# Patient Record
Sex: Female | Born: 1945
Health system: Southern US, Community
[De-identification: ages and names within clinical notes are randomized; demographics above are authoritative.]

## PROBLEM LIST (undated history)

## (undated) DIAGNOSIS — L570 Actinic keratosis: Secondary | ICD-10-CM

## (undated) DIAGNOSIS — R3915 Urgency of urination: Secondary | ICD-10-CM

## (undated) DIAGNOSIS — E785 Hyperlipidemia, unspecified: Secondary | ICD-10-CM

## (undated) DIAGNOSIS — C4491 Basal cell carcinoma of skin, unspecified: Secondary | ICD-10-CM

## (undated) DIAGNOSIS — S42302A Unspecified fracture of shaft of humerus, left arm, initial encounter for closed fracture: Secondary | ICD-10-CM

## (undated) DIAGNOSIS — I499 Cardiac arrhythmia, unspecified: Secondary | ICD-10-CM

## (undated) DIAGNOSIS — Z923 Personal history of irradiation: Secondary | ICD-10-CM

## (undated) DIAGNOSIS — M1990 Unspecified osteoarthritis, unspecified site: Secondary | ICD-10-CM

## (undated) DIAGNOSIS — Z87442 Personal history of urinary calculi: Secondary | ICD-10-CM

## (undated) DIAGNOSIS — C50919 Malignant neoplasm of unspecified site of unspecified female breast: Secondary | ICD-10-CM

## (undated) HISTORY — DX: Actinic keratosis: L57.0

## (undated) HISTORY — DX: Personal history of irradiation: Z92.3

## (undated) HISTORY — DX: Hyperlipidemia, unspecified: E78.5

## (undated) HISTORY — DX: Unspecified fracture of shaft of humerus, left arm, initial encounter for closed fracture: S42.302A

## (undated) HISTORY — DX: Basal cell carcinoma of skin, unspecified: C44.91

## (undated) HISTORY — DX: Urgency of urination: R39.15

## (undated) HISTORY — DX: Malignant neoplasm of unspecified site of unspecified female breast: C50.919

---

## 1967-11-01 DIAGNOSIS — S42302A Unspecified fracture of shaft of humerus, left arm, initial encounter for closed fracture: Secondary | ICD-10-CM

## 1967-11-01 HISTORY — DX: Unspecified fracture of shaft of humerus, left arm, initial encounter for closed fracture: S42.302A

## 1983-11-01 HISTORY — PX: VAGINAL HYSTERECTOMY: SUR661

## 1997-12-01 ENCOUNTER — Ambulatory Visit (HOSPITAL_COMMUNITY): Admission: RE | Admit: 1997-12-01 | Discharge: 1997-12-01 | Payer: Self-pay | Admitting: Family Medicine

## 1997-12-09 ENCOUNTER — Ambulatory Visit (HOSPITAL_COMMUNITY): Admission: RE | Admit: 1997-12-09 | Discharge: 1997-12-09 | Payer: Self-pay | Admitting: Family Medicine

## 1999-01-20 ENCOUNTER — Ambulatory Visit (HOSPITAL_COMMUNITY): Admission: RE | Admit: 1999-01-20 | Discharge: 1999-01-20 | Payer: Self-pay | Admitting: Family Medicine

## 2000-01-31 ENCOUNTER — Ambulatory Visit (HOSPITAL_COMMUNITY): Admission: RE | Admit: 2000-01-31 | Discharge: 2000-01-31 | Payer: Self-pay | Admitting: Family Medicine

## 2000-01-31 ENCOUNTER — Encounter: Payer: Self-pay | Admitting: Family Medicine

## 2001-02-08 ENCOUNTER — Encounter: Payer: Self-pay | Admitting: Family Medicine

## 2001-02-08 ENCOUNTER — Ambulatory Visit (HOSPITAL_COMMUNITY): Admission: RE | Admit: 2001-02-08 | Discharge: 2001-02-08 | Payer: Self-pay | Admitting: Family Medicine

## 2002-03-19 ENCOUNTER — Encounter: Payer: Self-pay | Admitting: Family Medicine

## 2002-03-19 ENCOUNTER — Ambulatory Visit (HOSPITAL_COMMUNITY): Admission: RE | Admit: 2002-03-19 | Discharge: 2002-03-19 | Payer: Self-pay | Admitting: Family Medicine

## 2003-03-07 ENCOUNTER — Ambulatory Visit (HOSPITAL_COMMUNITY): Admission: RE | Admit: 2003-03-07 | Discharge: 2003-03-07 | Payer: Self-pay | Admitting: Family Medicine

## 2003-03-07 ENCOUNTER — Encounter: Payer: Self-pay | Admitting: Family Medicine

## 2005-04-27 ENCOUNTER — Ambulatory Visit (HOSPITAL_COMMUNITY): Admission: RE | Admit: 2005-04-27 | Discharge: 2005-04-27 | Payer: Self-pay | Admitting: Family Medicine

## 2006-05-04 ENCOUNTER — Ambulatory Visit (HOSPITAL_COMMUNITY): Admission: RE | Admit: 2006-05-04 | Discharge: 2006-05-04 | Payer: Self-pay | Admitting: Family Medicine

## 2007-06-21 ENCOUNTER — Ambulatory Visit (HOSPITAL_COMMUNITY): Admission: RE | Admit: 2007-06-21 | Discharge: 2007-06-21 | Payer: Self-pay | Admitting: Family Medicine

## 2008-06-23 ENCOUNTER — Ambulatory Visit (HOSPITAL_COMMUNITY): Admission: RE | Admit: 2008-06-23 | Discharge: 2008-06-23 | Payer: Self-pay | Admitting: Family Medicine

## 2008-07-21 ENCOUNTER — Ambulatory Visit: Payer: Self-pay | Admitting: Unknown Physician Specialty

## 2009-05-11 DIAGNOSIS — C4491 Basal cell carcinoma of skin, unspecified: Secondary | ICD-10-CM

## 2009-05-11 HISTORY — DX: Basal cell carcinoma of skin, unspecified: C44.91

## 2009-08-04 ENCOUNTER — Ambulatory Visit (HOSPITAL_COMMUNITY): Admission: RE | Admit: 2009-08-04 | Discharge: 2009-08-04 | Payer: Self-pay | Admitting: Family Medicine

## 2010-09-09 ENCOUNTER — Ambulatory Visit (HOSPITAL_COMMUNITY): Admission: RE | Admit: 2010-09-09 | Discharge: 2010-09-09 | Payer: Self-pay | Admitting: Family Medicine

## 2011-11-01 DIAGNOSIS — Z923 Personal history of irradiation: Secondary | ICD-10-CM

## 2011-11-01 HISTORY — DX: Personal history of irradiation: Z92.3

## 2011-11-03 ENCOUNTER — Other Ambulatory Visit (HOSPITAL_COMMUNITY): Payer: Self-pay | Admitting: Family Medicine

## 2011-11-03 DIAGNOSIS — Z1231 Encounter for screening mammogram for malignant neoplasm of breast: Secondary | ICD-10-CM

## 2011-12-01 ENCOUNTER — Ambulatory Visit (HOSPITAL_COMMUNITY)
Admission: RE | Admit: 2011-12-01 | Discharge: 2011-12-01 | Disposition: A | Discharge status: Home / Self Care | Payer: BC Managed Care – PPO | Source: Ambulatory Visit | Attending: Family Medicine | Admitting: Family Medicine

## 2011-12-01 DIAGNOSIS — Z1231 Encounter for screening mammogram for malignant neoplasm of breast: Secondary | ICD-10-CM | POA: Insufficient documentation

## 2011-12-08 ENCOUNTER — Other Ambulatory Visit: Payer: Self-pay | Admitting: Family Medicine

## 2011-12-08 DIAGNOSIS — R928 Other abnormal and inconclusive findings on diagnostic imaging of breast: Secondary | ICD-10-CM

## 2011-12-15 ENCOUNTER — Other Ambulatory Visit: Payer: Self-pay | Admitting: Family Medicine

## 2011-12-15 ENCOUNTER — Ambulatory Visit
Admission: RE | Admit: 2011-12-15 | Discharge: 2011-12-15 | Disposition: A | Discharge status: Home / Self Care | Payer: BC Managed Care – PPO | Source: Ambulatory Visit | Attending: Family Medicine | Admitting: Family Medicine

## 2011-12-15 DIAGNOSIS — R928 Other abnormal and inconclusive findings on diagnostic imaging of breast: Secondary | ICD-10-CM

## 2011-12-15 HISTORY — PX: BREAST BIOPSY: SHX20

## 2011-12-16 ENCOUNTER — Other Ambulatory Visit: Payer: Self-pay | Admitting: Family Medicine

## 2011-12-16 DIAGNOSIS — C50912 Malignant neoplasm of unspecified site of left female breast: Secondary | ICD-10-CM

## 2011-12-21 ENCOUNTER — Ambulatory Visit
Admission: RE | Admit: 2011-12-21 | Discharge: 2011-12-21 | Disposition: A | Discharge status: Home / Self Care | Payer: BC Managed Care – PPO | Source: Ambulatory Visit | Attending: Family Medicine | Admitting: Family Medicine

## 2011-12-21 DIAGNOSIS — C50912 Malignant neoplasm of unspecified site of left female breast: Secondary | ICD-10-CM

## 2011-12-21 MED ORDER — GADOBENATE DIMEGLUMINE 529 MG/ML IV SOLN
16.0000 mL | Freq: Once | INTRAVENOUS | Status: AC | PRN
  Administered 2011-12-21: 16 mL via INTRAVENOUS

## 2011-12-26 ENCOUNTER — Ambulatory Visit (INDEPENDENT_AMBULATORY_CARE_PROVIDER_SITE_OTHER): Payer: BC Managed Care – PPO | Admitting: General Surgery

## 2011-12-26 ENCOUNTER — Encounter (INDEPENDENT_AMBULATORY_CARE_PROVIDER_SITE_OTHER): Payer: Self-pay | Admitting: General Surgery

## 2011-12-26 VITALS — BP 114/80 | HR 68 | Temp 97.4°F | Resp 16 | Ht 65.0 in | Wt 175.6 lb

## 2011-12-26 DIAGNOSIS — C50419 Malignant neoplasm of upper-outer quadrant of unspecified female breast: Secondary | ICD-10-CM

## 2011-12-26 NOTE — Progress Notes (Signed)
Patient ID: Krystal Payne, female   DOB: 11/20/1945, 66 y.o.   MRN: [id number]  Chief Complaint  Patient presents with  . Breast Cancer    eval L breast    HPI Krystal Payne is a 66 y.o. female.  Referred by Dr. Rubner HPI This is a healthy 66-year-old female who presents after undergoing her screening mammogram with a left breast mass that was found. This was 1 cm in size on followup diagnostic view. Ultrasound showed her to have a 1.1 x 0.8 x 0.7 cm mass. She underwent ultrasound-guided biopsy with clip placement. This ends up being a grade 1-2 invasive ductal carcinoma. This is HER-2/neu negative, ER positive at 100%, PR positive at 100%, and proliferation markers 6%. She has also undergone an MRI which shows a solitary 11 mm left breast posterior third invasive cancer. There is no MR evidence of malignancy in the right breast or any abnormal lymph nodes. There is a 9 mm left hepatic lobe lesion likely a benign hepatic cyst. She comes in today without any complaints referable to her breast to discuss different options for treatment. Past Medical History  Diagnosis Date  . Hyperlipidemia     Past Surgical History  Procedure Date  . Vaginal hysterectomy 1980    Family History  Problem Relation Age of Onset  . Heart disease Mother   . Heart disease Sister   . Cancer Brother     colon  . Heart disease Brother   . Cancer Maternal Aunt     breast    Social History History  Substance Use Topics  . Smoking status: Former Smoker    Quit date: 10/31/1968  . Smokeless tobacco: Not on file  . Alcohol Use: Yes    No Known Allergies  Current Outpatient Prescriptions  Medication Sig Dispense Refill  . oxybutynin (DITROPAN) 5 MG tablet daily.      . simvastatin (ZOCOR) 40 MG tablet 20 mg daily.        Review of Systems Review of Systems  Constitutional: Negative for fever, chills and unexpected weight change.  HENT: Negative for hearing loss, congestion, sore throat,  trouble swallowing and voice change.   Eyes: Negative for visual disturbance.  Respiratory: Negative for cough and wheezing.   Cardiovascular: Negative for chest pain, palpitations and leg swelling.  Gastrointestinal: Negative for nausea, vomiting, abdominal pain, diarrhea, constipation, blood in stool, abdominal distention and anal bleeding.  Genitourinary: Negative for hematuria, vaginal bleeding and difficulty urinating.  Musculoskeletal: Positive for arthralgias.  Skin: Negative for rash and wound.  Neurological: Negative for seizures, syncope and headaches.  Hematological: Negative for adenopathy. Does not bruise/bleed easily.  Psychiatric/Behavioral: Negative for confusion.    Blood pressure 114/80, pulse 68, temperature 97.4 F (36.3 C), temperature source Temporal, resp. rate 16, height 5' 5" (1.651 m), weight 175 lb 9.6 oz (79.652 kg).  Physical Exam Physical Exam  Vitals reviewed. Constitutional: She appears well-developed and well-nourished.  Neck: Neck supple.  Cardiovascular: Normal rate, regular rhythm and normal heart sounds.   Pulmonary/Chest: Effort normal and breath sounds normal. She has no wheezes. She has no rales. Right breast exhibits no inverted nipple, no mass, no nipple discharge, no skin change and no tenderness. Left breast exhibits no inverted nipple, no mass, no nipple discharge, no skin change and no tenderness. Breasts are symmetrical.    Abdominal: Soft. There is no hepatomegaly.  Lymphadenopathy:    She has no cervical adenopathy.    Data Reviewed BILATERAL BREAST   MRI WITH AND WITHOUT CONTRAST  Technique: Multiplanar, multisequence MR images of both breasts  were obtained prior to and following the intravenous administration  of 16ml of Multihance. Three dimensional images were evaluated at  the independent DynaCad workstation.  Comparison: Bilateral mammogram 12/01/2011, left diagnostic  mammogram, left breast ultrasound and, and post clip  mammogram  12/15/2011.  Findings: There is a minimal background enhancement pattern.  In the posterior third of the outer left breast, at the level of  the nipple, is a solitary 11 x 9 x 9 mm oval mass with indistinct  margins and washout kinetics. There is some surrounding biopsy  change. Biopsy clip artifact is seen centrally within the mass.  No additional areas of suspicious enhancement are identified in the  left breast.  No mass or suspicious enhancement is identified in the right breast  to suggest malignancy.  Negative for axillary or internal mammary chain lymphadenopathy.  In the left lobe of the liver is a circumscribed oval 9 mm T2  bright lesion that does not enhance. No prior abdominal imaging is  available for comparison.  IMPRESSION:  1. Solitary 11 mm biopsy-proven invasive mammary carcinoma in the  3 o'clock position of the left breast, posterior third.  2. No MRI evidence of malignancy in the right breast.  3. 9 mm T2 bright non-enhancing circumscribed left hepatic lobe  lesion. This is most likely a benign hepatic cyst. No prior  abdominal imaging available for comparison.   Assessment    Clinical stage I left breast cancer    Plan    We planned for left breast wire guided lumpectomy, left ax snbx, possible mammosite.  I think even though posterior third she would be reasonable candidate for mammosite and we discussed possibility of technically or due to pathology not being able to complete, long term seroma.  Will have her see xrt before scheduling.      We discussed the staging and pathophysiology of breast cancer. We discussed all of the different options for treatment for breast cancer including surgery, chemotherapy, radiation therapy, Herceptin, and antiestrogen therapy.   We discussed a sentinel lymph node biopsy as she does not appear to having lymph node involvement right now. We discussed the performance of that with injection of radioactive tracer  and blue dye. We discussed that she would have an incision underneath her axillary hairline. We discussed that there is a bout a 10-20% chance of having a positive node with a sentinel lymph node biopsy and we will await the permanent pathology to make any other first further decisions in terms of her treatment. One of these options might be to return to the operating room to perform an axillary lymph node dissection. We discussed about a 1-2% risk lifetime of chronic shoulder pain as well as lymphedema associated with a sentinel lymph node biopsy.  We discussed the options for treatment of the breast cancer which included lumpectomy versus a mastectomy. We discussed the performance of the lumpectomy with a wire placement. We discussed a 10-20% chance of a positive margin requiring reexcision in the operating room. We also discussed that she may need radiation therapy or antiestrogen therapy or both if she undergoes lumpectomy. We discussed the mastectomy and the postoperative care for that as well. We discussed that there is no difference in her survival whether she undergoes lumpectomy with radiation therapy or antiestrogen therapy versus a mastectomy. There is a slight difference in the local recurrence rate being 3-5% with lumpectomy and   about 1% with a mastectomy. We discussed the risks of operation including bleeding, infection, possible reoperation. She understands her further therapy will be based on what her stages at the time of her operation.      Burris Matherne 12/26/2011, 1:23 PM    

## 2011-12-27 ENCOUNTER — Other Ambulatory Visit (INDEPENDENT_AMBULATORY_CARE_PROVIDER_SITE_OTHER): Payer: Self-pay | Admitting: General Surgery

## 2011-12-28 ENCOUNTER — Telehealth (INDEPENDENT_AMBULATORY_CARE_PROVIDER_SITE_OTHER): Payer: Self-pay

## 2011-12-28 NOTE — Telephone Encounter (Signed)
Called [facility] to see about the pt being seen with Dr Dayton Scrape since Dr Michell Heinrich is out of the office and Dr Rennie Plowman staff is working on this right now. The pt might be seen tomorrow and they will call the pt ASAP as soon as they get a time.

## 2011-12-29 ENCOUNTER — Other Ambulatory Visit (INDEPENDENT_AMBULATORY_CARE_PROVIDER_SITE_OTHER): Payer: Self-pay | Admitting: General Surgery

## 2011-12-29 ENCOUNTER — Encounter (HOSPITAL_COMMUNITY): Payer: Self-pay | Admitting: Pharmacy Technician

## 2011-12-29 ENCOUNTER — Ambulatory Visit
Admission: RE | Admit: 2011-12-29 | Discharge: 2011-12-29 | Disposition: A | Discharge status: Home / Self Care | Payer: BC Managed Care – PPO | Source: Ambulatory Visit | Attending: Radiation Oncology | Admitting: Radiation Oncology

## 2011-12-29 ENCOUNTER — Encounter: Payer: Self-pay | Admitting: Radiation Oncology

## 2011-12-29 VITALS — BP 119/74 | HR 78 | Temp 97.6°F | Resp 18 | Ht 66.0 in | Wt 177.6 lb

## 2011-12-29 DIAGNOSIS — L988 Other specified disorders of the skin and subcutaneous tissue: Secondary | ICD-10-CM | POA: Insufficient documentation

## 2011-12-29 DIAGNOSIS — E785 Hyperlipidemia, unspecified: Secondary | ICD-10-CM | POA: Insufficient documentation

## 2011-12-29 DIAGNOSIS — C50419 Malignant neoplasm of upper-outer quadrant of unspecified female breast: Secondary | ICD-10-CM

## 2011-12-29 DIAGNOSIS — Z90710 Acquired absence of both cervix and uterus: Secondary | ICD-10-CM | POA: Insufficient documentation

## 2011-12-29 DIAGNOSIS — C50919 Malignant neoplasm of unspecified site of unspecified female breast: Secondary | ICD-10-CM | POA: Insufficient documentation

## 2011-12-29 DIAGNOSIS — Z79899 Other long term (current) drug therapy: Secondary | ICD-10-CM | POA: Insufficient documentation

## 2011-12-29 DIAGNOSIS — Z87891 Personal history of nicotine dependence: Secondary | ICD-10-CM | POA: Insufficient documentation

## 2011-12-29 DIAGNOSIS — Z170 Estrogen receptor positive status [ER+]: Secondary | ICD-10-CM | POA: Insufficient documentation

## 2011-12-29 DIAGNOSIS — Z803 Family history of malignant neoplasm of breast: Secondary | ICD-10-CM | POA: Insufficient documentation

## 2011-12-29 DIAGNOSIS — Z510 Encounter for antineoplastic radiation therapy: Secondary | ICD-10-CM | POA: Insufficient documentation

## 2011-12-29 NOTE — Progress Notes (Signed)
[facility] Radiation Oncology NEW PATIENT EVALUATION  Name: Krystal Payne MRN: [id number]  Date: 12/29/2011  DOB: [birth year]  Status: outpatient   CC: Dorothey Baseman, MD, MD  Emelia Loron, MD    REFERRING PHYSICIAN: Emelia Loron, MD   DIAGNOSIS: Clinical stage I (T1b, N0, M0) invasive ductal carcinoma of the left breast   HISTORY OF PRESENT ILLNESS:  Krystal Payne is a 66 y.o. female who is seen today through the courtesy of Dr. Emelia Loron for consideration of partial breast radiation/MammoSite HDR following conservative surgery for her T1 N0 invasive ductal carcinoma of the left breast. At the time of a screening mammogram at the [facility] hospital on 12/01/2011 Susana have a possible mass within the left breast. Additional views and ultrasound at the breast Center on 12/15/2011 showed a 1.1 x 0.8 x 0.7 cm mass at 3:00, 7 cm from the nipple. An ultrasound guided biopsy on 12/15/2011 was diagnostic for invasive ductal carcinoma which was strongly ER/PR positive at 100% with a low proliferation marker of 6%. HER-2/neu was not amplified. Best MR on 12/21/2011 showed a 1.1 x 0.9 x 0.9 cm mass within the posterior third of the abdomen left breast. Biopsy change and clip artifact were seen. There were no other suspicious lesions. She is interested in MammoSite HDR brachytherapy.   PREVIOUS RADIATION THERAPY: No   PAST MEDICAL HISTORY:  has a past medical history of Hyperlipidemia and Breast cancer.     PAST SURGICAL HISTORY:  Past Surgical History  Procedure Date  . Vaginal hysterectomy 1980  . Breast biopsy     left     FAMILY HISTORY: family history includes Cancer in her brother and maternal aunt and Heart disease in her brother, mother, and sister. Her mother died from sepsis following a CVA at age 51. Her father died from complications of Alzheimer's disease at 43. A maternal aunt was diagnosed with breast cancer at age 13, and is currently  alive and well at age 43.   SOCIAL HISTORY:  reports that she quit smoking about 43 years ago. Her smoking use included Cigarettes. She has a .5 pack-year smoking history. She does not have any smokeless tobacco history on file. She reports that she drinks alcohol. She reports that she does not use illicit drugs. She worked as a IT consultant. Married, 2 children.   ALLERGIES: Review of patient's allergies indicates no known allergies.   MEDICATIONS:  Current Outpatient Prescriptions  Medication Sig Dispense Refill  . oxybutynin (DITROPAN) 5 MG tablet daily.      . simvastatin (ZOCOR) 40 MG tablet 20 mg daily.          REVIEW OF SYSTEMS:  Pertinent items are noted in HPI.    PHYSICAL EXAM:  height is 5\' 6"  (1.676 m) and weight is 177 lb 9.6 oz (80.559 kg). Her oral temperature is 97.6 F (36.4 C). Her blood pressure is 119/74 and her pulse is 78. Her respiration is 18 and oxygen saturation is 100%.   Head and neck examination: Grossly unremarkable. Nodes: Without palpable cervical, supraclavicular, or axillary lymphadenopathy. Chest: Lungs clear. Back: Without spinal or CVA tenderness. Heart regular in rhythm. Breasts: There is a punctate needle biopsy wound at approximately 3:00 along the left breast. There is ill-defined was in her mass also at 3:00 more medial and deep within the left breast. Right breast is without masses or lesions. Abdomen without hepatomegaly. Extremities without edema.   LABORATORY DATA:  No results found for this basename:  WBC, HGB, HCT, MCV, PLT   No results found for this basename: NA, K, CL, CO2   No results found for this basename: ALT, AST, GGT, ALKPHOS, BILITOT      IMPRESSION: Clinical stage I (T1b N0 M0) invasive ductal carcinoma of the left breast. I explained to the patient that her local treatment options include mastectomy versus partial mastectomy followed by radiation therapy. Radiation therapy options include conventional whole breast radiation  or partial breast radiation. Partial breast radiation options include MammoSite HDR brachytherapy. On review of her MRI scan, she appears to be an excellent candidate for MammoSite brachytherapy based on location of her primary tumor. We discussed MammoSite brachytherapy in great detail and  the potential acute and late toxicities.Consent was signed today. I spoke with Dr. Dwain Sarna will get her scheduled for surgery.  PLAN: As discussed above. She is also given literature and a DVD for review.   I spent 60 minutes minutes face to face with the patient and more than 50% of that time was spent in counseling and/or coordination of care.

## 2011-12-29 NOTE — Progress Notes (Signed)
66 year old female.   Annual screening mammogram revealed abnormality in left breast. Ultrasound showed 1.1x0.8x0.7 cm mass. Underwent ultrasound biopsy and clip placement on 12/15/2011. Pathology revealed grade 1-2 invasive ductal carcinoma ER, PR positive and HER-2 negative. MRI done 12/21/2011 shows a solitary 11 mm left breast posterior third invasive cancer. No evidence of malignancy in right breast. 9 mm left hepatic lesion likely benign. Left breast wire lumpectomy with SLN evaluation and possible mammosite planned.   NKDA No indication of a pacemaker No hx of XRT

## 2011-12-29 NOTE — Progress Notes (Signed)
Please see the Nurse Progress Note in the MD Initial Consult Encounter for this patient. 

## 2011-12-29 NOTE — Progress Notes (Signed)
Complete PATIENT MEASURE OF DISTRESS worksheet with a score of 0 submitted to social work. Also, complete NUTRITION RISK SCREEN worksheet without concerns submitted to Barbara Neff, RD 

## 2011-12-29 NOTE — Progress Notes (Signed)
Chart note:  Dr. Dwain Sarna has the patient scheduled for surgery and placement of a CED on Monday, March 4. I'm getting her scheduled for her CED check on Tuesday, March 5. Assuming that she remains a candidate for MammoSite HDR brachytherapy, Dr. Dwain Sarna can change the CED for the MammoSite catheter on Wednesday, March 6. We'll then have her visit [place] for formal simulation/treatment planning on Thursday, March 7 and begin her treatment either Friday, March 8 or Monday, March 11 with completion of therapy on Friday, March 15.

## 2011-12-29 NOTE — Progress Notes (Signed)
Patient presents to the clinic today for a consultation with Dr. Dayton Scrape reference XRT for breast ca. Patient is alert and oriented to person, place, and time. No distress noted. Steady gait noted. Pleasant affect noted. Patient denies pain at this time. Patient has no complaints. Patient states,"I don't even feel sick." Patient denies nausea, vomiting, diarrhea, headache, or dizziness. Reported all findings to Dr. Dayton Scrape.

## 2011-12-29 NOTE — Progress Notes (Signed)
Encounter addended by: Ardell Isaacs, RN on: 12/29/2011 12:35 PM<BR>     Documentation filed: Charges VN, Inpatient Patient Education

## 2011-12-30 ENCOUNTER — Encounter (HOSPITAL_COMMUNITY): Payer: Self-pay | Admitting: *Deleted

## 2012-01-01 MED ORDER — CEFAZOLIN SODIUM 1-5 GM-% IV SOLN: 1.0000 g | INTRAVENOUS | Status: DC

## 2012-01-02 ENCOUNTER — Ambulatory Visit
Admission: RE | Admit: 2012-01-02 | Discharge: 2012-01-02 | Disposition: A | Discharge status: Home / Self Care | Payer: BC Managed Care – PPO | Source: Ambulatory Visit | Attending: General Surgery | Admitting: General Surgery

## 2012-01-02 ENCOUNTER — Other Ambulatory Visit: Payer: Self-pay

## 2012-01-02 ENCOUNTER — Ambulatory Visit (HOSPITAL_COMMUNITY): Payer: BC Managed Care – PPO | Admitting: Anesthesiology

## 2012-01-02 ENCOUNTER — Ambulatory Visit (HOSPITAL_COMMUNITY)
Admission: RE | Admit: 2012-01-02 | Discharge: 2012-01-02 | Disposition: A | Discharge status: Home / Self Care | Payer: BC Managed Care – PPO | Source: Ambulatory Visit | Attending: General Surgery | Admitting: General Surgery

## 2012-01-02 ENCOUNTER — Encounter (HOSPITAL_COMMUNITY)
Admission: RE | Disposition: A | Discharge status: Home / Self Care | Payer: Self-pay | Source: Ambulatory Visit | Attending: General Surgery

## 2012-01-02 ENCOUNTER — Encounter (HOSPITAL_COMMUNITY): Payer: Self-pay | Admitting: *Deleted

## 2012-01-02 ENCOUNTER — Encounter (HOSPITAL_COMMUNITY): Payer: Self-pay | Admitting: Anesthesiology

## 2012-01-02 ENCOUNTER — Other Ambulatory Visit (INDEPENDENT_AMBULATORY_CARE_PROVIDER_SITE_OTHER): Payer: Self-pay | Admitting: General Surgery

## 2012-01-02 ENCOUNTER — Ambulatory Visit (HOSPITAL_COMMUNITY): Payer: BC Managed Care – PPO

## 2012-01-02 DIAGNOSIS — C50419 Malignant neoplasm of upper-outer quadrant of unspecified female breast: Secondary | ICD-10-CM

## 2012-01-02 DIAGNOSIS — Z170 Estrogen receptor positive status [ER+]: Secondary | ICD-10-CM | POA: Insufficient documentation

## 2012-01-02 DIAGNOSIS — C50919 Malignant neoplasm of unspecified site of unspecified female breast: Secondary | ICD-10-CM

## 2012-01-02 DIAGNOSIS — C50912 Malignant neoplasm of unspecified site of left female breast: Secondary | ICD-10-CM

## 2012-01-02 DIAGNOSIS — E785 Hyperlipidemia, unspecified: Secondary | ICD-10-CM | POA: Insufficient documentation

## 2012-01-02 HISTORY — PX: BREAST LUMPECTOMY: SHX2

## 2012-01-02 LAB — CBC
MCH: 31 pg (ref 26.0–34.0)
MCHC: 33.9 g/dL (ref 30.0–36.0)
Platelets: 171 10*3/uL (ref 150–400)
RDW: 12.6 % (ref 11.5–15.5)

## 2012-01-02 LAB — COMPREHENSIVE METABOLIC PANEL
ALT: 18 U/L (ref 0–35)
AST: 17 U/L (ref 0–37)
Calcium: 9.6 mg/dL (ref 8.4–10.5)
Sodium: 142 mEq/L (ref 135–145)
Total Protein: 7.1 g/dL (ref 6.0–8.3)

## 2012-01-02 LAB — SURGICAL PCR SCREEN: Staphylococcus aureus: NEGATIVE

## 2012-01-02 SURGERY — BREAST LUMPECTOMY WITH NEEDLE LOCALIZATION AND AXILLARY SENTINEL LYMPH NODE BX
Anesthesia: General | Site: Breast | Laterality: Left | Wound class: Clean

## 2012-01-02 MED ORDER — ACETAMINOPHEN 650 MG RE SUPP: 650.0000 mg | RECTAL | Status: DC | PRN

## 2012-01-02 MED ORDER — MORPHINE SULFATE 2 MG/ML IJ SOLN: 2.0000 mg | INTRAMUSCULAR | Status: DC | PRN

## 2012-01-02 MED ORDER — TECHNETIUM TC 99M SULFUR COLLOID FILTERED
1.0000 | Freq: Once | INTRAVENOUS | Status: AC | PRN
  Administered 2012-01-02: 1 via INTRADERMAL

## 2012-01-02 MED ORDER — IOHEXOL 300 MG/ML  SOLN
INTRAMUSCULAR | Status: DC | PRN
  Administered 2012-01-02: 10 mL via INTRAVENOUS

## 2012-01-02 MED ORDER — FENTANYL CITRATE 0.05 MG/ML IJ SOLN
INTRAMUSCULAR | Status: AC
  Filled 2012-01-02: qty 2

## 2012-01-02 MED ORDER — ONDANSETRON HCL 4 MG/2ML IJ SOLN: 4.0000 mg | Freq: Four times a day (QID) | INTRAMUSCULAR | Status: DC | PRN

## 2012-01-02 MED ORDER — SODIUM CHLORIDE 0.9 % IJ SOLN
INTRAMUSCULAR | Status: DC | PRN
  Administered 2012-01-02: 11:00:00

## 2012-01-02 MED ORDER — GLYCOPYRROLATE 0.2 MG/ML IJ SOLN
INTRAMUSCULAR | Status: DC | PRN
  Administered 2012-01-02: .6 mg via INTRAVENOUS

## 2012-01-02 MED ORDER — FENTANYL CITRATE 0.05 MG/ML IJ SOLN
INTRAMUSCULAR | Status: DC | PRN
  Administered 2012-01-02 (×2): 50 ug via INTRAVENOUS
  Administered 2012-01-02: 100 ug via INTRAVENOUS
  Administered 2012-01-02 (×2): 50 ug via INTRAVENOUS
  Administered 2012-01-02: 25 ug via INTRAVENOUS

## 2012-01-02 MED ORDER — OXYCODONE-ACETAMINOPHEN 5-325 MG PO TABS: 1.0000 | ORAL_TABLET | Freq: Four times a day (QID) | ORAL | Status: DC | PRN

## 2012-01-02 MED ORDER — 0.9 % SODIUM CHLORIDE (POUR BTL) OPTIME
TOPICAL | Status: DC | PRN
  Administered 2012-01-02: 1000 mL

## 2012-01-02 MED ORDER — MIDAZOLAM HCL 5 MG/5ML IJ SOLN
INTRAMUSCULAR | Status: DC | PRN
  Administered 2012-01-02: 2 mg via INTRAVENOUS

## 2012-01-02 MED ORDER — ROCURONIUM BROMIDE 100 MG/10ML IV SOLN
INTRAVENOUS | Status: DC | PRN
  Administered 2012-01-02: 40 mg via INTRAVENOUS

## 2012-01-02 MED ORDER — SODIUM CHLORIDE 0.9 % IJ SOLN: 3.0000 mL | Freq: Two times a day (BID) | INTRAMUSCULAR | Status: DC

## 2012-01-02 MED ORDER — ONDANSETRON HCL 4 MG/2ML IJ SOLN
INTRAMUSCULAR | Status: DC | PRN
  Administered 2012-01-02: 4 mg via INTRAVENOUS

## 2012-01-02 MED ORDER — ACETAMINOPHEN 325 MG PO TABS: 650.0000 mg | ORAL_TABLET | ORAL | Status: DC | PRN

## 2012-01-02 MED ORDER — CEFAZOLIN SODIUM-DEXTROSE 2-3 GM-% IV SOLR
2.0000 g | INTRAVENOUS | Status: AC
  Administered 2012-01-02: 2 g via INTRAVENOUS

## 2012-01-02 MED ORDER — MIDAZOLAM HCL 2 MG/2ML IJ SOLN
INTRAMUSCULAR | Status: AC
Start: 2012-01-02 — End: 2012-01-02
  Filled 2012-01-02: qty 2

## 2012-01-02 MED ORDER — HYDROMORPHONE HCL PF 1 MG/ML IJ SOLN
0.2500 mg | INTRAMUSCULAR | Status: DC | PRN
Start: 2012-01-02 — End: 2012-01-02
  Administered 2012-01-02: 0.5 mg via INTRAVENOUS

## 2012-01-02 MED ORDER — ONDANSETRON HCL 4 MG/2ML IJ SOLN: 4.0000 mg | Freq: Once | INTRAMUSCULAR | Status: DC | PRN

## 2012-01-02 MED ORDER — CEFAZOLIN SODIUM-DEXTROSE 2-3 GM-% IV SOLR
INTRAVENOUS | Status: AC
  Filled 2012-01-02: qty 50

## 2012-01-02 MED ORDER — SODIUM CHLORIDE 0.9 % IJ SOLN: 3.0000 mL | INTRAMUSCULAR | Status: DC | PRN

## 2012-01-02 MED ORDER — MUPIROCIN 2 % EX OINT
TOPICAL_OINTMENT | Freq: Two times a day (BID) | CUTANEOUS | Status: DC
  Administered 2012-01-02: 1 via NASAL
  Filled 2012-01-02: qty 22

## 2012-01-02 MED ORDER — MUPIROCIN 2 % EX OINT
TOPICAL_OINTMENT | CUTANEOUS | Status: AC
  Administered 2012-01-02: 1 via NASAL
  Filled 2012-01-02: qty 22

## 2012-01-02 MED ORDER — NEOSTIGMINE METHYLSULFATE 1 MG/ML IJ SOLN
INTRAMUSCULAR | Status: DC | PRN
  Administered 2012-01-02: 4 mg via INTRAVENOUS

## 2012-01-02 MED ORDER — OXYCODONE HCL 5 MG PO TABS: 5.0000 mg | ORAL_TABLET | ORAL | Status: DC | PRN

## 2012-01-02 MED ORDER — SODIUM CHLORIDE 0.9 % IV SOLN: 250.0000 mL | INTRAVENOUS | Status: DC | PRN

## 2012-01-02 MED ORDER — LACTATED RINGERS IV SOLN
INTRAVENOUS | Status: DC
  Administered 2012-01-02: 10:00:00 via INTRAVENOUS

## 2012-01-02 MED ORDER — LACTATED RINGERS IV SOLN
INTRAVENOUS | Status: DC | PRN
  Administered 2012-01-02: 10:00:00 via INTRAVENOUS

## 2012-01-02 MED ORDER — PROPOFOL 10 MG/ML IV EMUL
INTRAVENOUS | Status: DC | PRN
  Administered 2012-01-02: 200 mg via INTRAVENOUS
  Administered 2012-01-02: 50 mg via INTRAVENOUS

## 2012-01-02 MED ORDER — SODIUM CHLORIDE 0.9 % IV SOLN: INTRAVENOUS | Status: DC

## 2012-01-02 SURGICAL SUPPLY — 55 items
APPLIER CLIP 9.375 MED OPEN (MISCELLANEOUS) ×2
BENZOIN TINCTURE PRP APPL 2/3 (GAUZE/BANDAGES/DRESSINGS) ×2 IMPLANT
BINDER BREAST LRG (GAUZE/BANDAGES/DRESSINGS) IMPLANT
BINDER BREAST XLRG (GAUZE/BANDAGES/DRESSINGS) ×2 IMPLANT
BLADE SURG 10 STRL SS (BLADE) ×2 IMPLANT
BLADE SURG 15 STRL LF DISP TIS (BLADE) ×1 IMPLANT
BLADE SURG 15 STRL SS (BLADE) ×1
CANISTER SUCTION 2500CC (MISCELLANEOUS) ×2 IMPLANT
CHLORAPREP W/TINT 26ML (MISCELLANEOUS) ×2 IMPLANT
CLIP APPLIE 9.375 MED OPEN (MISCELLANEOUS) ×1 IMPLANT
CLOTH BEACON ORANGE TIMEOUT ST (SAFETY) ×2 IMPLANT
CONT SPEC STER OR (MISCELLANEOUS) ×2 IMPLANT
COVER PROBE W GEL 5X96 (DRAPES) ×2 IMPLANT
COVER SURGICAL LIGHT HANDLE (MISCELLANEOUS) ×2 IMPLANT
DECANTER SPIKE VIAL GLASS SM (MISCELLANEOUS) IMPLANT
DERMABOND ADVANCED (GAUZE/BANDAGES/DRESSINGS)
DERMABOND ADVANCED .7 DNX12 (GAUZE/BANDAGES/DRESSINGS) IMPLANT
DEVICE CAVITY EVALUATION 9031 (MISCELLANEOUS) ×2 IMPLANT
DEVICE DUBIN SPECIMEN MAMMOGRA (MISCELLANEOUS) ×2 IMPLANT
DRAPE CHEST BREAST 15X10 FENES (DRAPES) ×2 IMPLANT
DRSG TEGADERM 4X4.75 (GAUZE/BANDAGES/DRESSINGS) ×4 IMPLANT
ELECT CAUTERY BLADE 6.4 (BLADE) ×2 IMPLANT
ELECT REM PT RETURN 9FT ADLT (ELECTROSURGICAL) ×2
ELECTRODE REM PT RTRN 9FT ADLT (ELECTROSURGICAL) ×1 IMPLANT
GLOVE BIO SURGEON STRL SZ7 (GLOVE) ×6 IMPLANT
GLOVE BIOGEL PI IND STRL 7.0 (GLOVE) ×1 IMPLANT
GLOVE BIOGEL PI IND STRL 7.5 (GLOVE) ×1 IMPLANT
GLOVE BIOGEL PI INDICATOR 7.0 (GLOVE) ×1
GLOVE BIOGEL PI INDICATOR 7.5 (GLOVE) ×1
GOWN STRL NON-REIN LRG LVL3 (GOWN DISPOSABLE) ×6 IMPLANT
KIT BASIN OR (CUSTOM PROCEDURE TRAY) ×2 IMPLANT
KIT MARKER MARGIN INK (KITS) ×2 IMPLANT
KIT ROOM TURNOVER OR (KITS) ×2 IMPLANT
NEEDLE 18GX1X1/2 (RX/OR ONLY) (NEEDLE) ×2 IMPLANT
NEEDLE HYPO 25GX1X1/2 BEV (NEEDLE) ×4 IMPLANT
NS IRRIG 1000ML POUR BTL (IV SOLUTION) ×2 IMPLANT
PACK SURGICAL SETUP 50X90 (CUSTOM PROCEDURE TRAY) ×2 IMPLANT
PAD ARMBOARD 7.5X6 YLW CONV (MISCELLANEOUS) ×2 IMPLANT
PENCIL BUTTON HOLSTER BLD 10FT (ELECTRODE) ×2 IMPLANT
SPONGE GAUZE 4X4 12PLY (GAUZE/BANDAGES/DRESSINGS) ×2 IMPLANT
SPONGE LAP 18X18 X RAY DECT (DISPOSABLE) ×2 IMPLANT
STAPLER VISISTAT 35W (STAPLE) ×2 IMPLANT
STOCKINETTE IMPERVIOUS 9X36 MD (GAUZE/BANDAGES/DRESSINGS) IMPLANT
STRIP CLOSURE SKIN 1/2X4 (GAUZE/BANDAGES/DRESSINGS) ×2 IMPLANT
SUT MNCRL AB 4-0 PS2 18 (SUTURE) ×4 IMPLANT
SUT SILK 2 0 SH (SUTURE) IMPLANT
SUT VIC AB 2-0 SH 27 (SUTURE) ×4
SUT VIC AB 2-0 SH 27XBRD (SUTURE) ×4 IMPLANT
SUT VIC AB 3-0 SH 27 (SUTURE) ×2
SUT VIC AB 3-0 SH 27XBRD (SUTURE) ×2 IMPLANT
SYR CONTROL 10ML LL (SYRINGE) ×4 IMPLANT
TOWEL OR 17X24 6PK STRL BLUE (TOWEL DISPOSABLE) ×2 IMPLANT
TOWEL OR 17X26 10 PK STRL BLUE (TOWEL DISPOSABLE) ×2 IMPLANT
TUBE CONNECTING 12X1/4 (SUCTIONS) ×2 IMPLANT
YANKAUER SUCT BULB TIP NO VENT (SUCTIONS) ×2 IMPLANT

## 2012-01-02 NOTE — Transfer of Care (Signed)
Immediate Anesthesia Transfer of Care Note  Patient: Krystal Payne  Procedure(s) Performed: Procedure(s) (LRB): BREAST LUMPECTOMY WITH NEEDLE LOCALIZATION AND AXILLARY SENTINEL LYMPH NODE BX (Left)  Patient Location: PACU  Anesthesia Type: General  Level of Consciousness: awake, alert  and oriented  Airway & Oxygen Therapy: Patient Spontanous Breathing and Patient connected to nasal cannula oxygen  Post-op Assessment: Report given to PACU RN, Post -op Vital signs reviewed and stable and Patient moving all extremities  Post vital signs: Reviewed and stable  Complications: No apparent anesthesia complications

## 2012-01-02 NOTE — Anesthesia Preprocedure Evaluation (Signed)
Anesthesia Evaluation  Patient identified by MRN, date of birth, ID band Patient awake    Reviewed: Allergy & Precautions, H&P , NPO status , Patient's Chart, lab work & pertinent test results  Airway Mallampati: I TM Distance: >3 FB Neck ROM: Full    Dental  (+) Teeth Intact   Pulmonary  breath sounds clear to auscultation        Cardiovascular negative cardio ROS  Rhythm:Regular Rate:Normal     Neuro/Psych    GI/Hepatic negative GI ROS,   Endo/Other    Renal/GU      Musculoskeletal   Abdominal   Peds  Hematology   Anesthesia Other Findings   Reproductive/Obstetrics                           Anesthesia Physical Anesthesia Plan  ASA: III  Anesthesia Plan: General   Post-op Pain Management:    Induction: Intravenous  Airway Management Planned: LMA  Additional Equipment:   Intra-op Plan:   Post-operative Plan:   Informed Consent: I have reviewed the patients History and Physical, chart, labs and discussed the procedure including the risks, benefits and alternatives for the proposed anesthesia with the patient or authorized representative who has indicated his/her understanding and acceptance.   Dental advisory given  Plan Discussed with:   Anesthesia Plan Comments: (Plan GA with LMA  Wynetta Emery, MD)        Anesthesia Quick Evaluation

## 2012-01-02 NOTE — H&P (View-Only) (Signed)
Patient ID: Krystal Payne, female   DOB: 1946-07-20, 66 y.o.   MRN: [id number]  Chief Complaint  Patient presents with  . Breast Cancer    eval L breast    HPI Krystal Payne is a 66 y.o. female.  Referred by Dr. Roswell Nickel HPI This is a healthy 66 year old female who presents after undergoing her screening mammogram with a left breast mass that was found. This was 1 cm in size on followup diagnostic view. Ultrasound showed her to have a 1.1 x 0.8 x 0.7 cm mass. She underwent ultrasound-guided biopsy with clip placement. This ends up being a grade 1-2 invasive ductal carcinoma. This is HER-2/neu negative, ER positive at 100%, PR positive at 100%, and proliferation markers 6%. She has also undergone an MRI which shows a solitary 11 mm left breast posterior third invasive cancer. There is no MR evidence of malignancy in the right breast or any abnormal lymph nodes. There is a 9 mm left hepatic lobe lesion likely a benign hepatic cyst. She comes in today without any complaints referable to her breast to discuss different options for treatment. Past Medical History  Diagnosis Date  . Hyperlipidemia     Past Surgical History  Procedure Date  . Vaginal hysterectomy 1980    Family History  Problem Relation Age of Onset  . Heart disease Mother   . Heart disease Sister   . Cancer Brother     colon  . Heart disease Brother   . Cancer Maternal Aunt     breast    Social History History  Substance Use Topics  . Smoking status: Former Smoker    Quit date: 10/31/1968  . Smokeless tobacco: Not on file  . Alcohol Use: Yes    No Known Allergies  Current Outpatient Prescriptions  Medication Sig Dispense Refill  . oxybutynin (DITROPAN) 5 MG tablet daily.      . simvastatin (ZOCOR) 40 MG tablet 20 mg daily.        Review of Systems Review of Systems  Constitutional: Negative for fever, chills and unexpected weight change.  HENT: Negative for hearing loss, congestion, sore throat,  trouble swallowing and voice change.   Eyes: Negative for visual disturbance.  Respiratory: Negative for cough and wheezing.   Cardiovascular: Negative for chest pain, palpitations and leg swelling.  Gastrointestinal: Negative for nausea, vomiting, abdominal pain, diarrhea, constipation, blood in stool, abdominal distention and anal bleeding.  Genitourinary: Negative for hematuria, vaginal bleeding and difficulty urinating.  Musculoskeletal: Positive for arthralgias.  Skin: Negative for rash and wound.  Neurological: Negative for seizures, syncope and headaches.  Hematological: Negative for adenopathy. Does not bruise/bleed easily.  Psychiatric/Behavioral: Negative for confusion.    Blood pressure 114/80, pulse 68, temperature 97.4 F (36.3 C), temperature source Temporal, resp. rate 16, height 5\' 5"  (1.651 m), weight 175 lb 9.6 oz (79.652 kg).  Physical Exam Physical Exam  Vitals reviewed. Constitutional: She appears well-developed and well-nourished.  Neck: Neck supple.  Cardiovascular: Normal rate, regular rhythm and normal heart sounds.   Pulmonary/Chest: Effort normal and breath sounds normal. She has no wheezes. She has no rales. Right breast exhibits no inverted nipple, no mass, no nipple discharge, no skin change and no tenderness. Left breast exhibits no inverted nipple, no mass, no nipple discharge, no skin change and no tenderness. Breasts are symmetrical.    Abdominal: Soft. There is no hepatomegaly.  Lymphadenopathy:    She has no cervical adenopathy.    Data Reviewed BILATERAL BREAST  MRI WITH AND WITHOUT CONTRAST  Technique: Multiplanar, multisequence MR images of both breasts  were obtained prior to and following the intravenous administration  of 16ml of Multihance. Three dimensional images were evaluated at  the independent DynaCad workstation.  Comparison: Bilateral mammogram 12/01/2011, left diagnostic  mammogram, left breast ultrasound and, and post clip  mammogram  12/15/2011.  Findings: There is a minimal background enhancement pattern.  In the posterior third of the outer left breast, at the level of  the nipple, is a solitary 11 x 9 x 9 mm oval mass with indistinct  margins and washout kinetics. There is some surrounding biopsy  change. Biopsy clip artifact is seen centrally within the mass.  No additional areas of suspicious enhancement are identified in the  left breast.  No mass or suspicious enhancement is identified in the right breast  to suggest malignancy.  Negative for axillary or internal mammary chain lymphadenopathy.  In the left lobe of the liver is a circumscribed oval 9 mm T2  bright lesion that does not enhance. No prior abdominal imaging is  available for comparison.  IMPRESSION:  1. Solitary 11 mm biopsy-proven invasive mammary carcinoma in the  3 o'clock position of the left breast, posterior third.  2. No MRI evidence of malignancy in the right breast.  3. 9 mm T2 bright non-enhancing circumscribed left hepatic lobe  lesion. This is most likely a benign hepatic cyst. No prior  abdominal imaging available for comparison.   Assessment    Clinical stage I left breast cancer    Plan    We planned for left breast wire guided lumpectomy, left ax snbx, possible mammosite.  I think even though posterior third she would be reasonable candidate for mammosite and we discussed possibility of technically or due to pathology not being able to complete, long term seroma.  Will have her see xrt before scheduling.      We discussed the staging and pathophysiology of breast cancer. We discussed all of the different options for treatment for breast cancer including surgery, chemotherapy, radiation therapy, Herceptin, and antiestrogen therapy.   We discussed a sentinel lymph node biopsy as she does not appear to having lymph node involvement right now. We discussed the performance of that with injection of radioactive tracer  and blue dye. We discussed that she would have an incision underneath her axillary hairline. We discussed that there is a bout a 10-20% chance of having a positive node with a sentinel lymph node biopsy and we will await the permanent pathology to make any other first further decisions in terms of her treatment. One of these options might be to return to the operating room to perform an axillary lymph node dissection. We discussed about a 1-2% risk lifetime of chronic shoulder pain as well as lymphedema associated with a sentinel lymph node biopsy.  We discussed the options for treatment of the breast cancer which included lumpectomy versus a mastectomy. We discussed the performance of the lumpectomy with a wire placement. We discussed a 10-20% chance of a positive margin requiring reexcision in the operating room. We also discussed that she may need radiation therapy or antiestrogen therapy or both if she undergoes lumpectomy. We discussed the mastectomy and the postoperative care for that as well. We discussed that there is no difference in her survival whether she undergoes lumpectomy with radiation therapy or antiestrogen therapy versus a mastectomy. There is a slight difference in the local recurrence rate being 3-5% with lumpectomy and  about 1% with a mastectomy. We discussed the risks of operation including bleeding, infection, possible reoperation. She understands her further therapy will be based on what her stages at the time of her operation.      Isahia Hollerbach 12/26/2011, 1:23 PM

## 2012-01-02 NOTE — Interval H&P Note (Signed)
History and Physical Interval Note:  01/02/2012 9:54 AM  Krystal Payne  has presented today for surgery, with the diagnosis of left breast cancer  The various methods of treatment have been discussed with the patient and family. After consideration of risks, benefits and other options for treatment, the patient has consented to  Procedure(s) (LRB): Left breast wire guided lumpectomy, left axillary sentinel node biopsy, CED placement as a surgical intervention .  The patients' history has been reviewed, patient examined, no change in status, stable for surgery.  I have reviewed the patients' chart and labs.  Questions were answered to the patient's satisfaction.     Rodgers Likes

## 2012-01-02 NOTE — Anesthesia Procedure Notes (Signed)
Procedure Name: Intubation Performed by: Armandina Gemma MARIE Pre-anesthesia Checklist: Patient identified, Emergency Drugs available, Suction available, Patient being monitored and Timeout performed Patient Re-evaluated:Patient Re-evaluated prior to inductionOxygen Delivery Method: Circle system utilized Preoxygenation: Pre-oxygenation with 100% oxygen Intubation Type: Combination inhalational/ intravenous induction Ventilation: Mask ventilation without difficulty Laryngoscope Size: Miller and 2 Grade View: Grade I Tube type: Oral Tube size: 7.0 mm Number of attempts: 1 Airway Equipment and Method: Stylet and Bite block Placement Confirmation: ETT inserted through vocal cords under direct vision,  positive ETCO2,  CO2 detector and breath sounds checked- equal and bilateral Secured at: 22 cm Tube secured with: Tape Dental Injury: Teeth and Oropharynx as per pre-operative assessment

## 2012-01-02 NOTE — Discharge Instructions (Signed)
[facility] Office Phone Number [phone]  BREAST BIOPSY/ PARTIAL MASTECTOMY: POST OP INSTRUCTIONS  Always review your discharge instruction sheet given to you by the facility where your surgery was performed.  IF YOU HAVE DISABILITY OR FAMILY LEAVE FORMS, YOU MUST BRING THEM TO THE OFFICE FOR PROCESSING.  DO NOT GIVE THEM TO YOUR DOCTOR.  1. A prescription for pain medication may be given to you upon discharge.  Take your pain medication as prescribed, if needed.  If narcotic pain medicine is not needed, then you may take acetaminophen (Tylenol), naprosyn (Alleve) or ibuprofen (Advil) as needed. 2. Take your usually prescribed medications unless otherwise directed 3. If you need a refill on your pain medication, please contact your pharmacy.  They will contact our office to request authorization.  Prescriptions will not be filled after 5pm or on week-ends. 4. You should eat very light the first 24 hours after surgery, such as soup, crackers, pudding, etc.  Resume your normal diet the day after surgery. 5. Most patients will experience some swelling and bruising in the breast.  Ice packs  will help.  Wear the breast binder provided for 72 hours day and night.  After that wear a sports bra during the day until you return to the office. Swelling and bruising can take several days to resolve.  6. Leave the clear dressings on the incisions in place.  I will remove these when we change your catheter.  The dressing on the catheter should be changed once daily or as needed if soiled.  Apply triple antibiotic ointment to the site when changing. 7. It is common to experience some constipation if taking pain medication after surgery.  Increasing fluid intake and taking a stool softener will usually help or prevent this problem from occurring.  A mild laxative (Milk of Magnesia or Miralax) should be taken according to package directions if there are no bowel movements after 48  hours. 8. ACTIVITIES:  You may resume regular daily activities (gradually increasing) beginning the next day.  Wearing a good support bra or sports bra minimizes pain and swelling.  You may have sexual intercourse when it is comfortable. a. You may drive when you no longer are taking prescription pain medication, you can comfortably wear a seatbelt, and you can safely maneuver your car and apply brakes. b. RETURN TO WORK:  ______________________________________________________________________________________ 9. We will call with pathology results and setup appointment for change.  Call me if you have any questions about catheter. 10. OTHER INSTRUCTIONS: _______________________________________________________________________________________________ _____________________________________________________________________________________________________________________________________ _____________________________________________________________________________________________________________________________________ _____________________________________________________________________________________________________________________________________  WHEN TO CALL DR Leimomi Zervas: 1. Fever over 101.0 2. Nausea and/or vomiting. 3. Extreme swelling or bruising. 4. Continued bleeding from incision. 5. Increased pain, redness, or drainage from the incision.  The clinic staff is available to answer your questions during regular business hours.  Please don't hesitate to call and ask to speak to one of the nurses for clinical concerns.  If you have a medical emergency, go to the nearest emergency room or call 911.  A surgeon from [facility] is always on call at the hospital.  For further questions, please visit [email or web address]

## 2012-01-02 NOTE — Op Note (Addendum)
Preoperative diagnosis: Clinical stage I left breast cancer Postoperative diagnosis: Same as above Procedure: #1 left breast wire-guided lumpectomy #2 left axillary sentinel lymph node biopsy #3 cavity and evaluation device for future MammoSite use placement #4 injection blue dye for sentinel node identification  Surgeon: Dr. Harden Mo Specimens: #1 left breast tissue marked with paint kit #2 left axillary sentinel node x2 with counts of 40 and 20 Complications: None Drains: None estimated blood loss: Minimal Sponge and needle count correct at end of operation Disposition to recovery room in stable condition  Indications: This is a 66 year old female who presents after undergoing a routine screening mammogram with the finding of a left breast mass. This was confirmed on ultrasound and MRI has been about a 1 cm mass. There were no other abnormalities that were noted. She had discussed all of her options for treatment of this breast cancer. She was also seen by radiation oncology preoperatively. We decided to proceed with a wire-guided lumpectomy, axillary sentinel lymph node biopsy, and hopefully MammoSite device placement and usage. We discussed the risks and benefits of all of these procedures.  Procedure: After informed consent was obtained the patient Firstar wire placed at the breast center. I had these mammograms available for my review in the operating room. She then had technetium administered in the standard periareolar fashion. She was administered 2 g of intravenous cefazolin. Sequential compression devices were placed on her legs prior to induction with anesthesia. She was then placed under general anesthesia with an LMA without complication. Surgical timeout was then performed.  I then cleansed the skin around her nipple areolar complex. I injected a cc of a saline methylene blue dye mixture in all 4 portal positions around the areola. This was massaged for 2 minutes. She was  then prepped and draped in the standard sterile surgical fashion.  I first approached the lesion the breast. I ultrasounded this to identify the lesion and the wire first. I made an elliptical incision and excised the portion of the skin along with this. I then used cautery to remove the wire and the surrounding tissue. This was then marked with the paint kit. Faxitron mammogram in the radiologist both confirmed removal of the lesion, clipped, and wire. It looked like my margins were okay both clinically and on the mammogram. I then packed this with a sponge. I then went to her axilla. There was not a lot of radioactivity present in her axilla. I made an incision below her axillary hairline and carried it through the axillary fascia. I identified a blue lymphatic going into an area that had some radioactivity associated with it. I excised this as well as an additional area with some radioactivity. The numbers were small but these clearly with the only areas containing radioactivity zeros no background radioactivity upon completion. I then observed hemostasis. I closed the axillary fashion with a 2-0 Vicryl. I then closed with 3-0 Vicryl and 4 Monocryl.  I then returned to the breast where I obtained hemostasis. I placed 2 clips deep and one clip in each cornual position around the cavity. I then closed down the cavity a little bit with 2-0 Vicryl's. I then made a nick in the skin near the inframammary crease. I inserted a tonsil and then placed the trocar through this to make a tract. I then placed the cavity evaluation device in the cavity. It looked to be in good position with just over 30 cc present. I then desufflated this. I then closed  the incision in numerous layers of 2-0 Vicryl, 3-0 Vicryl, 4 Monocryl. I then placed 35 cc of the saline and contrast mixture. This appeared to fill the cavity well. On ultrasound from the skin there was about a 1.3 cm offset. This appears to be in good position will be  checked tomorrow with a CT scan to ensure that my deep margin is also okay. Sterile dressings and Steri-Strips were then placed. She had a binder placed. She tolerated this well as extubated and transferred to recovery in stable condition.

## 2012-01-03 ENCOUNTER — Ambulatory Visit
Admission: RE | Admit: 2012-01-03 | Discharge: 2012-01-03 | Disposition: A | Discharge status: Home / Self Care | Payer: BC Managed Care – PPO | Source: Ambulatory Visit | Attending: Radiation Oncology | Admitting: Radiation Oncology

## 2012-01-03 ENCOUNTER — Telehealth (INDEPENDENT_AMBULATORY_CARE_PROVIDER_SITE_OTHER): Payer: Self-pay

## 2012-01-03 DIAGNOSIS — C50419 Malignant neoplasm of upper-outer quadrant of unspecified female breast: Secondary | ICD-10-CM

## 2012-01-03 NOTE — Progress Notes (Signed)
MammoSite treatment planning note:  The patient was taken to the CT simulator and placed supine. Her left breast was then scanned. The CED was well visualized, and surrounded by 3 adjacent clips. The CED was measured to be approximately 4 mm to the chest wall. She was then placed prone, hoping to pull the tumor bed/CED away from the chest wall. There was really no improvement in the CED to chest wall distance. We simulated a treatment with inverse planning with a multi-limit MammoSite catheter/balloon. With differential loading of the channels we were not able to bring the chest wall dose below 130% of the prescribed dose with our target goal being below 125% of the prescribed dose when prescribing the dose to 1.0 cm away from the MammoSite balloon surface away from the chest wall. Therefore, she is not felt to be a candidate for MammoSite HDR brachytherapy. I  will schedule the patient for a followup visit in one week and discuss external beam radiation therapy with her. We should have her pathology report back by no later than tomorrow. I shared my thoughts with Dr. Dwain Sarna.

## 2012-01-03 NOTE — Telephone Encounter (Signed)
error 

## 2012-01-03 NOTE — Anesthesia Postprocedure Evaluation (Signed)
  Anesthesia Post-op Note  Patient: Krystal Payne  Procedure(s) Performed: Procedure(s) (LRB): BREAST LUMPECTOMY WITH NEEDLE LOCALIZATION AND AXILLARY SENTINEL LYMPH NODE BX (Left)  Patient Location: PACU  Anesthesia Type: General  Level of Consciousness: awake, alert  and oriented  Airway and Oxygen Therapy: Patient Spontanous Breathing  Post-op Pain: mild  Post-op Assessment: Post-op Vital signs reviewed and Patient's Cardiovascular Status Stable  Post-op Vital Signs: stable  Complications: No apparent anesthesia complications

## 2012-01-03 NOTE — Anesthesia Postprocedure Evaluation (Signed)
  Anesthesia Post-op Note  Patient: Krystal Payne  Procedure(s) Performed: Procedure(s) (LRB): BREAST LUMPECTOMY WITH NEEDLE LOCALIZATION AND AXILLARY SENTINEL LYMPH NODE BX (Left)  Patient Location: PACU  Anesthesia Type: General  Level of Consciousness: awake, alert  and oriented  Airway and Oxygen Therapy: Patient Spontanous Breathing and Patient connected to nasal cannula oxygen  Post-op Pain: Mild  Post-op Assessment: Post-op Vital signs reviewed and Patient's Cardiovascular Status Stable  Post-op Vital Signs: stable  Complications: No apparent anesthesia complications

## 2012-01-04 NOTE — Progress Notes (Signed)
Encounter addended by: Ardell Isaacs, RN on: 01/04/2012  3:55 PM<BR>     Documentation filed: Notes Section

## 2012-01-04 NOTE — Progress Notes (Signed)
01/03/2012 1551 Received patient in the clinic today following simulation for mammosite dressing change. Patient is alert and oriented to person, place, and time. No distress noted. Steady gait noted. Pleasant affect noted. Patient denies pain at this time. Removed old left breast mammosite dressing. Cleanse area around catheter with normal saline and applied neosporin. Applied drain dressing, 4x4 gauze, reinforced with ABD pad, and secured with paper tape. Assisted patient from gown into street clothes. Provided patient with this writers business card and encouraged to call with needs. Provided patient with supplies to reinforce mammosite dressing if need be.

## 2012-01-05 ENCOUNTER — Ambulatory Visit (INDEPENDENT_AMBULATORY_CARE_PROVIDER_SITE_OTHER): Payer: BC Managed Care – PPO | Admitting: General Surgery

## 2012-01-05 ENCOUNTER — Ambulatory Visit: Payer: BC Managed Care – PPO

## 2012-01-05 ENCOUNTER — Encounter (INDEPENDENT_AMBULATORY_CARE_PROVIDER_SITE_OTHER): Payer: Self-pay | Admitting: General Surgery

## 2012-01-05 ENCOUNTER — Ambulatory Visit: Payer: BC Managed Care – PPO | Admitting: Radiation Oncology

## 2012-01-05 VITALS — BP 118/82 | HR 68 | Temp 98.2°F | Resp 16 | Ht 66.0 in | Wt 174.2 lb

## 2012-01-05 DIAGNOSIS — Z09 Encounter for follow-up examination after completed treatment for conditions other than malignant neoplasm: Secondary | ICD-10-CM

## 2012-01-05 NOTE — Progress Notes (Signed)
Subjective:     Patient ID: Krystal Payne, female   DOB: [birth year], 66 y.o.   MRN: [id number]  HPI 73 yof who underwent left breast lumpectomy with ax snbx earlier this week.  She is doing well postoperatively.  I placed a ced for possible mammosite therapy but the posterior margin is not enough so she comes back in today to discuss pathology as well as have ced removed.  She reports no complaints.  Her pathology is a stage I left breast cancer with negative nodes.  Review of Systems     Objective:   Physical Exam    some bruising around her incisions, ced in place, no infection Assessment:     Stage I left breast cancer    Plan:     I removed ced today and we discussed the hole should seal in next several days. I gave her sheet of exercises to begin with also. She is due to see Dr. Dayton Scrape next week to begin radiation therapy I will see her back one month after xrt done Refer to med onc also.

## 2012-01-05 NOTE — Progress Notes (Signed)
Addended byEmelia Loron on: 01/05/2012 06:30 PM   Modules accepted: Orders

## 2012-01-09 ENCOUNTER — Encounter: Payer: Self-pay | Admitting: Radiation Oncology

## 2012-01-09 ENCOUNTER — Telehealth (INDEPENDENT_AMBULATORY_CARE_PROVIDER_SITE_OTHER): Payer: Self-pay

## 2012-01-09 DIAGNOSIS — C50919 Malignant neoplasm of unspecified site of unspecified female breast: Secondary | ICD-10-CM | POA: Insufficient documentation

## 2012-01-09 NOTE — Progress Notes (Signed)
Unfortunately, patient was felt not to be a candidate for MammoSite HDR brachytherapy. CED removed 01/05/2012 by Dr. Dwain Sarna. Patient returns 01/10/2012 for a one week follow up with Dr. Dayton Scrape to discuss external beam radiation therapy. Invasive ductal carcinoma, 1.1 cm in dimension with clear margins seen on 01/02/2012 pathology.

## 2012-01-10 ENCOUNTER — Encounter: Payer: Self-pay | Admitting: *Deleted

## 2012-01-10 ENCOUNTER — Ambulatory Visit
Admission: RE | Admit: 2012-01-10 | Discharge: 2012-01-10 | Disposition: A | Discharge status: Home / Self Care | Payer: BC Managed Care – PPO | Source: Ambulatory Visit | Attending: Radiation Oncology | Admitting: Radiation Oncology

## 2012-01-10 ENCOUNTER — Encounter: Payer: Self-pay | Admitting: Radiation Oncology

## 2012-01-10 VITALS — BP 104/70 | HR 74 | Temp 97.5°F | Resp 20 | Wt 176.7 lb

## 2012-01-10 DIAGNOSIS — C50919 Malignant neoplasm of unspecified site of unspecified female breast: Secondary | ICD-10-CM

## 2012-01-10 DIAGNOSIS — C50419 Malignant neoplasm of upper-outer quadrant of unspecified female breast: Secondary | ICD-10-CM

## 2012-01-10 NOTE — Progress Notes (Signed)
Please see the Nurse Progress Note in the MD Initial Consult Encounter for this patient. 

## 2012-01-10 NOTE — Progress Notes (Signed)
Pt ambulatory  Steady gait, alert and oriented x 3, left breast incision healing, steri strips intact, a couple areas where skin had blistered, using neosporin prn, soreness still, no c/o pain  8:42 AM

## 2012-01-10 NOTE — Telephone Encounter (Signed)
error 

## 2012-01-10 NOTE — Progress Notes (Signed)
A followup note:  Diagnosis: Pathologic stage I (T1, N0, M0) invasive ductal carcinoma of the left breast  History: Ms. Broadfoot returns today for review and scheduling of her external beam radiation therapy in the management of her pathologic stage I (T1, N0, M0) invasive ductal carcinoma of the left breast. If she is not a candidate for MammoSite brachytherapy because of the proximity of the chest wall to her CED. Her CED was removed last Thursday. She is doing well is not having any drainage from her wounds. On review of her pathology she was found to have a 1.1 cm invasive ductal carcinoma with negative margins. The closest margin was 0.2 cm, posteriorly. A single lymph node was free of metastatic disease. Her tumor remains strongly ER/PR positive at 100% each with a low proliferation index of 6%. HER-2/neu was negative. Dr. Dwain Sarna is scheduling her for medical oncology consultation.  Physical examination: On examination the left breast there are Steri-Strips along her left partial mastectomy wound at 3:00. There is no drainage. Her drain site and sent lymph node biopsy wounds are healing well. Extremities without lymphedema.  Impression: Pathologic stage I (T1, N0, M0) invasive ductal carcinoma of the left breast. We discussed whole breast irradiation great detail. She may be a candidate for hypo-fractionated radiation therapy provided that her treatment "tangent separation" is not too large. She will also be considered for deep respirations/breath-hold tangential fields to avoid cardiac irradiation if necessary. We discussed the potential acute and late toxicities of whole breast radiation and she wishes to proceed as outlined. I will get her scheduled for simulation/treatment planning early next week and begin her radiation therapy the following week. Consent was signed today. I will check with our medical oncology colleagues just to make sure that they do not want to run in Oncotype DX of her tumor  was would probably be quite low considering her stage, hormone receptor status and proliferation index.  30 minutes was spent face-to-face with the patient, primarily counseling the patient.

## 2012-01-11 ENCOUNTER — Telehealth: Payer: Self-pay | Admitting: *Deleted

## 2012-01-11 NOTE — Telephone Encounter (Signed)
Confirmed 01/12/12 appt w/ pt.  Unable to mail before appt letter to pt - gave verbal.  Took paperwork to med rec for chart.

## 2012-01-11 NOTE — Progress Notes (Signed)
Encounter addended by: Agnes Lawrence, RN on: 01/11/2012  4:51 PM<BR>     Documentation filed: Charges VN, Inpatient Patient Education

## 2012-01-12 ENCOUNTER — Other Ambulatory Visit: Payer: Self-pay | Admitting: *Deleted

## 2012-01-12 ENCOUNTER — Ambulatory Visit (HOSPITAL_BASED_OUTPATIENT_CLINIC_OR_DEPARTMENT_OTHER): Payer: BC Managed Care – PPO | Admitting: Oncology

## 2012-01-12 ENCOUNTER — Other Ambulatory Visit (HOSPITAL_BASED_OUTPATIENT_CLINIC_OR_DEPARTMENT_OTHER): Payer: BC Managed Care – PPO | Admitting: Lab

## 2012-01-12 ENCOUNTER — Ambulatory Visit: Payer: BC Managed Care – PPO

## 2012-01-12 VITALS — BP 124/80 | HR 66 | Temp 98.6°F | Ht 65.5 in | Wt 175.4 lb

## 2012-01-12 DIAGNOSIS — C50419 Malignant neoplasm of upper-outer quadrant of unspecified female breast: Secondary | ICD-10-CM

## 2012-01-12 LAB — CBC WITH DIFFERENTIAL/PLATELET
EOS%: 5.7 % (ref 0.0–7.0)
Eosinophils Absolute: 0.4 10*3/uL (ref 0.0–0.5)
LYMPH%: 29.3 % (ref 14.0–49.7)
MCH: 31.1 pg (ref 25.1–34.0)
MCHC: 33.6 g/dL (ref 31.5–36.0)
MCV: 92.7 fL (ref 79.5–101.0)
MONO%: 6.1 % (ref 0.0–14.0)
Platelets: 283 10*3/uL (ref 145–400)
RBC: 4.32 10*6/uL (ref 3.70–5.45)
RDW: 12.6 % (ref 11.2–14.5)

## 2012-01-12 LAB — COMPREHENSIVE METABOLIC PANEL
AST: 13 U/L (ref 0–37)
Albumin: 3.8 g/dL (ref 3.5–5.2)
Alkaline Phosphatase: 74 U/L (ref 39–117)
Glucose, Bld: 87 mg/dL (ref 70–99)
Potassium: 4 mEq/L (ref 3.5–5.3)
Sodium: 144 mEq/L (ref 135–145)
Total Bilirubin: 0.3 mg/dL (ref 0.3–1.2)
Total Protein: 7.2 g/dL (ref 6.0–8.3)

## 2012-01-12 NOTE — Telephone Encounter (Signed)
gave patient appointment for two month follow up printed out calendar and gave to the patient

## 2012-01-12 NOTE — Progress Notes (Signed)
Referral MD Dr. Jamie Kato   Reason for Referral: Breast cancer    Chief Complaint  Patient presents with     : Delightful 66 year old woman in excellent health who presents with an abnormal mammogram. Screening mammogram 12/01/2011 showed a possible mass in the left breast. Diagnostic mammogram and ultrasound performed on 12/15/2011 showed a subtle the palpable mass at 3:00 position. On ultrasound this measured 1.1 x 0.8 x 0.7 cm. A biopsy was performed on 12/15/2011 which showed invasive mammary carcinoma grade 1/2 ER +100% PR +100%, proliferative index 6% HER-2 was amplified with a ratio of 1.36 bilateral MRI exam on 12/21/2011 showed a solitary 11 x 9 x 9 mm mass is doing third of her left breast. No other abnormalities were seen in a suspicious lesion in the liver was likely a felt to be a cyst. The patient underwent a lumpectomy and sentinel lymph node evaluation on 01/02/2012. Final pathology showed a 1.1 cm grade 1/2 cancer with 1 and negative sentinel lymph node and clear surgical margins. She does have an unremarkable postoperative course. A. intraoperatively in the MammoSite catheter was placed subsequently this was removed and was realized that the distance to the skin was too close. She has been referred for external beam irradiation and it seems as for a medical oncology consultation.   HPI:  Past Medical History  Diagnosis Date  . Hyperlipidemia   . Breast cancer     left breast  :  Past Surgical History  Procedure Date  . Breast biopsy     left  . Breast lumpectomy     left lumpectomy, snbx  . Vaginal hysterectomy 1980  :  Current outpatient prescriptions:oxybutynin (DITROPAN) 5 MG tablet, Take 5 mg by mouth daily. , Disp: , Rfl: ;  simvastatin (ZOCOR) 40 MG tablet, Take 20 mg by mouth daily. , Disp: , Rfl: :    :  No Known Allergies:  Family History  Problem Relation Age of Onset  . Heart disease Mother   . Heart disease Sister   . Cancer Brother    colon//under treatment  . Heart disease Brother   . Cancer Maternal Aunt     breast/dx 16 years ago/currently 43  :  History   Social History  . Marital Status: Married x 17 y ; 2 children; 2 grandchildren    Spouse Name: N/A    Number of Children: 2  . Years of Education: N/A     . 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 She is one of 12 children in her family one sibling has died of myocardial infarction. She has another brother was recently diagnosed with stage IIIc colon cancer. She is a strong family history of colonic polyps and her family including herself.    Social History Main Topics  . Smoking status: Former Smoker -- 0.2 packs/day for 2 years    Types: Cigarettes    Quit date: 10/31/1968  . Smokeless tobacco: Not on file  . Alcohol Use: Yes     occasional beer or wine  . Drug Use: No  . Sexually Active:    Other Topics Concern  . Not on file   Social History Narrative  . No narrative on file  :  @Reproductive  History@  G2 P2, menarche 18 menopause at time of hysterectomy 1985 ovaries were left in  A comprehensive review of systems was negative.  Exam:  Pleasant alert woman in no acute distress  General appearance: alert, cooperative and appears stated age Eyes: conjunctivae/corneas clear. PERRL, EOM's intact. Fundi benign. Throat: lips, mucosa, and tongue normal; teeth and gums normal Resp: clear to auscultation bilaterally and normal percussion bilaterally Breasts: normal appearance, no masses or tenderness Cardio: regular rate and rhythm, S1, S2 normal, no murmur, click, rub or gallop and normal apical impulse GI: normal findings: bowel sounds normal Extremities: extremities normal, atraumatic, no cyanosis or edema Pulses: 2+ and symmetric Lymph nodes: Cervical, supraclavicular, and axillary nodes normal. Neurologic: Grossly normal   Basename 01/12/12 1607  WBC 6.3  HGB 13.5  HCT 40.0  PLT 283     Basename 01/12/12 1607  NA 144  K 4.0  CL 109  CO2 29  GLUCOSE 87  BUN 22  CREATININE 0.82  CALCIUM 9.7    Blood smear review: n/a  Pathology:as above  Chest 2 View  01/02/2012  *RADIOLOGY REPORT*  Clinical Data: Preoperative respiratory exam for lumpectomy  CHEST - 2 VIEW  Comparison: None.  Findings: Heart size is normal.  Mediastinal shadows are normal. Localization wire evident in the left breast.  The lungs are clear. No effusions.  No significant bony finding.  IMPRESSION: No active cardiopulmonary disease.  Localization wire left breast.  Original Report Authenticated By: Thomasenia Sales, M.D.   US Breast Left  12/15/2011  *RADIOLOGY REPORT*  Clinical Data:  Screening callback for questioned left breast mass  DIGITAL DIAGNOSTIC LEFT MAMMOGRAM WITH CAD AND LEFT BREAST ULTRASOUND:  Comparison:  Prior exams  Findings:  Additional views confirm the presence of a circumscribed irregular mass measuring 1 cm in the left breast three o'clock location. Mammographic images were processed with CAD.  On physical exam, I palpate a subtle firm mass in the left breast three o'clock location 7 cm from the nipple.  Ultrasound is performed, showing an irregular hypoechoic shadowing mass in the left breast three o'clock location 7 cm from the nipple measuring 1.1 x 0.8 x 0.7 cm.  This corresponds to the mammographic finding.  IMPRESSION: Suspicious left breast mass 3 o'clock location.  Ultrasound-guided core biopsy will be performed and dictated separately today. Findings and recommendations discussed with the patient and provided in written form at the time of the exam.  I also called these findings to the referring physician's office at 9:45 a.m. on 12/15/2011.  BI-RADS CATEGORY 4:  Suspicious abnormality - biopsy should be considered.  Recommendation:  Left ultrasound guided core biopsy  Original Report Authenticated By: Harrel Lemon, M.D.   Mr Breast Bilateral W Wo Contrast  12/22/2011   *RADIOLOGY REPORT*  Clinical Data: Recently diagnosed invasive mammary carcinoma following ultrasound-guided biopsy of a 1.1 cm mass 3 o'clock position 7 cm from the nipple.  BUN and creatinine were obtained on site at [facility] at [address] Results:  BUN 16 mg/dL,  Creatinine 0.9 mg/dL.  BILATERAL BREAST MRI WITH AND WITHOUT CONTRAST  Technique: Multiplanar, multisequence MR images of both breasts were obtained prior to and following the intravenous administration of 16ml of Multihance.  Three dimensional images were evaluated at the independent DynaCad workstation.  Comparison:  Bilateral mammogram 12/01/2011, left diagnostic mammogram, left breast ultrasound and, and post clip mammogram 12/15/2011.  Findings: There is a minimal background enhancement pattern.  In the posterior third of the outer left breast, at the level of the nipple, is a solitary 11 x 9 x 9 mm oval mass with indistinct margins and washout kinetics.  There is some surrounding biopsy change.  Biopsy clip artifact is seen centrally within the mass.  No additional areas of suspicious enhancement are identified in the left breast.  No mass or suspicious enhancement is identified in the right breast to suggest malignancy.  Negative for axillary or internal mammary chain lymphadenopathy.  In the left lobe of the liver is a circumscribed oval 9 mm T2 bright lesion that does not enhance.  No prior abdominal imaging is available for comparison.  IMPRESSION:  1.  Solitary 11 mm biopsy-proven invasive mammary carcinoma in the 3 o'clock position of the left breast, posterior third. 2.  No MRI evidence of malignancy in the right breast. 3.  9 mm T2 bright non-enhancing circumscribed left hepatic lobe lesion.   This is most likely a benign hepatic cyst.  No prior abdominal imaging available for comparison.  THREE-DIMENSIONAL MR IMAGE RENDERING ON INDEPENDENT WORKSTATION:  Three-dimensional MR images were rendered by post-processing of the  original MR data on an independent workstation.  The three- dimensional MR images were interpreted, and findings were reported in the accompanying complete MRI report for this study.  BI-RADS CATEGORY 6:  Known biopsy-proven malignancy - appropriate action should be taken.  Original Report Authenticated By: Britta Mccreedy, M.D.   Nm Sentinel Node Inj-no Rpt (breast)  01/02/2012  CLINICAL DATA: left axillary sentinel node for breast cancer   Sulfur colloid was injected intradermally by the nuclear medicine  technologist for breast cancer sentinel node localization.     [place] Core Biopsy  12/16/2011  **ADDENDUM** CREATED: 12/16/2011 12:51:08  Pathologic results indicate invasive mammary carcinoma, likely grade 1 -2, likely of ductal origin.  The patient has appointments for MRI (2/20) and surgical consultation with Dr. Dwain Sarna (2/25). I gave the patient these results and appointment time by telephone today at 1300, and she indicated that she was not having any complication or discomfort related to the biopsy.  Addended by:  Esperanza Heir, M.D. on 12/16/2011 12:51:08.  **END ADDENDUM** SIGNED BY: Esperanza Heir, M.D.    12/15/2011  *RADIOLOGY REPORT*  Clinical Data:  Suspicious left breast mass 3 o'clock location  ULTRASOUND GUIDED VACUUM ASSISTED CORE BIOPSY OF THE LEFT BREAST  Comparison: Prior exams  I met with the patient and we discussed the procedure of ultrasound- guided biopsy, including benefits and alternatives.  We discussed the high likelihood of a successful procedure. We discussed the risks of the procedure, including infection, bleeding, tissue injury, clip migration, and inadequate sampling.  Informed, written consent was given.  Using sterile technique, 2% lidocaine, ultrasound guidance, and a 12 gauge vacuum assisted needle, biopsy was performed of left breast mass 3 o'clock location using a lateral to medial approach. At the conclusion of the procedure, a tissue marker clip was deployed into the  biopsy cavity.  Follow-up 2-view mammogram was performed and dictated separately.  IMPRESSION: Ultrasound-guided biopsy of left breast mass 3 o'clock location, with clip placement.  Pathology is pending.  No apparent complications.  Original Report Authenticated By: [email or web address]   [place] Wire Localization Left  01/02/2012  *RADIOLOGY REPORT*  Clinical Data:  Recent diagnosis of breast cancer at to 3 o'clock in the left breast.  LEFT BREAST NEEDLE LOCALIZATION USING ULTRASOUND GUIDANCE AND SPECIMEN RADIOGRAPH  Patient presents for needle localization prior to surgical excision. The patient and I discussed the procedure of needle localization including benefits and alternatives. We discussed the high likelihood of a successful procedure. We discussed the risks of the procedure, including infection, bleeding, tissue injury and further surgery. Informed  written consent was given.  Using ultrasound guidance, sterile technique, 2% lidocaine and a 7 cm InRad Ultrawire, the mass at 3 o'clock in the left breast was localized using a lateromedial approach. Films were labeled and sent with the patient to surgery.  She tolerated the procedure well.  Specimen radiograph is performed at [facility] operating room and confirms the clip, mass and wire to be present in the tissue sample.  The specimen is marked for pathology.  IMPRESSION: Needle localization left breast.  No apparent complications.  Original Report Authenticated By: Daryl Eastern, M.D.   Mm Breast Surgical Specimen  01/02/2012  *RADIOLOGY REPORT*  Clinical Data:  Recent diagnosis of breast cancer at to 3 o'clock in the left breast.  LEFT BREAST NEEDLE LOCALIZATION USING ULTRASOUND GUIDANCE AND SPECIMEN RADIOGRAPH  Patient presents for needle localization prior to surgical excision. The patient and I discussed the procedure of needle localization including benefits and alternatives. We discussed the high likelihood of a successful procedure. We discussed the  risks of the procedure, including infection, bleeding, tissue injury and further surgery. Informed written consent was given.  Using ultrasound guidance, sterile technique, 2% lidocaine and a 7 cm InRad Ultrawire, the mass at 3 o'clock in the left breast was localized using a lateromedial approach. Films were labeled and sent with the patient to surgery.  She tolerated the procedure well.  Specimen radiograph is performed at [facility] operating room and confirms the clip, mass and wire to be present in the tissue sample.  The specimen is marked for pathology.  IMPRESSION: Needle localization left breast.  No apparent complications.  Original Report Authenticated By: Daryl Eastern, M.D.   Mm Digital Diag Ltd L  12/15/2011  *RADIOLOGY REPORT*  Clinical Data:  Screening callback for questioned left breast mass  DIGITAL DIAGNOSTIC LEFT MAMMOGRAM WITH CAD AND LEFT BREAST ULTRASOUND:  Comparison:  Prior exams  Findings:  Additional views confirm the presence of a circumscribed irregular mass measuring 1 cm in the left breast three o'clock location. Mammographic images were processed with CAD.  On physical exam, I palpate a subtle firm mass in the left breast three o'clock location 7 cm from the nipple.  Ultrasound is performed, showing an irregular hypoechoic shadowing mass in the left breast three o'clock location 7 cm from the nipple measuring 1.1 x 0.8 x 0.7 cm.  This corresponds to the mammographic finding.  IMPRESSION: Suspicious left breast mass 3 o'clock location.  Ultrasound-guided core biopsy will be performed and dictated separately today. Findings and recommendations discussed with the patient and provided in written form at the time of the exam.  I also called these findings to the referring physician's office at 9:45 a.m. on 12/15/2011.  BI-RADS CATEGORY 4:  Suspicious abnormality - biopsy should be considered.  Recommendation:  Left ultrasound guided core biopsy  Original Report  Authenticated By: Harrel Lemon, M.D.   Mm Digital Diagnostic Unilat L  12/15/2011  *RADIOLOGY REPORT*  Clinical Data:  Clip placement after left ultrasound-guided breast biopsy  DIGITAL DIAGNOSTIC LEFT MAMMOGRAM  Comparison:  Prior exams  Findings:  Films are performed following ultrasound guided biopsy of left breast mass 3 o'clock location.  The wing shaped clip is appropriately located at the mammographically evident left breast mass.  IMPRESSION: Appropriate wing shaped clip location left breast three o'clock location at the biopsy.  Original Report Authenticated By: Harrel Lemon, M.D.    Assessment and Plan : Pleasant 66 year old woman with T1  C. N0 ER/PR positive breast cancer. We had a 30 minute visit discussion today regarding the role of adjuvant chemotherapy in her setting. She is not particularly excited about receiving chemotherapy. I have given her my own assessment of the absolute benefit of chemotherapy given her type of cancer. I also described Oncotype testing. She is disinclined to pursue this and is satisfied with her  receiving radiation followed by adjuvant hormonal therapy. I feel that   this would give her ultimate protection against recurrence of breast cancer. I will see her again in followup after she currently has completed radiation. We will try to get a more recent bone density test.   Pierce Crane M.D. [facility]   60 minutes was spent with the patient half the time and patient-related counseling

## 2012-01-17 ENCOUNTER — Ambulatory Visit
Admission: RE | Admit: 2012-01-17 | Discharge: 2012-01-17 | Disposition: A | Discharge status: Home / Self Care | Payer: BC Managed Care – PPO | Source: Ambulatory Visit | Attending: Radiation Oncology | Admitting: Radiation Oncology

## 2012-01-17 DIAGNOSIS — C50919 Malignant neoplasm of unspecified site of unspecified female breast: Secondary | ICD-10-CM

## 2012-01-17 NOTE — Progress Notes (Signed)
Met with patient to discuss RO billing.  Patient had no concerns today. 

## 2012-01-17 NOTE — Progress Notes (Signed)
Simulation/treatment planning note:  The patient underwent simulation/treatment planning today in preparation for external beam treatment to her left breast. A custom neck mold was constructed on a breast board for immobilization. Her left partial mastectomy wound was marked with a radiopaque wire. Her field borders were also marked. She was then scanned. Proposed tangential fields would encompass her cardiac silhouette, therefore she was scanned with deep inspiration/breath-hold technique. There was movement of the cardiac silhouette with deep inspiration. 2 separate multileaf, this were designed to conform the field. I prescribing 4680 cGy in 26 sessions utilizing 6 MV photons. This was followed by a three-field boost for a further 1400 cGy in 7 sessions. Isodose plans and dosimetry are requested.

## 2012-01-23 ENCOUNTER — Encounter: Payer: Self-pay | Admitting: *Deleted

## 2012-01-23 NOTE — Progress Notes (Signed)
Mailed after appt letter to pt. 

## 2012-01-24 ENCOUNTER — Encounter: Payer: Self-pay | Admitting: Radiation Oncology

## 2012-01-24 ENCOUNTER — Ambulatory Visit
Admission: RE | Admit: 2012-01-24 | Discharge: 2012-01-24 | Disposition: A | Discharge status: Home / Self Care | Payer: BC Managed Care – PPO | Source: Ambulatory Visit | Attending: Radiation Oncology | Admitting: Radiation Oncology

## 2012-01-24 NOTE — Progress Notes (Signed)
Simulation verification note:  The patient underwent similar to verification for treatment to her left breast. Her isocenter is in good position and the multileaf collimators contoured the target volume appropriately.

## 2012-01-25 ENCOUNTER — Ambulatory Visit
Admission: RE | Admit: 2012-01-25 | Discharge: 2012-01-25 | Disposition: A | Discharge status: Home / Self Care | Payer: BC Managed Care – PPO | Source: Ambulatory Visit | Attending: Radiation Oncology | Admitting: Radiation Oncology

## 2012-01-25 DIAGNOSIS — C50419 Malignant neoplasm of upper-outer quadrant of unspecified female breast: Secondary | ICD-10-CM

## 2012-01-25 MED ORDER — RADIAPLEXRX EX GEL
Freq: Once | CUTANEOUS | Status: AC
  Administered 2012-01-25: 12:00:00 via TOPICAL

## 2012-01-25 MED ORDER — ALRA NON-METALLIC DEODORANT (RAD-ONC)
1.0000 "application " | Freq: Once | TOPICAL | Status: AC
  Administered 2012-01-25: 1 via TOPICAL

## 2012-01-25 NOTE — Progress Notes (Signed)
Post sim teaching with patient, gave book radiation therapy and you, flyer on skin products  And use of products,  Gave patient alra deodorant, radiaplex gel, pt to see MD weekly/prn, can call for any concerns, pt gave verbal understanding,

## 2012-01-26 ENCOUNTER — Ambulatory Visit
Admission: RE | Admit: 2012-01-26 | Discharge: 2012-01-26 | Disposition: A | Discharge status: Home / Self Care | Payer: BC Managed Care – PPO | Source: Ambulatory Visit | Attending: Radiation Oncology | Admitting: Radiation Oncology

## 2012-01-27 ENCOUNTER — Ambulatory Visit
Admission: RE | Admit: 2012-01-27 | Discharge: 2012-01-27 | Disposition: A | Discharge status: Home / Self Care | Payer: BC Managed Care – PPO | Source: Ambulatory Visit | Attending: Radiation Oncology | Admitting: Radiation Oncology

## 2012-01-30 ENCOUNTER — Ambulatory Visit
Admission: RE | Admit: 2012-01-30 | Discharge: 2012-01-30 | Disposition: A | Discharge status: Home / Self Care | Payer: BC Managed Care – PPO | Source: Ambulatory Visit | Attending: Radiation Oncology | Admitting: Radiation Oncology

## 2012-01-30 ENCOUNTER — Encounter: Payer: Self-pay | Admitting: Radiation Oncology

## 2012-01-30 DIAGNOSIS — C50919 Malignant neoplasm of unspecified site of unspecified female breast: Secondary | ICD-10-CM

## 2012-01-30 DIAGNOSIS — C50419 Malignant neoplasm of upper-outer quadrant of unspecified female breast: Secondary | ICD-10-CM

## 2012-01-30 NOTE — Progress Notes (Signed)
   Weekly Management Note, left breast cancer Current Dose: 720  cGy  Projected Dose:  6280 cGy   Narrative:  The patient presents for routine under treatment assessment.  CBCT/MVCT images/Port film x-rays were reviewed.  The chart was checked. She is doing well. No skin irritation thus far. She has a little bit of post surgical erythema over her left breast it is mild.  Physical Findings: Weight:  . She is in no acute distress sitting in a chair. Her left breast is slightly erythematous.  Impression:  The patient is tolerating radiotherapy.  Plan:  Continue radiotherapy as planned.

## 2012-01-30 NOTE — Progress Notes (Signed)
Routine weekly md visit for left breast cancer. Completed  4 of 35 radiation treatments to left breast. Mild soreness of breast only. Skin pink otherwise no changes.

## 2012-01-31 ENCOUNTER — Ambulatory Visit
Admission: RE | Admit: 2012-01-31 | Discharge: 2012-01-31 | Disposition: A | Discharge status: Home / Self Care | Payer: BC Managed Care – PPO | Source: Ambulatory Visit | Attending: Radiation Oncology | Admitting: Radiation Oncology

## 2012-02-01 ENCOUNTER — Ambulatory Visit
Admission: RE | Admit: 2012-02-01 | Discharge: 2012-02-01 | Disposition: A | Discharge status: Home / Self Care | Payer: BC Managed Care – PPO | Source: Ambulatory Visit | Attending: Radiation Oncology | Admitting: Radiation Oncology

## 2012-02-02 ENCOUNTER — Ambulatory Visit
Admission: RE | Admit: 2012-02-02 | Discharge: 2012-02-02 | Disposition: A | Discharge status: Home / Self Care | Payer: BC Managed Care – PPO | Source: Ambulatory Visit | Attending: Radiation Oncology | Admitting: Radiation Oncology

## 2012-02-03 ENCOUNTER — Ambulatory Visit
Admission: RE | Admit: 2012-02-03 | Discharge: 2012-02-03 | Disposition: A | Discharge status: Home / Self Care | Payer: BC Managed Care – PPO | Source: Ambulatory Visit | Attending: Radiation Oncology | Admitting: Radiation Oncology

## 2012-02-06 ENCOUNTER — Ambulatory Visit
Admission: RE | Admit: 2012-02-06 | Discharge: 2012-02-06 | Disposition: A | Discharge status: Home / Self Care | Payer: BC Managed Care – PPO | Source: Ambulatory Visit | Attending: Radiation Oncology | Admitting: Radiation Oncology

## 2012-02-06 VITALS — Wt 179.3 lb

## 2012-02-06 DIAGNOSIS — C50919 Malignant neoplasm of unspecified site of unspecified female breast: Secondary | ICD-10-CM

## 2012-02-06 NOTE — Progress Notes (Signed)
Weekly Management Note:  Site:L Breast Current Dose:  1620  cGy Projected Dose: 6280  cGy  Narrative: The patient is seen today for routine under treatment assessment. CBCT/MVCT images/port films were reviewed. The chart was reviewed.   No new complaints today. She still has generalized soreness along her central breast. She is using Radioplex gel.  Physical Examination: There were no vitals filed for this visit..  Weight: 179 lb 4.8 oz (81.33 kg). No significant skin changes.  Impression: Tolerating radiation therapy well.  Plan: Continue radiation therapy as planned.

## 2012-02-06 NOTE — Progress Notes (Signed)
Pt states her left breast "is still very sensitive, pink, but was this way prior to beginning treatment". She reported this to Dr Dayton Scrape last week.

## 2012-02-07 ENCOUNTER — Ambulatory Visit
Admission: RE | Admit: 2012-02-07 | Discharge: 2012-02-07 | Disposition: A | Discharge status: Home / Self Care | Payer: BC Managed Care – PPO | Source: Ambulatory Visit | Attending: Radiation Oncology | Admitting: Radiation Oncology

## 2012-02-08 ENCOUNTER — Ambulatory Visit
Admission: RE | Admit: 2012-02-08 | Discharge: 2012-02-08 | Disposition: A | Discharge status: Home / Self Care | Payer: BC Managed Care – PPO | Source: Ambulatory Visit | Attending: Radiation Oncology | Admitting: Radiation Oncology

## 2012-02-09 ENCOUNTER — Ambulatory Visit
Admission: RE | Admit: 2012-02-09 | Discharge: 2012-02-09 | Disposition: A | Discharge status: Home / Self Care | Payer: BC Managed Care – PPO | Source: Ambulatory Visit | Attending: Radiation Oncology | Admitting: Radiation Oncology

## 2012-02-10 ENCOUNTER — Ambulatory Visit
Admission: RE | Admit: 2012-02-10 | Discharge: 2012-02-10 | Disposition: A | Discharge status: Home / Self Care | Payer: BC Managed Care – PPO | Source: Ambulatory Visit | Attending: Radiation Oncology | Admitting: Radiation Oncology

## 2012-02-13 ENCOUNTER — Ambulatory Visit
Admission: RE | Admit: 2012-02-13 | Discharge: 2012-02-13 | Disposition: A | Discharge status: Home / Self Care | Payer: BC Managed Care – PPO | Source: Ambulatory Visit | Attending: Radiation Oncology | Admitting: Radiation Oncology

## 2012-02-14 ENCOUNTER — Ambulatory Visit
Admission: RE | Admit: 2012-02-14 | Discharge: 2012-02-14 | Disposition: A | Discharge status: Home / Self Care | Payer: BC Managed Care – PPO | Source: Ambulatory Visit | Attending: Radiation Oncology | Admitting: Radiation Oncology

## 2012-02-14 DIAGNOSIS — C50919 Malignant neoplasm of unspecified site of unspecified female breast: Secondary | ICD-10-CM

## 2012-02-14 MED ORDER — RADIAPLEXRX EX GEL
Freq: Once | CUTANEOUS | Status: AC
  Administered 2012-02-14: 14:00:00 via TOPICAL

## 2012-02-14 NOTE — Progress Notes (Signed)
Encounter addended by: Amanda Pea, RN on: 02/14/2012  1:53 PM<BR>     Documentation filed: Inpatient MAR, Orders

## 2012-02-14 NOTE — Progress Notes (Signed)
Weekly Management Note:  Site:L Breast Current Dose:  2700  cGy Projected Dose: 6280  cGy  Narrative: The patient is seen today for routine under treatment assessment. CBCT/MVCT images/port films were reviewed. The chart was reviewed.   She continues with her Radioplex gel. She is without complaints today.  Physical Examination: There were no vitals filed for this visit..  Weight:  . There is mild to moderate erythema the skin particularly the inframammary region. No areas of desquamation.  Impression: Tolerating radiation therapy well.  Plan: Continue radiation therapy as planned.

## 2012-02-14 NOTE — Progress Notes (Signed)
HERE TODAY FOR PUT .  ONLY HAS SOME SORENESS AT TREAMENT AREA.  SKIN LOOKS GOOD.  USES RADIAPLEX

## 2012-02-15 ENCOUNTER — Ambulatory Visit
Admission: RE | Admit: 2012-02-15 | Discharge: 2012-02-15 | Disposition: A | Discharge status: Home / Self Care | Payer: BC Managed Care – PPO | Source: Ambulatory Visit | Attending: Radiation Oncology | Admitting: Radiation Oncology

## 2012-02-16 ENCOUNTER — Ambulatory Visit
Admission: RE | Admit: 2012-02-16 | Discharge: 2012-02-16 | Disposition: A | Discharge status: Home / Self Care | Payer: BC Managed Care – PPO | Source: Ambulatory Visit | Attending: Radiation Oncology | Admitting: Radiation Oncology

## 2012-02-17 ENCOUNTER — Ambulatory Visit
Admission: RE | Admit: 2012-02-17 | Discharge: 2012-02-17 | Disposition: A | Discharge status: Home / Self Care | Payer: BC Managed Care – PPO | Source: Ambulatory Visit | Attending: Radiation Oncology | Admitting: Radiation Oncology

## 2012-02-20 ENCOUNTER — Ambulatory Visit
Admission: RE | Admit: 2012-02-20 | Discharge: 2012-02-20 | Disposition: A | Discharge status: Home / Self Care | Payer: BC Managed Care – PPO | Source: Ambulatory Visit | Attending: Radiation Oncology | Admitting: Radiation Oncology

## 2012-02-20 ENCOUNTER — Encounter: Payer: Self-pay | Admitting: Radiation Oncology

## 2012-02-20 VITALS — Wt 182.1 lb

## 2012-02-20 DIAGNOSIS — C50919 Malignant neoplasm of unspecified site of unspecified female breast: Secondary | ICD-10-CM

## 2012-02-20 NOTE — Progress Notes (Signed)
Weekly Management Note:  Site:L Breast Current Dose:  3420  cGy Projected Dose: 6280  cGy  Narrative: The patient is seen today for routine under treatment assessment. CBCT/MVCT images/port films were reviewed. The chart was reviewed.   She does report mild skin irritation and continues with her Radioplex gel.  Physical Examination: There were no vitals filed for this visit..  Weight: 182 lb 1.6 oz (82.6 kg). There is diffuse erythema along the left breast, particular along the lower axilla and inframammary region as expected. No areas of desquamation.  Impression: Tolerating radiation therapy well.  Plan: Continue radiation therapy as planned.

## 2012-02-20 NOTE — Progress Notes (Signed)
Here for weekly routine visit of left breast cancer. Has completed 19 of 35 radiation treatment. Mild skin changes some redness and follicular reaction Denies pain.Mild fatigue.Marland Kitchen

## 2012-02-20 NOTE — Progress Notes (Signed)
Complex simulation note  The patient underwent virtual simulation for her left breast boost. She was set up a 3 field technique. 3 separate multileaf collimators were designed to conform the field. I prescribing 1004 cGy 7 sessions utilizing mixed 18 MV and 10 MV photons. An isodose plan was reviewed and accepted.

## 2012-02-21 ENCOUNTER — Ambulatory Visit
Admission: RE | Admit: 2012-02-21 | Discharge: 2012-02-21 | Disposition: A | Discharge status: Home / Self Care | Payer: BC Managed Care – PPO | Source: Ambulatory Visit | Attending: Radiation Oncology | Admitting: Radiation Oncology

## 2012-02-22 ENCOUNTER — Ambulatory Visit
Admission: RE | Admit: 2012-02-22 | Discharge: 2012-02-22 | Disposition: A | Discharge status: Home / Self Care | Payer: BC Managed Care – PPO | Source: Ambulatory Visit | Attending: Radiation Oncology | Admitting: Radiation Oncology

## 2012-02-23 ENCOUNTER — Ambulatory Visit
Admission: RE | Admit: 2012-02-23 | Discharge: 2012-02-23 | Disposition: A | Discharge status: Home / Self Care | Payer: BC Managed Care – PPO | Source: Ambulatory Visit | Attending: Radiation Oncology | Admitting: Radiation Oncology

## 2012-02-24 ENCOUNTER — Ambulatory Visit
Admission: RE | Admit: 2012-02-24 | Discharge: 2012-02-24 | Disposition: A | Discharge status: Home / Self Care | Payer: BC Managed Care – PPO | Source: Ambulatory Visit | Attending: Radiation Oncology | Admitting: Radiation Oncology

## 2012-02-27 ENCOUNTER — Ambulatory Visit
Admission: RE | Admit: 2012-02-27 | Discharge: 2012-02-27 | Disposition: A | Discharge status: Home / Self Care | Payer: BC Managed Care – PPO | Source: Ambulatory Visit | Attending: Radiation Oncology | Admitting: Radiation Oncology

## 2012-02-27 DIAGNOSIS — C50919 Malignant neoplasm of unspecified site of unspecified female breast: Secondary | ICD-10-CM

## 2012-02-27 NOTE — Progress Notes (Signed)
Weekly Management Note:  Site:L Breast Current Dose:  4320  cGy Projected Dose: 6240  cGy  Narrative: The patient is seen today for routine under treatment assessment. CBCT/MVCT images/port films were reviewed. The chart was reviewed.   No complaints today. She had a busy weekend. She is using Radioplex gel when necessary.  Physical Examination: There were no vitals filed for this visit..  Weight:  . There is mild to moderate erythema along the left breast with no areas of desquamation.  Impression: Tolerating radiation therapy well.  Plan: Continue radiation therapy as planned.

## 2012-02-27 NOTE — Progress Notes (Signed)
HERE TODAY FOR PUT OF LEFT BREAST.  CONTINUES TO HAVE SOME SORENESS IN BREAST BUT NO PAIN.  SKIN LIGHT RED WITH NO DESQUAMATION NOTED.  NO OTHER C/O

## 2012-02-28 ENCOUNTER — Ambulatory Visit
Admission: RE | Admit: 2012-02-28 | Discharge: 2012-02-28 | Disposition: A | Discharge status: Home / Self Care | Payer: BC Managed Care – PPO | Source: Ambulatory Visit | Attending: Radiation Oncology | Admitting: Radiation Oncology

## 2012-02-28 ENCOUNTER — Encounter: Payer: Self-pay | Admitting: Radiation Oncology

## 2012-02-28 NOTE — Progress Notes (Signed)
Simulation verification note: The patient underwent simulation verification for her reduced left breast photon boost. Her isocenter is in good position and the multileaf collimators contoured the treatment volume appropriate.

## 2012-02-29 ENCOUNTER — Ambulatory Visit
Admission: RE | Admit: 2012-02-29 | Discharge: 2012-02-29 | Disposition: A | Discharge status: Home / Self Care | Payer: BC Managed Care – PPO | Source: Ambulatory Visit | Attending: Radiation Oncology | Admitting: Radiation Oncology

## 2012-03-01 ENCOUNTER — Ambulatory Visit
Admission: RE | Admit: 2012-03-01 | Discharge: 2012-03-01 | Disposition: A | Discharge status: Home / Self Care | Payer: BC Managed Care – PPO | Source: Ambulatory Visit | Attending: Radiation Oncology | Admitting: Radiation Oncology

## 2012-03-02 ENCOUNTER — Ambulatory Visit
Admission: RE | Admit: 2012-03-02 | Discharge: 2012-03-02 | Disposition: A | Discharge status: Home / Self Care | Payer: BC Managed Care – PPO | Source: Ambulatory Visit | Attending: Radiation Oncology | Admitting: Radiation Oncology

## 2012-03-05 ENCOUNTER — Ambulatory Visit
Admission: RE | Admit: 2012-03-05 | Discharge: 2012-03-05 | Disposition: A | Discharge status: Home / Self Care | Payer: BC Managed Care – PPO | Source: Ambulatory Visit | Attending: Radiation Oncology | Admitting: Radiation Oncology

## 2012-03-05 ENCOUNTER — Encounter: Payer: Self-pay | Admitting: Radiation Oncology

## 2012-03-05 VITALS — Wt 179.0 lb

## 2012-03-05 DIAGNOSIS — C50919 Malignant neoplasm of unspecified site of unspecified female breast: Secondary | ICD-10-CM

## 2012-03-05 MED ORDER — RADIAPLEXRX EX GEL
Freq: Once | CUTANEOUS | Status: AC
  Administered 2012-03-05: 09:00:00 via TOPICAL

## 2012-03-05 NOTE — Progress Notes (Signed)
Pt applying Radiaplex to tx area left breast, intermittent shooting pains in breast. Some fatigue at end of day.

## 2012-03-05 NOTE — Progress Notes (Signed)
Weekly Management Note:  Site:L Breast Current Dose:  5280  cGy Projected Dose: 6280  cGy  Narrative: The patient is seen today for routine under treatment assessment. CBCT/MVCT images/port films were reviewed. The chart was reviewed.   She is without complaints today. She uses Radioplex gel when necessary.  Physical Examination: There were no vitals filed for this visit..  Weight: 179 lb (81.194 kg). There is moderate erythema the skin with patchy dry desquamation along the inframammary region.  Impression: Tolerating radiation therapy well.  Plan: Continue radiation therapy as planned. She will finish her radiation therapy this Friday. She'll then see Dr. Donnie Coffin for adjuvant hormone therapy. I will check her skin later this week. One-month followup visit after completion of radiation therapy.

## 2012-03-06 ENCOUNTER — Ambulatory Visit
Admission: RE | Admit: 2012-03-06 | Discharge: 2012-03-06 | Disposition: A | Discharge status: Home / Self Care | Payer: BC Managed Care – PPO | Source: Ambulatory Visit | Attending: Radiation Oncology | Admitting: Radiation Oncology

## 2012-03-07 ENCOUNTER — Ambulatory Visit
Admission: RE | Admit: 2012-03-07 | Discharge: 2012-03-07 | Disposition: A | Discharge status: Home / Self Care | Payer: BC Managed Care – PPO | Source: Ambulatory Visit | Attending: Radiation Oncology | Admitting: Radiation Oncology

## 2012-03-08 ENCOUNTER — Ambulatory Visit: Payer: BC Managed Care – PPO | Admitting: Radiation Oncology

## 2012-03-08 ENCOUNTER — Ambulatory Visit
Admission: RE | Admit: 2012-03-08 | Discharge: 2012-03-08 | Disposition: A | Discharge status: Home / Self Care | Payer: BC Managed Care – PPO | Source: Ambulatory Visit | Attending: Radiation Oncology | Admitting: Radiation Oncology

## 2012-03-08 ENCOUNTER — Encounter: Payer: Self-pay | Admitting: Radiation Oncology

## 2012-03-08 NOTE — Progress Notes (Signed)
Weekly Management Note:  Site:L Breast Current Dose:  5880  cGy Projected Dose: 6080  cGy  Narrative: The patient is seen today for routine under treatment assessment. CBCT/MVCT images/port films were reviewed. The chart was reviewed.   No new complaints today. She continues with Radioplex gel.  Physical Examination: There were no vitals filed for this visit..  Weight:  . There is moderate erythema the skin the left breast with patchy dry desquamation. No areas of moist desquamation.  Impression: Tolerating radiation therapy well.  Plan: Continue radiation therapy as planned. She will finish her radiation therapy tomorrow and return to see me for a followup visit in one month.

## 2012-03-09 ENCOUNTER — Ambulatory Visit
Admission: RE | Admit: 2012-03-09 | Discharge: 2012-03-09 | Disposition: A | Discharge status: Home / Self Care | Payer: BC Managed Care – PPO | Source: Ambulatory Visit | Attending: Radiation Oncology | Admitting: Radiation Oncology

## 2012-03-12 ENCOUNTER — Encounter: Payer: Self-pay | Admitting: Radiation Oncology

## 2012-03-12 NOTE — Progress Notes (Signed)
[facility] Radiation Oncology End of Treatment Note  Name:Krystal Payne  Date: 03/12/2012 HQI:[id number] DOB:1945-12-04   Status:outpatient    CC: Dorothey Baseman, MD, MD  Dr. Emelia Loron  REFERRING PHYSICIAN: Dr. Emelia Loron    DIAGNOSIS: Stage I (T1, N0, M0) invasive ductal carcinoma of the left breast   INDICATION FOR TREATMENT: Curative   TREATMENT DATES: 01/25/2012 through 03/09/2012                          SITE/DOSE:   Left breast 4680 cGy 26 sessions, left breast boost 1400 cGy 7 sessions                         BEAMS/ENERGY:      Mixed 10 MV/18 MV photons tangential fields to the left breast with deep inspiration and breath-hold to avoid cardiac toxicity. Mixed 18 MV, 10 MV, and 6 MV photons left breast boost, free breathing             NARRATIVE:   The patient tolerated treatment well with moderate erythema the skin along the left breast by completion of therapy with dry desquamation the skin along the inframammary region. She used Radioplex gel during her course of therapy.                         PLAN: Routine followup in one month. Patient instructed to call if questions or worsening complaints in interim.

## 2012-03-19 ENCOUNTER — Encounter: Payer: Self-pay | Admitting: *Deleted

## 2012-03-21 ENCOUNTER — Ambulatory Visit: Payer: BC Managed Care – PPO | Admitting: Oncology

## 2012-03-23 ENCOUNTER — Encounter (INDEPENDENT_AMBULATORY_CARE_PROVIDER_SITE_OTHER): Payer: BC Managed Care – PPO | Admitting: General Surgery

## 2012-04-13 ENCOUNTER — Encounter: Payer: Self-pay | Admitting: Radiation Oncology

## 2012-04-13 DIAGNOSIS — Z923 Personal history of irradiation: Secondary | ICD-10-CM | POA: Insufficient documentation

## 2012-04-17 ENCOUNTER — Telehealth: Payer: Self-pay | Admitting: *Deleted

## 2012-04-17 ENCOUNTER — Encounter: Payer: Self-pay | Admitting: Radiation Oncology

## 2012-04-17 ENCOUNTER — Ambulatory Visit (HOSPITAL_BASED_OUTPATIENT_CLINIC_OR_DEPARTMENT_OTHER): Payer: BC Managed Care – PPO | Admitting: Oncology

## 2012-04-17 ENCOUNTER — Other Ambulatory Visit: Payer: Self-pay | Admitting: *Deleted

## 2012-04-17 ENCOUNTER — Ambulatory Visit
Admission: RE | Admit: 2012-04-17 | Discharge: 2012-04-17 | Disposition: A | Discharge status: Home / Self Care | Payer: BC Managed Care – PPO | Source: Ambulatory Visit | Attending: Radiation Oncology | Admitting: Radiation Oncology

## 2012-04-17 ENCOUNTER — Encounter (INDEPENDENT_AMBULATORY_CARE_PROVIDER_SITE_OTHER): Payer: Self-pay | Admitting: General Surgery

## 2012-04-17 ENCOUNTER — Other Ambulatory Visit (HOSPITAL_BASED_OUTPATIENT_CLINIC_OR_DEPARTMENT_OTHER): Payer: BC Managed Care – PPO | Admitting: Lab

## 2012-04-17 ENCOUNTER — Ambulatory Visit (INDEPENDENT_AMBULATORY_CARE_PROVIDER_SITE_OTHER): Payer: BC Managed Care – PPO | Admitting: General Surgery

## 2012-04-17 VITALS — BP 116/61 | HR 83 | Temp 98.0°F | Ht 65.0 in | Wt 181.8 lb

## 2012-04-17 VITALS — BP 118/76 | HR 76 | Temp 98.2°F | Ht 65.5 in | Wt 180.7 lb

## 2012-04-17 VITALS — BP 120/73 | HR 74 | Temp 97.8°F | Resp 20 | Wt 181.0 lb

## 2012-04-17 DIAGNOSIS — Z170 Estrogen receptor positive status [ER+]: Secondary | ICD-10-CM

## 2012-04-17 DIAGNOSIS — Z853 Personal history of malignant neoplasm of breast: Secondary | ICD-10-CM

## 2012-04-17 DIAGNOSIS — C50419 Malignant neoplasm of upper-outer quadrant of unspecified female breast: Secondary | ICD-10-CM

## 2012-04-17 DIAGNOSIS — M899 Disorder of bone, unspecified: Secondary | ICD-10-CM

## 2012-04-17 DIAGNOSIS — Z09 Encounter for follow-up examination after completed treatment for conditions other than malignant neoplasm: Secondary | ICD-10-CM

## 2012-04-17 DIAGNOSIS — M949 Disorder of cartilage, unspecified: Secondary | ICD-10-CM

## 2012-04-17 DIAGNOSIS — C50919 Malignant neoplasm of unspecified site of unspecified female breast: Secondary | ICD-10-CM

## 2012-04-17 DIAGNOSIS — E559 Vitamin D deficiency, unspecified: Secondary | ICD-10-CM

## 2012-04-17 LAB — CBC WITH DIFFERENTIAL/PLATELET
Basophils Absolute: 0.1 10*3/uL (ref 0.0–0.1)
EOS%: 11.3 % — ABNORMAL HIGH (ref 0.0–7.0)
Eosinophils Absolute: 0.6 10*3/uL — ABNORMAL HIGH (ref 0.0–0.5)
HCT: 41.6 % (ref 34.8–46.6)
HGB: 14 g/dL (ref 11.6–15.9)
MCH: 31.2 pg (ref 25.1–34.0)
MCV: 92.9 fL (ref 79.5–101.0)
MONO%: 7.8 % (ref 0.0–14.0)
NEUT#: 3.2 10*3/uL (ref 1.5–6.5)
NEUT%: 59.9 % (ref 38.4–76.8)
RDW: 13.3 % (ref 11.2–14.5)
lymph#: 1.1 10*3/uL (ref 0.9–3.3)

## 2012-04-17 LAB — COMPREHENSIVE METABOLIC PANEL
AST: 12 U/L (ref 0–37)
Albumin: 4.4 g/dL (ref 3.5–5.2)
BUN: 17 mg/dL (ref 6–23)
Calcium: 10.1 mg/dL (ref 8.4–10.5)
Chloride: 105 mEq/L (ref 96–112)
Creatinine, Ser: 0.68 mg/dL (ref 0.50–1.10)
Glucose, Bld: 84 mg/dL (ref 70–99)
Potassium: 4.9 mEq/L (ref 3.5–5.3)

## 2012-04-17 MED ORDER — ALENDRONATE SODIUM 35 MG PO TABS: 35.0000 mg | ORAL_TABLET | ORAL | Status: DC

## 2012-04-17 MED ORDER — ANASTROZOLE 1 MG PO TABS: 1.0000 mg | ORAL_TABLET | Freq: Every day | ORAL | Status: DC

## 2012-04-17 NOTE — Progress Notes (Signed)
Subjective:     Patient ID: Krystal Payne, female   DOB: 10/05/1946, 66 y.o.   MRN: [id number]  HPI This is a 66 year old female who underwent a lumpectomy and sentinel lymph node biopsy for a stage I left-sided breast cancer. She then underwent radiation therapy. She has been given a prescription for aromatase a better earlier today to begin. She reports no real complaints except for some tenderness and hardening at the site of her scar. She is otherwise doing well with no real concerns.  Review of Systems     Objective:   Physical Exam  Vitals reviewed. Constitutional: She appears well-developed and well-nourished.  Pulmonary/Chest: Right breast exhibits no inverted nipple, no mass, no nipple discharge, no skin change and no tenderness. Left breast exhibits tenderness (at lumpectomy scar). Left breast exhibits no inverted nipple, no mass, no nipple discharge and no skin change. Breasts are symmetrical.    Lymphadenopathy:    She has no cervical adenopathy.    She has no axillary adenopathy.       Right: No supraclavicular adenopathy present.       Left: No supraclavicular adenopathy present.       Assessment:    Stage 1 left breast cancer    Plan:     She is doing well and I told her she could use vitamin E lotion at site of lumpectomy scar and that will improve.  She is going to begin AI, get mmg in January and continue monthly exams.  I offered to see her annually for exam but she may just f/u with Dr. Donnie Coffin for exams every six months.

## 2012-04-17 NOTE — Telephone Encounter (Signed)
Made patient appointment for 07-25-2012 starting at 9:30am printed out calendar and gave to the patient made patient appointment for mammogram at the breast cancer

## 2012-04-17 NOTE — Patient Instructions (Signed)

## 2012-04-17 NOTE — Progress Notes (Signed)
Followup note:  Krystal Payne returns today approximately 5 weeks following completion of radiation therapy following conservative surgery in the management of her stage I (T1, N0, M0) invasive ductal carcinoma of the left breast. She is without complaints today. She is to meet with Dr. Dwain Sarna in Dr. Donnie Coffin later today.  Physical examination: Alert and oriented. Vital signs: Wt Readings from Last 3 Encounters:  04/17/12 181 lb (82.101 kg)  03/05/12 179 lb (81.194 kg)  02/27/12 180 lb 1.6 oz (81.693 kg)   Temp Readings from Last 3 Encounters:  04/17/12 97.8 F (36.6 C) Oral  01/12/12 98.6 F (37 C) Oral  01/10/12 97.5 F (36.4 C) Oral   BP Readings from Last 3 Encounters:  04/17/12 120/73  01/12/12 124/80  01/10/12 104/70   Pulse Readings from Last 3 Encounters:  04/17/12 74  01/12/12 66  01/10/12 74   Head and neck examination: Grossly unremarkable. Nodes: Without palpable cervical, supraclavicular, or axillary lymphadenopathy. Chest: Lungs clear. Breasts: There is residual erythema of the skin along the left breast. The lateral breast partial mastectomy wound is thickened. There is slight thickening of the left breast, no masses are appreciated. Right breast without masses or lesions. Abdomen: Without hepatomegaly. Extremities: Without edema.  Impression: Satisfactory progress. She'll discuss adjuvant hormone therapy with Dr. Donnie Coffin later today.  Plan: Followup through Dr. Dwain Sarna in Dr. Donnie Coffin. I've not scheduled her for a formal followup visit with me. She typically has her screening mammography in January, and I think she can [facility] a screening mammogram on the right and a diagnostic mammogram on the left in January of 2014. An appointment can be made by Dr. Dwain Sarna were Dr. Donnie Coffin.

## 2012-04-17 NOTE — Progress Notes (Signed)
Hematology and Oncology Follow Up Visit  Krystal Payne [id number] 07-Jul-1946 66 y.o. 04/17/2012 1:09 PM   DIAGNOSIS: Pleasant 66 year old woman history of T1 C. N0 ER/PR positive breast cancer status post lumpectomy 01/08/2012, status post radiation therapy completed May 2013  Encounter Diagnosis  Name Primary?  . Cancer of upper-outer quadrant of female breast Yes     PAST THERAPY: As above   Interim History:  She has been doing well since completion of radiation. She feels well, no complaints. Skin is healed well. She follows up with her surgeon next week. Be due for followup mammogram in December or January. She did bring a recent bone density test to this visit.  Medications: I have reviewed the patient's current medications.  Allergies: No Known Allergies  Past Medical History, Surgical history, Social history, and Family History were reviewed and updated.  Review of Systems: Constitutional:  Negative for fever, chills, night sweats, anorexia, weight loss, pain. Cardiovascular: no chest pain or dyspnea on exertion Respiratory: no cough, shortness of breath, or wheezing Neurological: negative Dermatological: negative ENT: negative Skin Gastrointestinal: negative Genito-Urinary: negative Hematological and Lymphatic: negative Breast: negative Musculoskeletal: negative Remaining ROS negative.  Physical Exam:  Blood pressure 118/76, pulse 76, temperature 98.2 F (36.8 C), height 5' 5.5" (1.664 m), weight 180 lb 11.2 oz (81.965 kg).  ECOG: 0 HEENT:  Sclerae anicteric, conjunctivae pink.  Oropharynx clear.  No mucositis or candidiasis.  Nodes:  No cervical, supraclavicular, or axillary lymphadenopathy palpated.  Breast Exam:  Right breast is benign.  No masses, discharge, skin change, or nipple inversion.  Left breast is benign.  No masses, discharge, skin change, or nipple inversion..  status post lumpectomy, there is some keloid formation on the scar lateral portion  of the breast. Lungs:  Clear to auscultation bilaterally.  No crackles, rhonchi, or wheezes.  Heart:  Regular rate and rhythm.  Abdomen:  Soft, nontender.  Positive bowel sounds.  No organomegaly or masses palpated.  Musculoskeletal:  No focal spinal tenderness to palpation.  Extremities:  Benign.  No peripheral edema or cyanosis.  Skin:  Benign.  Neuro:  Nonfocal.      Lab Results: Lab Results  Component Value Date   WBC 5.4 04/17/2012   HGB 14.0 04/17/2012   HCT 41.6 04/17/2012   MCV 92.9 04/17/2012   PLT 223 04/17/2012     Chemistry      Component Value Date/Time   NA 144 01/12/2012 1607   K 4.0 01/12/2012 1607   CL 109 01/12/2012 1607   CO2 29 01/12/2012 1607   BUN 22 01/12/2012 1607   CREATININE 0.82 01/12/2012 1607      Component Value Date/Time   CALCIUM 9.7 01/12/2012 1607   ALKPHOS 74 01/12/2012 1607   AST 13 01/12/2012 1607   ALT 15 01/12/2012 1607   BILITOT 0.3 01/12/2012 1607       Radiological Studies:  No results found.   IMPRESSIONS AND PLAN: A 66 y.o. female with   History of node-negative ER/PR positive breast cancer. We spent time today discussing adjuvant hormonal therapy using an aromatase inhibitor. I discussed side effects. I reviewed her bone density test which does show some osteopenia in the spine with fairly stable bone density in the hips. There is a decrease in the bone density of the hips and spine over the past year or so. As such I recommended she take Fosamax. I've also recommended she take vitamin D which he is doing. We will check a vitamin  D levels when she returns in 3 months time.  Spent more than half the time coordinating care.    Norbert Malkin 6/18/20131:09 PM cellulitis@[id number] @

## 2012-04-17 NOTE — Progress Notes (Signed)
Pt denies pain, fatigue. States she has been doing well. Seeing Dr Donnie Coffin today to discuss hormonal therapy.

## 2012-06-20 ENCOUNTER — Ambulatory Visit: Payer: Self-pay | Admitting: Gastroenterology

## 2012-07-20 ENCOUNTER — Telehealth: Payer: Self-pay | Admitting: *Deleted

## 2012-07-20 NOTE — Telephone Encounter (Signed)
Left voice message to inform the patient of the new date and time 08-28-2012 starting at 11:30am

## 2012-07-25 ENCOUNTER — Other Ambulatory Visit: Payer: BC Managed Care – PPO | Admitting: Lab

## 2012-07-25 ENCOUNTER — Ambulatory Visit: Payer: BC Managed Care – PPO | Admitting: Family

## 2012-08-28 ENCOUNTER — Ambulatory Visit (HOSPITAL_BASED_OUTPATIENT_CLINIC_OR_DEPARTMENT_OTHER): Payer: BC Managed Care – PPO | Admitting: Oncology

## 2012-08-28 ENCOUNTER — Telehealth: Payer: Self-pay | Admitting: Oncology

## 2012-08-28 ENCOUNTER — Other Ambulatory Visit (HOSPITAL_BASED_OUTPATIENT_CLINIC_OR_DEPARTMENT_OTHER): Payer: BC Managed Care – PPO | Admitting: Lab

## 2012-08-28 VITALS — BP 124/77 | HR 75 | Temp 98.0°F | Resp 20 | Ht 65.0 in | Wt 183.0 lb

## 2012-08-28 DIAGNOSIS — C50419 Malignant neoplasm of upper-outer quadrant of unspecified female breast: Secondary | ICD-10-CM

## 2012-08-28 DIAGNOSIS — Z170 Estrogen receptor positive status [ER+]: Secondary | ICD-10-CM

## 2012-08-28 DIAGNOSIS — C50919 Malignant neoplasm of unspecified site of unspecified female breast: Secondary | ICD-10-CM

## 2012-08-28 LAB — CBC WITH DIFFERENTIAL/PLATELET
BASO%: 0.8 % (ref 0.0–2.0)
EOS%: 3.4 % (ref 0.0–7.0)
LYMPH%: 22.9 % (ref 14.0–49.7)
MCHC: 34 g/dL (ref 31.5–36.0)
MONO#: 0.3 10*3/uL (ref 0.1–0.9)
Platelets: 225 10*3/uL (ref 145–400)
RBC: 4.45 10*6/uL (ref 3.70–5.45)
WBC: 4.9 10*3/uL (ref 3.9–10.3)

## 2012-08-28 LAB — COMPREHENSIVE METABOLIC PANEL (CC13)
ALT: 28 U/L (ref 0–55)
AST: 17 U/L (ref 5–34)
Alkaline Phosphatase: 77 U/L (ref 40–150)
Calcium: 10.3 mg/dL (ref 8.4–10.4)
Chloride: 107 mEq/L (ref 98–107)
Creatinine: 0.8 mg/dL (ref 0.6–1.1)
Total Bilirubin: 0.44 mg/dL (ref 0.20–1.20)

## 2012-08-28 NOTE — Progress Notes (Signed)
Hematology and Oncology Follow Up Visit  DUJUANA TRAIN [id number] 18-Apr-1946 66 y.o. 08/28/2012 12:55 PM   DIAGNOSIS: Pleasant 66 year old woman history of T1 C. N0 ER/PR positive breast cancer status post lumpectomy 01/08/2012, status post radiation therapy completed May 2013  No diagnosis found.   PAST THERAPY: As above   Interim History:  She has been doing well since completion of radiation. She feels well, no complaints. Skin is healed well. She follows up with her surgeon next week. Be due for followup mammogram in December or January. She did bring a recent bone density test to this visit.  Medications: I have reviewed the patient's current medications.  Allergies: No Known Allergies  Past Medical History, Surgical history, Social history, and Family History were reviewed and updated.  Review of Systems: Constitutional:  Negative for fever, chills, night sweats, anorexia, weight loss, pain. Cardiovascular: no chest pain or dyspnea on exertion Respiratory: no cough, shortness of breath, or wheezing Neurological: negative Dermatological: negative ENT: negative Skin Gastrointestinal: negative Genito-Urinary: negative Hematological and Lymphatic: negative Breast: negative Musculoskeletal: negative Remaining ROS negative.  Physical Exam:  Blood pressure 124/77, pulse 75, temperature 98 F (36.7 C), resp. rate 20, height 5\' 5"  (1.651 m), weight 183 lb (83.008 kg).  ECOG: 0 HEENT:  Sclerae anicteric, conjunctivae pink.  Oropharynx clear.  No mucositis or candidiasis.  Nodes:  No cervical, supraclavicular, or axillary lymphadenopathy palpated.  Breast Exam:  Right breast is benign.  No masses, discharge, skin change, or nipple inversion.  Left breast is benign.  No masses, discharge, skin change, or nipple inversion..  status post lumpectomy, there is some keloid formation on the scar lateral portion of the breast. Lungs:  Clear to auscultation bilaterally.  No crackles,  rhonchi, or wheezes.  Heart:  Regular rate and rhythm.  Abdomen:  Soft, nontender.  Positive bowel sounds.  No organomegaly or masses palpated.  Musculoskeletal:  No focal spinal tenderness to palpation.  Extremities:  Benign.  No peripheral edema or cyanosis.  Skin:  Benign.  Neuro:  Nonfocal.      Lab Results: Lab Results  Component Value Date   WBC 4.9 08/28/2012   HGB 14.2 08/28/2012   HCT 41.8 08/28/2012   MCV 93.8 08/28/2012   PLT 225 08/28/2012     Chemistry      Component Value Date/Time   NA 141 08/28/2012 1128   NA 141 04/17/2012 1126   K 4.5 08/28/2012 1128   K 4.9 04/17/2012 1126   CL 107 08/28/2012 1128   CL 105 04/17/2012 1126   CO2 29 08/28/2012 1128   CO2 26 04/17/2012 1126   BUN 19.0 08/28/2012 1128   BUN 17 04/17/2012 1126   CREATININE 0.8 08/28/2012 1128   CREATININE 0.68 04/17/2012 1126      Component Value Date/Time   CALCIUM 10.3 08/28/2012 1128   CALCIUM 10.1 04/17/2012 1126   ALKPHOS 77 08/28/2012 1128   ALKPHOS 70 04/17/2012 1126   AST 17 08/28/2012 1128   AST 12 04/17/2012 1126   ALT 28 08/28/2012 1128   ALT 10 04/17/2012 1126   BILITOT 0.44 08/28/2012 1128   BILITOT 0.4 04/17/2012 1126       Radiological Studies:  No results found.   IMPRESSIONS AND PLAN: A 66 y.o. female with   History of node-negative ER/PR positive breast cancer. We spent time today discussing adjuvant hormonal therapy using an aromatase inhibitor. I discussed side effects. I reviewed her bone density test which does show some osteopenia  in the spine with fairly stable bone density in the hips. There is a decrease in the bone density of the hips and spine over the past year or so. As such I recommended she take Fosamax. I've also recommended she take vitamin D which he is doing. We will check a vitamin D levels when she returns in 3 months time.  Spent more than half the time coordinating care.    Kent Riendeau 10/29/201312:55 PM Cell @[id number] @

## 2012-08-28 NOTE — Telephone Encounter (Signed)
gve the pt her may 2014 appt calendar along with the dec mammo appt. Pt is aware her insurance may not pay for this mammo since it is being done earlier than her due date which was the pt's request.

## 2012-08-28 NOTE — Progress Notes (Signed)
Hematology and Oncology Follow Up Visit  Krystal Payne [id number] 21-Sep-1946 66 y.o. 08/28/2012 12:47 PM   DIAGNOSIS: Pleasant 66 year old woman history of T1 C. N0 ER/PR positive breast cancer status post lumpectomy 01/08/2012, status post radiation therapy completed May 2013    PAST THERAPY: As above Continue radiation therapy as noted, currently on Arimidex and Fosamax and vitamin D supplementation.  Interim History:  She has been doing well since completion of radiation. She feels well, no complaints. Skin is healed well. As noted she tolerates Fosamax Arimidex well. She has no other complaints. She will be due for followup mammogram which we will schedule for December of this year .  Medications: I have reviewed the patient's current medications.  Allergies: No Known Allergies  Past Medical History, Surgical history, Social history, and Family History were reviewed and updated.  Review of Systems: Constitutional:  Negative for fever, chills, night sweats, anorexia, weight loss, pain. Cardiovascular: no chest pain or dyspnea on exertion Respiratory: no cough, shortness of breath, or wheezing Neurological: negative Dermatological: negative ENT: negative Skin Gastrointestinal: negative Genito-Urinary: negative Hematological and Lymphatic: negative Breast: negative Musculoskeletal: negative Remaining ROS negative.  Physical Exam:  Blood pressure 124/77, pulse 75, temperature 98 F (36.7 C), resp. rate 20, height 5\' 5"  (1.651 m), weight 183 lb (83.008 kg).  ECOG: 0 HEENT:  Sclerae anicteric, conjunctivae pink.  Oropharynx clear.  No mucositis or candidiasis.  Nodes:  No cervical, supraclavicular, or axillary lymphadenopathy palpated.  Breast Exam:  Right breast is benign.  No masses, discharge, skin change, or nipple inversion.  Left breast is benign.  No masses, discharge, skin change, or nipple inversion..  status post lumpectomy, there is some keloid formation on the  scar lateral portion of the breast. Lungs:  Clear to auscultation bilaterally.  No crackles, rhonchi, or wheezes.  Heart:  Regular rate and rhythm.  Abdomen:  Soft, nontender.  Positive bowel sounds.  No organomegaly or masses palpated.  Musculoskeletal:  No focal spinal tenderness to palpation.  Extremities:  Benign.  No peripheral edema or cyanosis.  Skin:  Benign.  Neuro:  Nonfocal.      Lab Results: Lab Results  Component Value Date   WBC 4.9 08/28/2012   HGB 14.2 08/28/2012   HCT 41.8 08/28/2012   MCV 93.8 08/28/2012   PLT 225 08/28/2012     Chemistry      Component Value Date/Time   NA 141 08/28/2012 1128   NA 141 04/17/2012 1126   K 4.5 08/28/2012 1128   K 4.9 04/17/2012 1126   CL 107 08/28/2012 1128   CL 105 04/17/2012 1126   CO2 29 08/28/2012 1128   CO2 26 04/17/2012 1126   BUN 19.0 08/28/2012 1128   BUN 17 04/17/2012 1126   CREATININE 0.8 08/28/2012 1128   CREATININE 0.68 04/17/2012 1126      Component Value Date/Time   CALCIUM 10.3 08/28/2012 1128   CALCIUM 10.1 04/17/2012 1126   ALKPHOS 77 08/28/2012 1128   ALKPHOS 70 04/17/2012 1126   AST 17 08/28/2012 1128   AST 12 04/17/2012 1126   ALT 28 08/28/2012 1128   ALT 10 04/17/2012 1126   BILITOT 0.44 08/28/2012 1128   BILITOT 0.4 04/17/2012 1126       Radiological Studies:  No results found.   IMPRESSIONS AND PLAN: A 66 y.o. female with   History of node-negative ER/PR positive breast cancer. She is doing well. She is taking vitamin D. As noted we'll get a followup mammogram.  I will see her in 6 months time. Spent more than half the time coordinating care.    Krystal Payne 10/29/201312:47 PM cellulitis@[id number] @

## 2012-08-29 LAB — VITAMIN D 25 HYDROXY (VIT D DEFICIENCY, FRACTURES): Vit D, 25-Hydroxy: 52 ng/mL (ref 30–89)

## 2012-11-07 ENCOUNTER — Other Ambulatory Visit: Payer: Self-pay | Admitting: Oncology

## 2012-11-08 NOTE — Telephone Encounter (Signed)
Per MD oncall. Dr Donnie Coffin no longer at [facility]. Pt to be reassigned to new MD.

## 2012-12-05 ENCOUNTER — Ambulatory Visit
Admission: RE | Admit: 2012-12-05 | Discharge: 2012-12-05 | Disposition: A | Discharge status: Home / Self Care | Payer: BC Managed Care – PPO | Source: Ambulatory Visit | Attending: Oncology | Admitting: Oncology

## 2012-12-05 DIAGNOSIS — C50419 Malignant neoplasm of upper-outer quadrant of unspecified female breast: Secondary | ICD-10-CM

## 2013-02-21 ENCOUNTER — Telehealth: Payer: Self-pay | Admitting: *Deleted

## 2013-02-21 NOTE — Telephone Encounter (Signed)
Called and spoke with patient to reschedule her appt. Confirmed appt. With Bernell List 03/26/13 at 0900.  Then will become Dr. Darnelle Catalan.

## 2013-03-11 ENCOUNTER — Telehealth: Payer: Self-pay | Admitting: *Deleted

## 2013-03-11 NOTE — Telephone Encounter (Signed)
Pt called to cancel her appt for 5/27 and r/s for 04/01/13. gv appt d/t for 04/01/13 @ 9am.pt is aware....td

## 2013-03-26 ENCOUNTER — Ambulatory Visit: Payer: BC Managed Care – PPO | Admitting: Family

## 2013-03-27 ENCOUNTER — Ambulatory Visit: Payer: BC Managed Care – PPO | Admitting: Oncology

## 2013-03-27 ENCOUNTER — Other Ambulatory Visit: Payer: BC Managed Care – PPO | Admitting: Lab

## 2013-04-01 ENCOUNTER — Encounter: Payer: Self-pay | Admitting: Oncology

## 2013-04-01 ENCOUNTER — Telehealth: Payer: Self-pay | Admitting: Oncology

## 2013-04-01 ENCOUNTER — Ambulatory Visit (HOSPITAL_BASED_OUTPATIENT_CLINIC_OR_DEPARTMENT_OTHER): Payer: Medicare Other | Admitting: Oncology

## 2013-04-01 ENCOUNTER — Ambulatory Visit (HOSPITAL_BASED_OUTPATIENT_CLINIC_OR_DEPARTMENT_OTHER): Payer: Medicare Other | Admitting: Lab

## 2013-04-01 ENCOUNTER — Ambulatory Visit (INDEPENDENT_AMBULATORY_CARE_PROVIDER_SITE_OTHER): Payer: BC Managed Care – PPO | Admitting: General Surgery

## 2013-04-01 VITALS — BP 123/81 | HR 66 | Temp 98.1°F | Resp 20 | Ht 65.0 in | Wt 183.5 lb

## 2013-04-01 DIAGNOSIS — C50412 Malignant neoplasm of upper-outer quadrant of left female breast: Secondary | ICD-10-CM

## 2013-04-01 DIAGNOSIS — E559 Vitamin D deficiency, unspecified: Secondary | ICD-10-CM

## 2013-04-01 DIAGNOSIS — C50419 Malignant neoplasm of upper-outer quadrant of unspecified female breast: Secondary | ICD-10-CM

## 2013-04-01 DIAGNOSIS — C50912 Malignant neoplasm of unspecified site of left female breast: Secondary | ICD-10-CM

## 2013-04-01 DIAGNOSIS — C50919 Malignant neoplasm of unspecified site of unspecified female breast: Secondary | ICD-10-CM

## 2013-04-01 DIAGNOSIS — M949 Disorder of cartilage, unspecified: Secondary | ICD-10-CM

## 2013-04-01 DIAGNOSIS — N9489 Other specified conditions associated with female genital organs and menstrual cycle: Secondary | ICD-10-CM

## 2013-04-01 LAB — CBC WITH DIFFERENTIAL/PLATELET
Basophils Absolute: 0.1 10*3/uL (ref 0.0–0.1)
EOS%: 1.8 % (ref 0.0–7.0)
Eosinophils Absolute: 0.1 10*3/uL (ref 0.0–0.5)
HCT: 42.5 % (ref 34.8–46.6)
HGB: 14 g/dL (ref 11.6–15.9)
LYMPH%: 20.7 % (ref 14.0–49.7)
MCH: 30.7 pg (ref 25.1–34.0)
MCV: 93.1 fL (ref 79.5–101.0)
MONO%: 5.9 % (ref 0.0–14.0)
NEUT#: 4.3 10*3/uL (ref 1.5–6.5)
NEUT%: 70.7 % (ref 38.4–76.8)
Platelets: 223 10*3/uL (ref 145–400)
RDW: 13.2 % (ref 11.2–14.5)

## 2013-04-01 LAB — COMPREHENSIVE METABOLIC PANEL (CC13)
BUN: 18.8 mg/dL (ref 7.0–26.0)
CO2: 26 mEq/L (ref 22–29)
Glucose: 100 mg/dl — ABNORMAL HIGH (ref 70–99)
Sodium: 141 mEq/L (ref 136–145)
Total Bilirubin: 0.48 mg/dL (ref 0.20–1.20)
Total Protein: 7.3 g/dL (ref 6.4–8.3)

## 2013-04-01 LAB — LACTATE DEHYDROGENASE (CC13): LDH: 180 U/L (ref 125–245)

## 2013-04-01 NOTE — Patient Instructions (Addendum)
Please contact us at [phone] if you have any questions or concerns.  Please continue to do well and enjoy life!!!  Get/Continue plenty of rest, drink plenty of water, exercise daily, eat a balanced diet.  Continue to take Vitamin D3 2000 IUs daily.   Consider coconut oil.
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
## 2013-04-01 NOTE — Telephone Encounter (Signed)
Pt sent back to lb and given appt schedule for December. Per 6/2 pof cx appt w/Dr. Dwain Sarna for today and r/s for September. S/w Sabrina @ [facility] and pt on recall for September - appt for today cx'd.

## 2013-04-02 ENCOUNTER — Telehealth: Payer: Self-pay

## 2013-04-02 NOTE — Telephone Encounter (Signed)
LMOVM regarding lab results for 6/2. Per [facility], labs are ok. Call the office with any questions. TMB

## 2013-04-04 ENCOUNTER — Other Ambulatory Visit: Payer: Self-pay | Admitting: *Deleted

## 2013-04-04 DIAGNOSIS — C50419 Malignant neoplasm of upper-outer quadrant of unspecified female breast: Secondary | ICD-10-CM

## 2013-04-04 MED ORDER — ALENDRONATE SODIUM 35 MG PO TABS: 35.0000 mg | ORAL_TABLET | ORAL | Status: DC

## 2013-04-04 MED ORDER — ANASTROZOLE 1 MG PO TABS: 1.0000 mg | ORAL_TABLET | Freq: Every day | ORAL | Status: DC

## 2013-04-04 NOTE — Telephone Encounter (Signed)
Message left by pt stating need to obtain 90 day supply for fosamax and arimidex ( generic ) for her mail order pharmacy. This RN called pt to inquire how she would like to obtain prescriptions ( she did not leave name of mail order pharmacy ). Unable to speak with pt directly- prescriptions placed in mail.

## 2013-06-05 ENCOUNTER — Other Ambulatory Visit: Payer: Self-pay

## 2013-07-29 ENCOUNTER — Ambulatory Visit (INDEPENDENT_AMBULATORY_CARE_PROVIDER_SITE_OTHER): Payer: No Typology Code available for payment source | Admitting: General Surgery

## 2013-07-29 ENCOUNTER — Encounter (INDEPENDENT_AMBULATORY_CARE_PROVIDER_SITE_OTHER): Payer: Self-pay | Admitting: General Surgery

## 2013-07-29 VITALS — BP 110/74 | HR 85 | Temp 97.7°F | Ht 65.0 in | Wt 186.2 lb

## 2013-07-29 DIAGNOSIS — C50919 Malignant neoplasm of unspecified site of unspecified female breast: Secondary | ICD-10-CM

## 2013-07-29 DIAGNOSIS — C50912 Malignant neoplasm of unspecified site of left female breast: Secondary | ICD-10-CM

## 2013-07-29 NOTE — Progress Notes (Signed)
Subjective:     Patient ID: Krystal Payne, female   DOB: October 19, 1946, 67 y.o.   MRN: [id number]  HPI This is a 67 year old female who underwent a left breast lumpectomy and sentinel lymph node biopsy for a remote receptor positive stage I left breast cancer. She eventually underwent adjuvant radiation therapy followed by an anastrozole  She is tolerating the anastrozole well. She has a normal mammogram in January of last year it is due again at the beginning of next year. She notes no changes on her self breast examination. She has otherwise been doing well. Her husband has been treated for prostate cancer in the interim since I had last seen her.  Review of Systems  Constitutional: Negative for fever, chills and unexpected weight change.  HENT: Negative for hearing loss, congestion, sore throat, trouble swallowing and voice change.   Eyes: Negative for visual disturbance.  Respiratory: Negative for cough and wheezing.   Cardiovascular: Negative for chest pain, palpitations and leg swelling.  Gastrointestinal: Negative for nausea, vomiting, abdominal pain, diarrhea, constipation, blood in stool, abdominal distention and anal bleeding.  Genitourinary: Negative for hematuria, vaginal bleeding and difficulty urinating.  Musculoskeletal: Negative for arthralgias.  Skin: Negative for rash and wound.  Neurological: Negative for seizures, syncope and headaches.  Hematological: Negative for adenopathy. Does not bruise/bleed easily.  Psychiatric/Behavioral: Negative for confusion.   DIGITAL DIAGNOSTIC BILATERAL MAMMOGRAM WITH CAD  Comparison: 01/02/2012, 12/15/2011, dating back to 05/04/2006.  Findings:  ACR Breast Density Category 3: The breast tissue is heterogeneously  dense.  CC and MLO views of both breasts and a spot tangential view of the  left breast at the lumpectomy site were obtained. Post lumpectomy  scarring in the upper outer left breast, with surgical clips in  place. No new  suspicious mass, nonsurgical architectural  distortion or suspicious calcifications in either breast.  Mammographic images were processed with CAD.  IMPRESSION:  No specific mammographic evidence of malignancy. Post lumpectomy  scarring in the upper outer left breast.  RECOMMENDATION:  Bilateral diagnostic mammography in 1 year.  The patient was encouraged to perform monthly self breast  examination and communicate any changes with her primary physician.  I have discussed the findings and recommendations with the patient.  Results were also provided in writing at the conclusion of the  visit.  BI-RADS CATEGORY 2: Benign finding(s).     Objective:   Physical Exam  Vitals reviewed. Constitutional: She appears well-developed and well-nourished.  Cardiovascular: Normal rate, regular rhythm and normal heart sounds.   Pulmonary/Chest: Effort normal and breath sounds normal. She has no wheezes. Right breast exhibits no inverted nipple, no mass, no nipple discharge, no skin change and no tenderness. Left breast exhibits no inverted nipple, no mass, no nipple discharge, no skin change and no tenderness.    Lymphadenopathy:    She has no cervical adenopathy.    She has no axillary adenopathy.       Right: No supraclavicular adenopathy present.       Left: No supraclavicular adenopathy present.       Assessment:     History of stage I left breast cancer     Plan:     She has no clinical evidence of recurrence.  She is up to date on mammograms.  She is tolerating her AI well.  She is going to see Dr Darnelle Catalan in 3 months.  I told her today she needed every 6 month follow up and I would be happy to  split these visits with them or if she is going to see them at those intervals she could see me as needed

## 2013-07-29 NOTE — Patient Instructions (Signed)
Breast Self-Examination You should begin examining your breasts at age 65 even though the risk for breast cancer is low in this age group. It is important to become familiar with how your breasts look and feel. This is true for pregnant women, nursing mothers, women in menopause and women who have breast implants.  Women should examine their breasts once a month to look for changes and lumps. By doing monthly breast exams, you get to know how your breasts feel and how they can change from month to month. This allows you to pick up changes early. It can also offer you some reassurance that your breast health is good. This exam only takes minutes. Most breast lumps are not caused by cancer. If you find a lump, a special x-ray called a mammogram, or other tests may be needed to determine what is wrong.  Some of the signs that a breast lump is caused by cancer include:  Dimpling of the skin or changes in the shape of the breast or nipple.   A dark-colored or bloody discharge from the nipple.   Swollen lymph glands around the breast or in the armpit.   Redness of the breast or nipple.   Scaly nipple or skin on the breast.   Pain or swelling of the breast.  SELF-EXAM There are a few points to follow when doing a thorough breast exam. The best time to examine your breasts is 5 to 7 days after the menstrual period is over. During menstruation, the breasts are lumpier, and it may be more difficult to pick up changes. If you do not menstruate, have reached menopause or had a hysterectomy, examine your breasts the first day of every month. After three to four months, you will become more familiar with the variations of your breasts and more comfortable with the exam.  Perform your breast exam monthly. Keep a written record with breast changes or normal findings for each breast. This makes it easier to be sure of changes and to not solely depend on memory for size, tenderness, or location. Try to do the exam  at the same time each month, and write down where you are in your menstrual cycle if you are still menstruating.   Look at your breasts. Stand in front of a mirror with your hands clasped behind your head. Tighten your chest muscles and look for asymmetry. This means a difference in shape or contour from one breast to the other, such as puckers, dips or bumps. Look also for skin changes.   Lean forward with your hands on your hips. Again, look for symmetry and skin changes.   While showering, soap the breasts, and carefully feel the breasts with fingertips while holding the arm (on the side of the breast being examined) over the head. Do this with each breast carefully feeling for lumps or changes. Typically, a circular motion with moderate fingertip pressure should be used.   Repeat this exam while lying on your back, again with your arm behind your head and a pillow under your shoulders. Again, use your fingertips to examine both breasts, feeling for lumps and thickening. Begin at 1 o'clock and go clockwise around the whole breast.   At the end of your exam, gently squeeze each nipple to see if there is any drainage. Look for nipple changes, dimpling or redness.   Lastly, examine the upper chest and clavicle areas and in your armpits.  It is not necessary to become alarmed if you find   a breast lump. Most of them are not cancerous. However, it is necessary to see your caregiver if a lump is found in order to have it looked at. Document Released: 11/24/2004 Document Revised: 06/29/2011 Document Reviewed: 02/03/2009 [employer] Patient Information 2012 [employer]. 

## 2013-09-05 ENCOUNTER — Other Ambulatory Visit: Payer: Self-pay

## 2013-10-01 ENCOUNTER — Ambulatory Visit (HOSPITAL_BASED_OUTPATIENT_CLINIC_OR_DEPARTMENT_OTHER): Payer: Medicare Other | Admitting: Oncology

## 2013-10-01 ENCOUNTER — Telehealth: Payer: Self-pay | Admitting: Oncology

## 2013-10-01 ENCOUNTER — Other Ambulatory Visit (HOSPITAL_BASED_OUTPATIENT_CLINIC_OR_DEPARTMENT_OTHER): Payer: Medicare Other | Admitting: Lab

## 2013-10-01 ENCOUNTER — Other Ambulatory Visit: Payer: Self-pay | Admitting: *Deleted

## 2013-10-01 VITALS — BP 121/74 | HR 83 | Temp 98.0°F | Resp 18 | Ht 65.0 in | Wt 186.7 lb

## 2013-10-01 DIAGNOSIS — Z170 Estrogen receptor positive status [ER+]: Secondary | ICD-10-CM | POA: Insufficient documentation

## 2013-10-01 DIAGNOSIS — C50919 Malignant neoplasm of unspecified site of unspecified female breast: Secondary | ICD-10-CM

## 2013-10-01 DIAGNOSIS — C50412 Malignant neoplasm of upper-outer quadrant of left female breast: Secondary | ICD-10-CM

## 2013-10-01 DIAGNOSIS — C50419 Malignant neoplasm of upper-outer quadrant of unspecified female breast: Secondary | ICD-10-CM

## 2013-10-01 DIAGNOSIS — M899 Disorder of bone, unspecified: Secondary | ICD-10-CM

## 2013-10-01 LAB — COMPREHENSIVE METABOLIC PANEL (CC13)
ALT: 13 U/L (ref 0–55)
AST: 13 U/L (ref 5–34)
Calcium: 9.7 mg/dL (ref 8.4–10.4)
Chloride: 108 mEq/L (ref 98–109)
Creatinine: 0.8 mg/dL (ref 0.6–1.1)
Potassium: 4.3 mEq/L (ref 3.5–5.1)
Sodium: 144 mEq/L (ref 136–145)
Total Protein: 7 g/dL (ref 6.4–8.3)

## 2013-10-01 LAB — CBC WITH DIFFERENTIAL/PLATELET
BASO%: 0.2 % (ref 0.0–2.0)
EOS%: 5.8 % (ref 0.0–7.0)
MCH: 31.3 pg (ref 25.1–34.0)
MCHC: 33.1 g/dL (ref 31.5–36.0)
RBC: 4.34 10*6/uL (ref 3.70–5.45)
RDW: 12.9 % (ref 11.2–14.5)
lymph#: 1.4 10*3/uL (ref 0.9–3.3)

## 2013-10-01 MED ORDER — ANASTROZOLE 1 MG PO TABS: 1.0000 mg | ORAL_TABLET | Freq: Every day | ORAL | Status: DC

## 2013-10-01 NOTE — Addendum Note (Signed)
Addended by: Laroy Apple E on: 10/01/2013 12:53 PM   Modules accepted: Orders

## 2013-10-01 NOTE — Progress Notes (Signed)
Krystal Payne  [phone] [phone]  OFFICE PROGRESS NOTE   ID: Krystal Payne   DOB: 09/14/1946  MR#: [id number]  JYN#:[id number]   PCP: Krystal Baseman, MD GYN:  SU: Krystal Payne, M.D. RAD ONC: Krystal Payne, M.D.   HISTORY OF PRESENT ILLNESS: From Dr. Theron Arista Rubin's new patient evaluation note dated 01/12/2012: "Delightful 67 year old woman in excellent health who presents with an abnormal mammogram. Screening mammogram 12/01/2011 showed a possible mass in the left breast. Diagnostic mammogram and ultrasound performed on 12/15/2011 showed a subtle the palpable mass at 3:00 position. On ultrasound this measured 1.1 x 0.8 x 0.7 cm. A biopsy was performed on 12/15/2011 which showed invasive mammary carcinoma grade 1/2 ER +100% PR +100%, proliferative index 6% HER-2 was amplified with a ratio of 1.36 bilateral MRI exam on 12/21/2011 showed a solitary 11 x 9 x 9 mm mass is doing third of her left breast. No other abnormalities were seen in a suspicious lesion in the liver was likely a felt to be a cyst. The patient underwent a lumpectomy and sentinel lymph node evaluation on 01/02/2012. Final pathology showed a 1.1 cm grade 1/2 cancer with 1 and negative sentinel lymph node and clear surgical margins. She does have an unremarkable postoperative course. A. intraoperatively in the MammoSite catheter was placed subsequently this was removed and was realized that the distance to the skin was too close. She has been referred for external beam irradiation and it seems as for a medical oncology consultation."  Her subsequent history is as detailed below.   INTERVAL HISTORY: Krystal Payne returns today for followup of her breast cancer. She is doing "very good". She did a lot of cooking and needing over the holidays and had her children and 2 grandchildren over. She exercises at [employer] up to 4 times a week.  REVIEW OF SYSTEMS: She is tolerating the  anastrozole without significant side effects. She has minimal hot flashes. She denies vaginal dryness issues. She does not have arthralgias/myalgias although sometimes in the morning her hands feel a little bit stiff. A detailed review of systems today was otherwise noncontributory  PAST MEDICAL HISTORY: Past Medical History  Diagnosis Date  . Hyperlipidemia   . Breast cancer     left breast  . History of radiation therapy 01/25/12 - 03/09/12    left breast  . Urgency of urination   . Arm fracture, left 1969  . Basal cell carcinoma     By midsternal area    PAST SURGICAL HISTORY: Past Surgical History  Procedure Laterality Date  . Breast biopsy      left  . Breast lumpectomy      left lumpectomy, snbx  . Vaginal hysterectomy  1985    FAMILY HISTORY Family History  Problem Relation Age of Onset  . Heart disease Mother   . Diabetes Mother   . Heart disease Sister   . Cancer Brother     colon//under treatment  . Cancer Maternal Aunt     breast/dx 16 years ago/currently 63  . Alzheimer's disease Father   . COPD Brother   . Heart disease Brother   . Heart disease Brother   . Heart attack Brother   . Lung disease Brother   She is one of 12 children in her family; one sibling has died of myocardial infarction. She has another brother who was diagnosed with stage IIIC colon cancer. She is a strong family history of colonic polyps and  her family including herself.    GYNECOLOGIC HISTORY: G2  P2, menarche 75,  menopause at time of hysterectomy in 1985 and ovaries were retained.   SOCIAL HISTORY: The patient has been married to Krystal" Payne since 1970.  He is a retired Paediatric nurse.  She retired from being a Pensions consultant in 2009.  The patient and her husband have 2 adult children, 1 son and 1 daughter that both live in [place], [place]. They have 2 grandchildren who as of 21 were 65 and 49 years old. The patient enjoys going to the beach, antiquing,  distinct music and doing pro bono patient advocate activities in her spare time.  She also participates in [employer] at the [facility] 3-4 times weekly.  ADVANCED DIRECTIVES:  The patient states she has a living will.  HEALTH MAINTENANCE: History  Substance Use Topics  . Smoking status: Former Smoker -- 0.25 packs/day for 2 years    Types: Cigarettes    Quit date: 10/31/1968  . Smokeless tobacco: Never Used  . Alcohol Use: Yes     Comment: occasional beer or wine    Colonoscopy:  UTD PAP: UTD Bone density:  The patient's last bone density scan on 03/14/2012 showed a T score of -1.8 (osteopenia). Lipid panel: Not on file  No Known Allergies  Current Outpatient Prescriptions  Medication Sig Dispense Refill  . alendronate (FOSAMAX) 35 MG tablet Take 1 tablet (35 mg total) by mouth every 7 (seven) days. Take with a full glass of water on an empty stomach.  12 tablet  4  . anastrozole (ARIMIDEX) 1 MG tablet Take 1 tablet (1 mg total) by mouth daily.  90 tablet  3  . cholecalciferol (VITAMIN D) 1000 UNITS tablet Take 2,000 Units by mouth daily.      Marland Kitchen oxybutynin (DITROPAN) 5 MG tablet Take 5 mg by mouth daily.       . simvastatin (ZOCOR) 20 MG tablet Take 20 mg by mouth every evening.       No current facility-administered medications for this visit.    OBJECTIVE: Middle-aged white woman in no acute distress Filed Vitals:   10/01/13 0927  BP: 121/74  Pulse: 83  Temp: 98 F (36.7 C)  Resp: 18     Body mass index is 31.07 kg/(m^2).      ECOG FS: 0 - Asymptomatic  Sclerae unicteric, pupils equal and reactive Oropharynx clear and moist-- no thrush No cervical or supraclavicular adenopathy Lungs no rales or rhonchi Heart regular rate and rhythm Abd soft, nontender, positive bowel sounds MSK no focal spinal tenderness, no upper extremity lymphedema Neuro: nonfocal, well oriented, appropriate affect Breasts: The right breast is unremarkable. The left breast is status post  lumpectomy and radiation. There is no evidence of local recurrence. The left axilla is benign   LAB RESULTS: Lab Results  Component Value Date   WBC 4.7 10/01/2013   NEUTROABS 2.7 10/01/2013   HGB 13.6 10/01/2013   HCT 41.1 10/01/2013   MCV 94.7 10/01/2013   PLT 225 10/01/2013      Chemistry      Component Value Date/Time   NA 141 04/01/2013 1031   NA 141 04/17/2012 1126   K 4.1 04/01/2013 1031   K 4.9 04/17/2012 1126   CL 106 04/01/2013 1031   CL 105 04/17/2012 1126   CO2 26 04/01/2013 1031   CO2 26 04/17/2012 1126   BUN 18.8 04/01/2013 1031   BUN 17 04/17/2012 1126  CREATININE 0.8 04/01/2013 1031   CREATININE 0.68 04/17/2012 1126      Component Value Date/Time   CALCIUM 9.6 04/01/2013 1031   CALCIUM 10.1 04/17/2012 1126   ALKPHOS 70 04/01/2013 1031   ALKPHOS 70 04/17/2012 1126   AST 18 04/01/2013 1031   AST 12 04/17/2012 1126   ALT 29 04/01/2013 1031   ALT 10 04/17/2012 1126   BILITOT 0.48 04/01/2013 1031   BILITOT 0.4 04/17/2012 1126       Lab Results  Component Value Date   LABCA2 43* 01/12/2012    Urinalysis No results found for this basename: colorurine,  appearanceur,  labspec,  phurine,  glucoseu,  hgbur,  bilirubinur,  ketonesur,  proteinur,  urobilinogen,  nitrite,  leukocytesur    STUDIES: No results found. She will be due for her next mammogram February 2015 and her next bone density may of 2015  ASSESSMENT: 67 y.o. [place], [place] woman:  1.  Status post left lumpectomy and left axillary sentinel node biopsy on 01/02/2012 for a pT1c pN0,stage IA invasive ductal carcinoma, grade 2,  ER 100%, PR 100%, Ki-67 6%, HER-2/neu negative for amplification  2.  Status post radiation therapy from 01/25/2012 through 03/09/2012.  3.  The patient started anastrozole in 03/2012.  4.   Osteopenia: the patient's last bone density scan on 03/14/2012 showed a T score of -1.8. Currently on Fosamax.  PLAN: Latroya is doing terrific on her current medications. We are going to see her  again in may, after her mammogram, and we will obtain a vitamin D level at that visit as well. Since she sees her primary care physician and in the fall, perhaps we could start once a year visits here assuming all continues well as at present  She knows to call for any problems that may develop before her next visit here 10/01/2013, 9:33 AM   Lowella Dell, MD

## 2013-10-03 ENCOUNTER — Other Ambulatory Visit: Payer: Self-pay | Admitting: *Deleted

## 2013-10-03 MED ORDER — ANASTROZOLE 1 MG PO TABS: 1.0000 mg | ORAL_TABLET | Freq: Every day | ORAL | Status: DC

## 2013-10-26 IMAGING — MG MM DIGITAL SCREENING BILAT
4 series · 4 of 4 positions shown · non-contrast
Comparison: none

DG SCREEN MAMMOGRAM BILATERAL
Bilateral CC and MLO view(s) were taken.
Technologist: Tiger, Yoel.(VISHWAS)(M)

DIGITAL SCREENING MAMMOGRAM WITH CAD:
The breast tissue is heterogeneously dense.  A possible mass is noted in the left breast.  Spot 
compression views and possibly sonography are recommended for further evaluation.  In the right 
breast, no masses or malignant type calcifications are identified.  Compared with prior studies.
Images were processed with CAD.

[R CC]
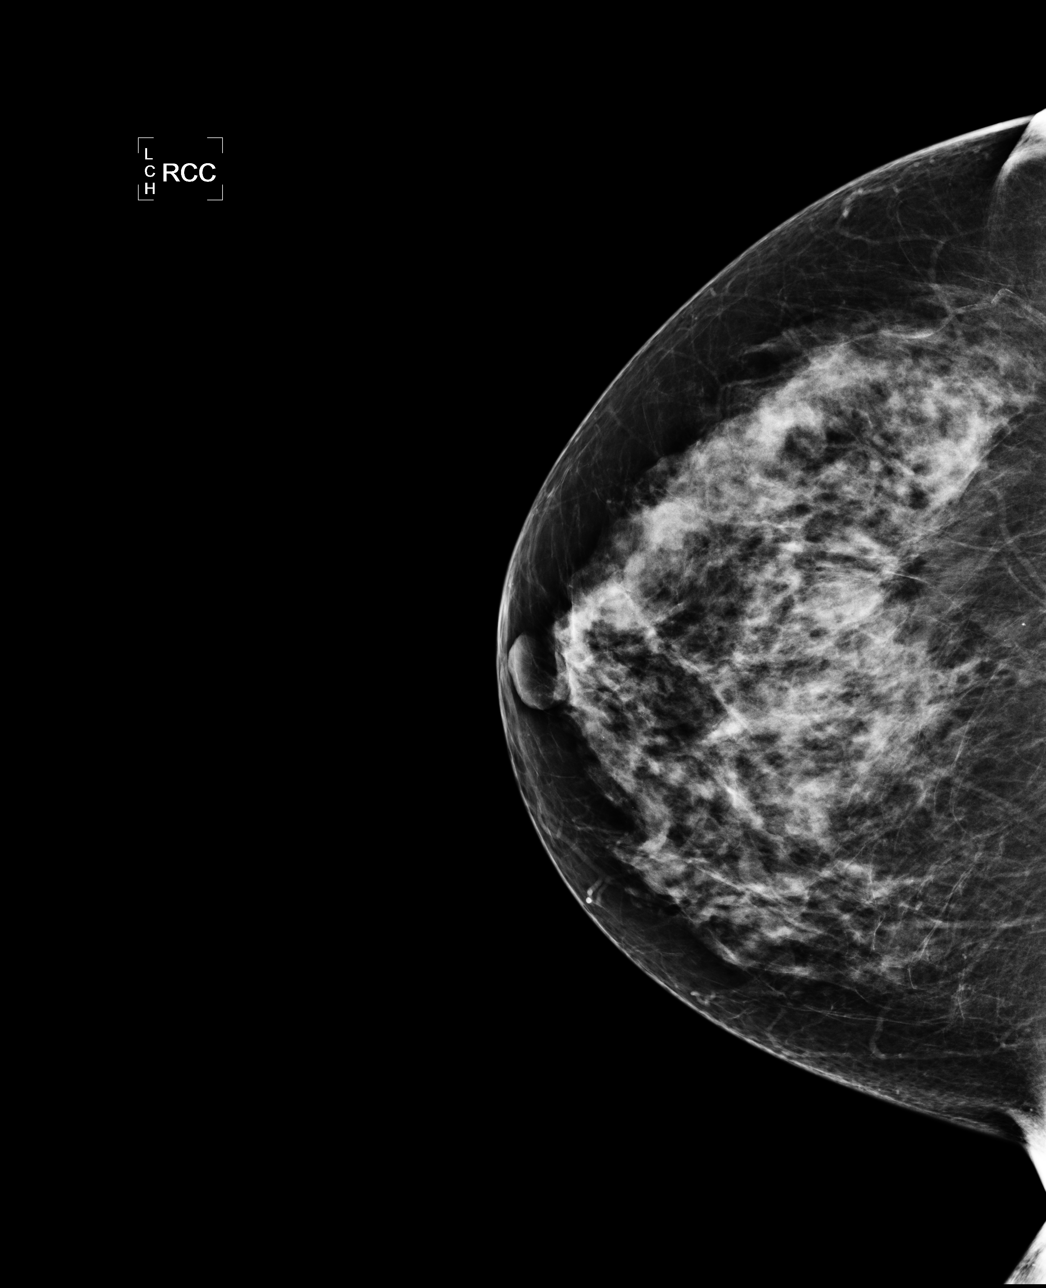

[R MLO]
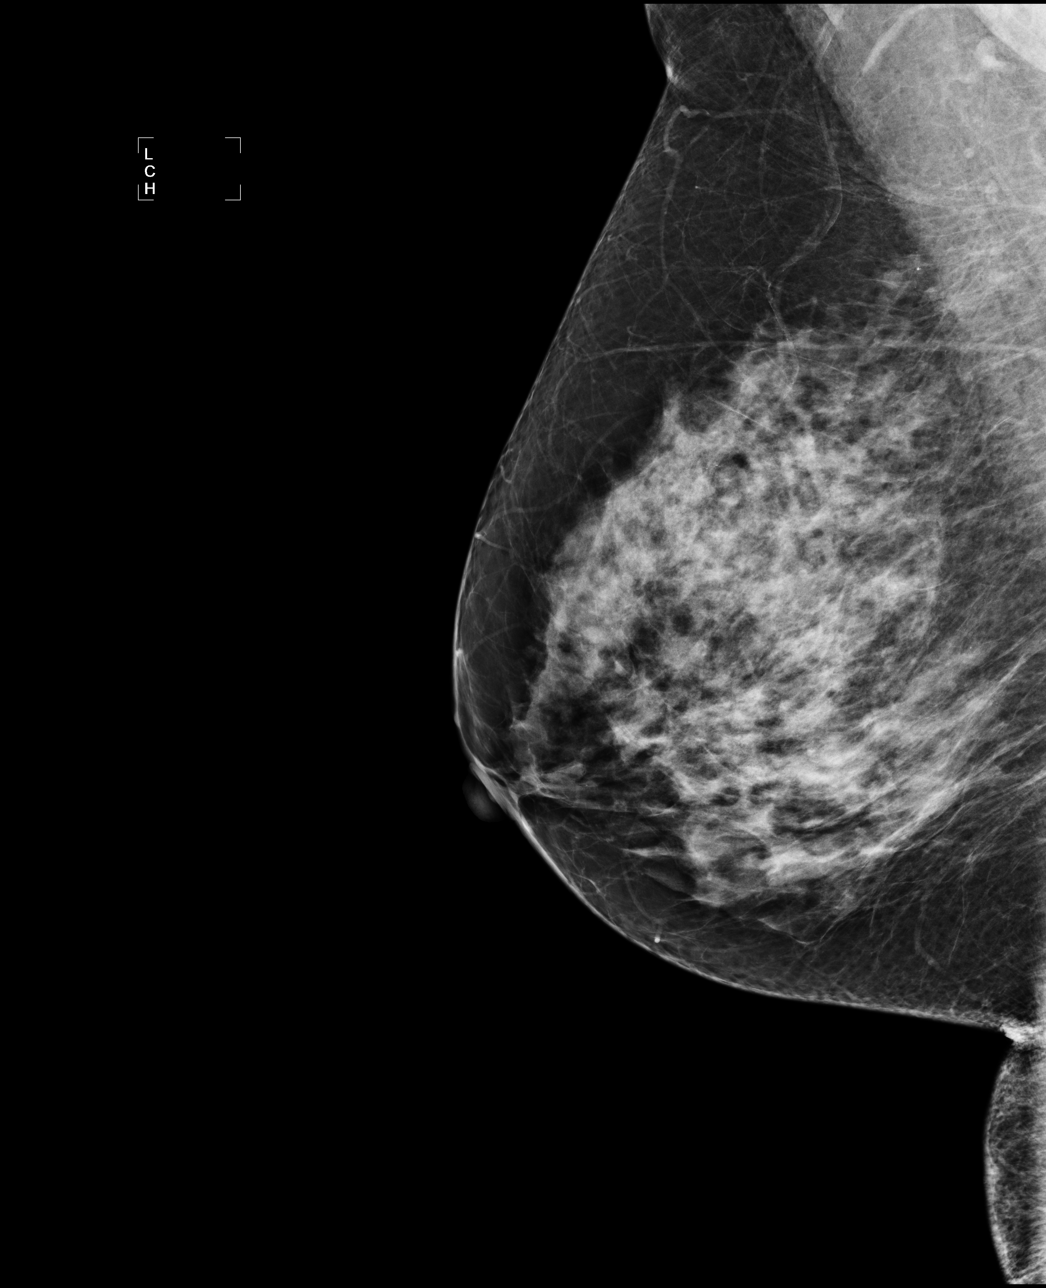

[L CC]
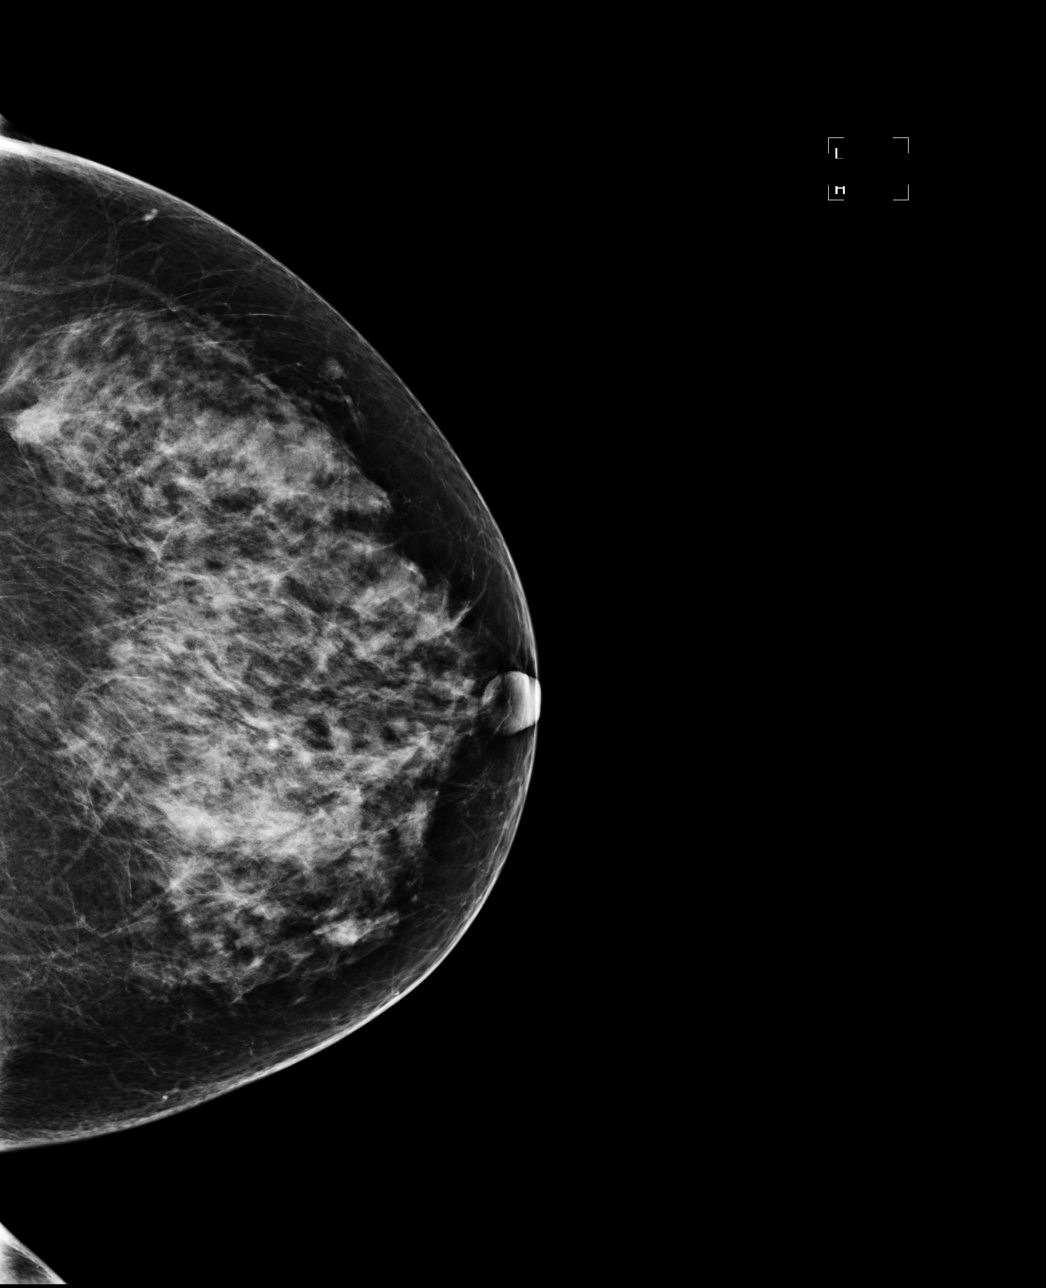

[L MLO]
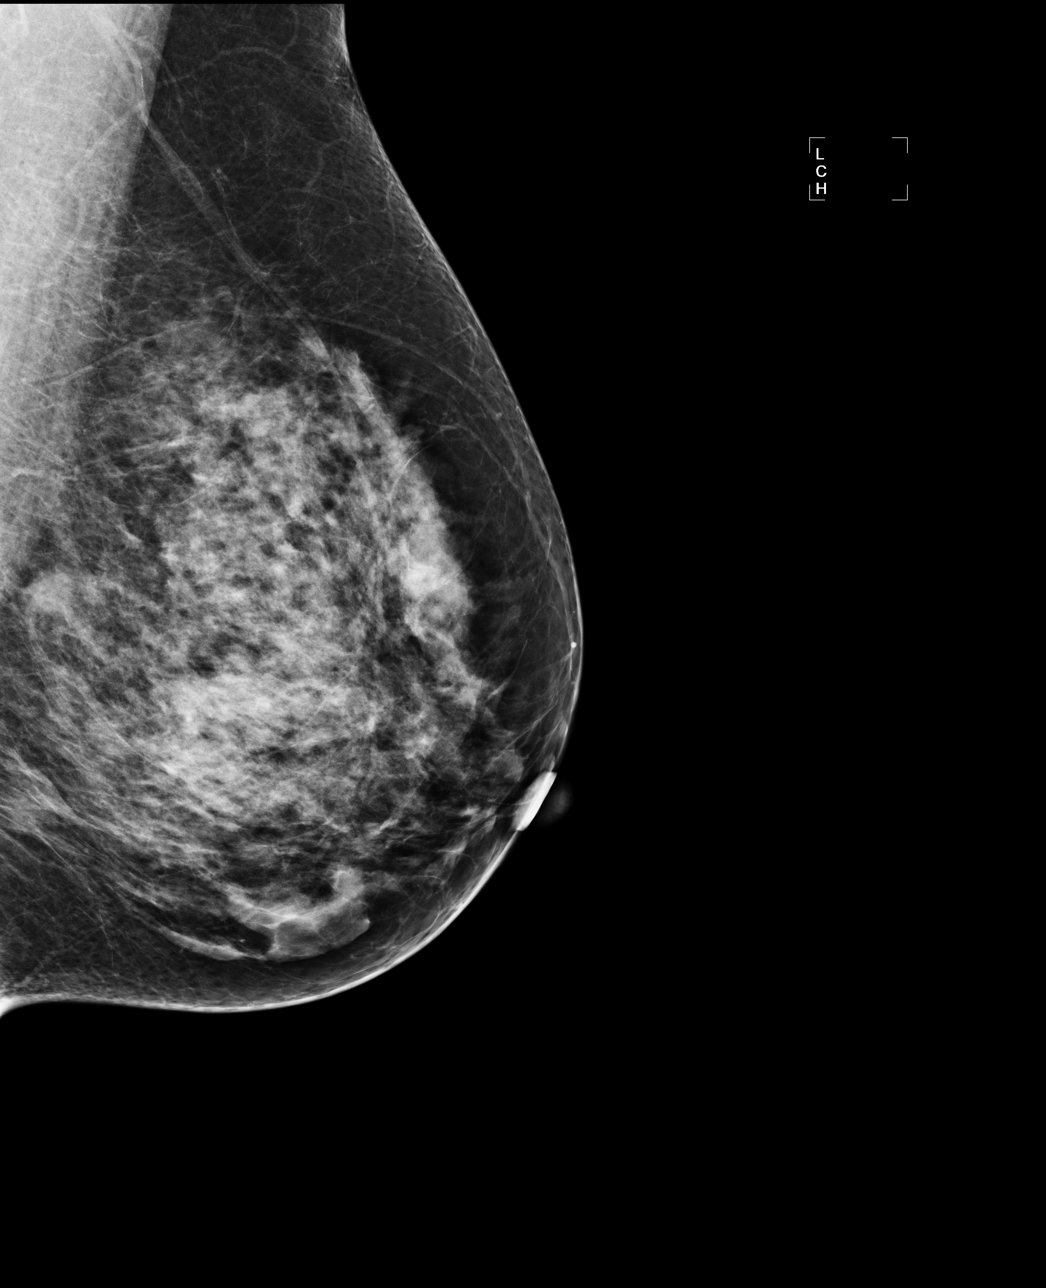

[4 of 4 positions shown; findings below may reference images not displayed]

IMPRESSION: Possible mass, left breast.  Additional evaluation is indicated.  The patient will be contacted for
additional studies and a supplementary report will follow.  No specific mammographic evidence of 
malignancy, right breast.

ASSESSMENT: Need additional imaging evaluation and/or prior mammograms for comparison - BI-RADS 0

Further imaging of the left breast.
,

## 2013-11-13 ENCOUNTER — Other Ambulatory Visit: Payer: Self-pay | Admitting: Oncology

## 2013-11-13 DIAGNOSIS — Z853 Personal history of malignant neoplasm of breast: Secondary | ICD-10-CM

## 2013-11-13 DIAGNOSIS — Z9889 Other specified postprocedural states: Secondary | ICD-10-CM

## 2013-12-02 ENCOUNTER — Other Ambulatory Visit: Payer: Self-pay | Admitting: *Deleted

## 2013-12-02 DIAGNOSIS — C50419 Malignant neoplasm of upper-outer quadrant of unspecified female breast: Secondary | ICD-10-CM

## 2013-12-02 MED ORDER — ALENDRONATE SODIUM 35 MG PO TABS: 35.0000 mg | ORAL_TABLET | ORAL | Status: DC

## 2013-12-11 ENCOUNTER — Ambulatory Visit
Admission: RE | Admit: 2013-12-11 | Discharge: 2013-12-11 | Disposition: A | Discharge status: Home / Self Care | Payer: Commercial Managed Care - HMO | Source: Ambulatory Visit | Attending: Oncology | Admitting: Oncology

## 2013-12-11 DIAGNOSIS — Z9889 Other specified postprocedural states: Secondary | ICD-10-CM

## 2013-12-11 DIAGNOSIS — Z853 Personal history of malignant neoplasm of breast: Secondary | ICD-10-CM

## 2014-01-06 ENCOUNTER — Other Ambulatory Visit: Payer: Self-pay | Admitting: *Deleted

## 2014-01-06 DIAGNOSIS — C50419 Malignant neoplasm of upper-outer quadrant of unspecified female breast: Secondary | ICD-10-CM

## 2014-01-06 MED ORDER — ANASTROZOLE 1 MG PO TABS: 1.0000 mg | ORAL_TABLET | Freq: Every day | ORAL | Status: DC

## 2014-04-09 ENCOUNTER — Other Ambulatory Visit: Payer: Self-pay | Admitting: Oncology

## 2014-04-09 DIAGNOSIS — C50419 Malignant neoplasm of upper-outer quadrant of unspecified female breast: Secondary | ICD-10-CM

## 2014-04-21 ENCOUNTER — Telehealth: Payer: Self-pay | Admitting: Physician Assistant

## 2014-04-21 ENCOUNTER — Other Ambulatory Visit (HOSPITAL_BASED_OUTPATIENT_CLINIC_OR_DEPARTMENT_OTHER): Payer: Commercial Managed Care - HMO

## 2014-04-21 ENCOUNTER — Ambulatory Visit (HOSPITAL_BASED_OUTPATIENT_CLINIC_OR_DEPARTMENT_OTHER): Payer: Commercial Managed Care - HMO | Admitting: Physician Assistant

## 2014-04-21 ENCOUNTER — Encounter: Payer: Self-pay | Admitting: Physician Assistant

## 2014-04-21 VITALS — BP 122/78 | HR 76 | Temp 98.4°F | Resp 20 | Ht 65.0 in | Wt 182.9 lb

## 2014-04-21 DIAGNOSIS — M949 Disorder of cartilage, unspecified: Secondary | ICD-10-CM

## 2014-04-21 DIAGNOSIS — Z853 Personal history of malignant neoplasm of breast: Secondary | ICD-10-CM

## 2014-04-21 DIAGNOSIS — M899 Disorder of bone, unspecified: Secondary | ICD-10-CM

## 2014-04-21 DIAGNOSIS — Z170 Estrogen receptor positive status [ER+]: Secondary | ICD-10-CM

## 2014-04-21 DIAGNOSIS — M8580 Other specified disorders of bone density and structure, unspecified site: Secondary | ICD-10-CM

## 2014-04-21 DIAGNOSIS — C50919 Malignant neoplasm of unspecified site of unspecified female breast: Secondary | ICD-10-CM

## 2014-04-21 DIAGNOSIS — C50412 Malignant neoplasm of upper-outer quadrant of left female breast: Secondary | ICD-10-CM

## 2014-04-21 DIAGNOSIS — Z780 Asymptomatic menopausal state: Secondary | ICD-10-CM | POA: Insufficient documentation

## 2014-04-21 LAB — COMPREHENSIVE METABOLIC PANEL (CC13)
ALBUMIN: 4.1 g/dL (ref 3.5–5.0)
ALT: 17 U/L (ref 0–55)
AST: 13 U/L (ref 5–34)
Alkaline Phosphatase: 79 U/L (ref 40–150)
Anion Gap: 9 mEq/L (ref 3–11)
BUN: 17.2 mg/dL (ref 7.0–26.0)
CO2: 25 mEq/L (ref 22–29)
Calcium: 10 mg/dL (ref 8.4–10.4)
Chloride: 106 mEq/L (ref 98–109)
Creatinine: 0.8 mg/dL (ref 0.6–1.1)
Glucose: 100 mg/dl (ref 70–140)
POTASSIUM: 4.3 meq/L (ref 3.5–5.1)
Sodium: 141 mEq/L (ref 136–145)
TOTAL PROTEIN: 7.4 g/dL (ref 6.4–8.3)
Total Bilirubin: 0.5 mg/dL (ref 0.20–1.20)

## 2014-04-21 LAB — CBC WITH DIFFERENTIAL/PLATELET
BASO%: 1.2 % (ref 0.0–2.0)
Basophils Absolute: 0.1 10*3/uL (ref 0.0–0.1)
EOS%: 5.7 % (ref 0.0–7.0)
Eosinophils Absolute: 0.4 10*3/uL (ref 0.0–0.5)
HCT: 44 % (ref 34.8–46.6)
HGB: 14.5 g/dL (ref 11.6–15.9)
LYMPH%: 22.9 % (ref 14.0–49.7)
MCH: 30.9 pg (ref 25.1–34.0)
MCHC: 33 g/dL (ref 31.5–36.0)
MCV: 93.8 fL (ref 79.5–101.0)
MONO#: 0.3 10*3/uL (ref 0.1–0.9)
MONO%: 5.3 % (ref 0.0–14.0)
NEUT%: 64.9 % (ref 38.4–76.8)
NEUTROS ABS: 4 10*3/uL (ref 1.5–6.5)
Platelets: 270 10*3/uL (ref 145–400)
RBC: 4.69 10*6/uL (ref 3.70–5.45)
RDW: 12.7 % (ref 11.2–14.5)
WBC: 6.2 10*3/uL (ref 3.9–10.3)
lymph#: 1.4 10*3/uL (ref 0.9–3.3)

## 2014-04-21 MED ORDER — ANASTROZOLE 1 MG PO TABS: 1.0000 mg | ORAL_TABLET | Freq: Every day | ORAL | Status: DC

## 2014-04-21 NOTE — Progress Notes (Signed)
[place]  Telephone:(336) [phone] Fax:(336) [phone]  OFFICE PROGRESS NOTE   ID: Krystal Payne   DOB: Oct 03, 1946  MR#: [id number]  XBM#:[id number]   PCP: Juluis Pitch, MD GYN:  SU: Rolm Bookbinder, M.D. RAD ONC: Rexene Edison, M.D.  CHIEF COMPLAINT:  Hx of Left Breast Cancer (anastrazole)    HISTORY OF PRESENT ILLNESS: From Dr. Collier Salina Rubin's new patient evaluation note dated 01/12/2012: "Krystal Payne 68 year old woman in excellent health who presents with an abnormal mammogram. Screening mammogram 12/01/2011 showed a possible mass in the left breast. Diagnostic mammogram and ultrasound performed on 12/15/2011 showed a subtle the palpable mass at 3:00 position. On ultrasound this measured 1.1 x 0.8 x 0.7 cm. A biopsy was performed on 12/15/2011 which showed invasive mammary carcinoma grade 1/2 ER +100% PR +100%, proliferative index 6% HER-2 was amplified with a ratio of 1.36 bilateral MRI exam on 12/21/2011 showed a solitary 11 x 9 x 9 mm mass is doing third of her left breast. No other abnormalities were seen in a suspicious lesion in the liver was likely a felt to be a cyst. The patient underwent a lumpectomy and sentinel lymph node evaluation on 01/02/2012. Final pathology showed a 1.1 cm grade 1/2 cancer with 1 and negative sentinel lymph node and clear surgical margins. She does have an unremarkable postoperative course. A. intraoperatively in the MammoSite catheter was placed subsequently this was removed and was realized that the distance to the skin was too close. She has been referred for external beam irradiation and it seems as for a medical oncology consultation."  Her subsequent history is as detailed below.   INTERVAL HISTORY: Krystal Payne returns alone today for followup of her left breast cancer. Physically, she is feeling well, but she has been under quite a bit of stress recently, taking care of her 79 year old brother who was recently diagnosed  with colon cancer, and has other medical issues as well.   Krystal Payne continues on anastrozole with good tolerance. She has no significant hot flashes. She has only slight joint pain, but does not attribute this to the aromatase inhibitor. She's had no increased vaginal dryness and denies vaginal bleeding. She has a history of osteopenia for which she continues on alendronate weekly with good tolerance.   REVIEW OF SYSTEMS: Krystal Payne denies any recent illnesses and has had no fevers, chills, or night sweats. She's had no rashes, skin changes, abnormal bruising, or abnormal bleeding. Her energy level is fair. Her appetite is good. She's had no nausea or emesis and denies any change in bowel or bladder habits. She is up-to-date with her on screening colonoscopies. She denies any increased cough, phlegm production, shortness of breath, peripheral swelling, chest pain, or palpitations. She's had no abnormal headaches, dizziness, or change in vision. Currently, she denies any new or unusual myalgias, arthralgias, or bony pain.  A detailed review of systems is otherwise stable and noncontributory.   PAST MEDICAL HISTORY: Past Medical History  Diagnosis Date  . Hyperlipidemia   . Breast cancer     left breast  . History of radiation therapy 01/25/12 - 03/09/12    left breast  . Urgency of urination   . Arm fracture, left 1969  . Basal cell carcinoma     By midsternal area    PAST SURGICAL HISTORY: Past Surgical History  Procedure Laterality Date  . Breast biopsy      left  . Breast lumpectomy      left lumpectomy, snbx  .  Vaginal hysterectomy  1985    FAMILY HISTORY Family History  Problem Relation Age of Onset  . Heart disease Mother   . Diabetes Mother   . Heart disease Sister   . Cancer Brother     colon//under treatment  . Cancer Maternal Aunt     breast/dx 16 years ago/currently 58  . Alzheimer's disease Father   . COPD Brother   . Heart disease Brother   . Heart disease  Brother   . Heart attack Brother   . Lung disease Brother   She is one of 12 children in her family; one sibling has died of myocardial infarction. She has another brother who was diagnosed with stage IIIC colon cancer. She is a strong family history of colonic polyps and her family including herself.    GYNECOLOGIC HISTORY:  (Reviewed 04/21/2014) G2  P2, menarche 66,  menopause at time of hysterectomy in 1985 and ovaries were retained.   SOCIAL HISTORY:  (Updated 04/21/2014) The patient has been married to [employer]" Theroux since 1970.  He is a retired Systems developer.  She retired from being a Administrator, arts in 2009.  The patient and her husband have 2 adult children, 1 son and 1 daughter that both live in [place], [place]. They have 2 grandchildren who as of 96 are 100 and 76 years old. The patient enjoys going to the beach, antiquing, listening to music and doing pro bono patient advocate activities in her spare time.  She also participates in [employer] at the [facility] 3-4 times weekly.  ADVANCED DIRECTIVES:  The patient states she has a living will.  HEALTH MAINTENANCE:  (Updated 04/21/2014) History  Substance Use Topics  . Smoking status: Former Smoker -- 0.25 packs/day for 2 years    Types: Cigarettes    Quit date: 10/31/1968  . Smokeless tobacco: Never Used  . Alcohol Use: Yes     Comment: occasional beer or wine    Colonoscopy:  UTD PAP: UTD Bone density:  The patient's last bone density scan at [facility] on 03/14/2012 showed a T score of -1.8 (osteopenia). Lipid panel: Not on file   No Known Allergies  Current Outpatient Prescriptions  Medication Sig Dispense Refill  . alendronate (FOSAMAX) 35 MG tablet Take 1 tablet (35 mg total) by mouth every 7 (seven) days. Take with a full glass of water on an empty stomach.  12 tablet  1  . anastrozole (ARIMIDEX) 1 MG tablet Take 1 tablet (1 mg total) by mouth daily.  90 tablet  3  . cholecalciferol  (VITAMIN D) 1000 UNITS tablet Take 2,000 Units by mouth daily.      Marland Kitchen oxybutynin (DITROPAN) 5 MG tablet Take 5 mg by mouth daily.       . simvastatin (ZOCOR) 20 MG tablet Take 20 mg by mouth every evening.       No current facility-administered medications for this visit.    OBJECTIVE: Middle-aged white woman who appears well and is in no acute distress Filed Vitals:   04/21/14 1056  BP: 122/78  Pulse: 76  Temp: 98.4 F (36.9 C)  Resp: 20     Body mass index is 30.44 kg/(m^2).    ECOG:  0 Filed Weights   04/21/14 1056  Weight: 182 lb 14.4 oz (82.963 kg)   Physical Exam: HEENT:  Sclerae anicteric.  Oropharynx clear. Buccal mucosa is pink and moist. No mucositis or candidiasis. Next couple, trachea midline. No thyromegaly palpated. NODES:  No cervical or supraclavicular lymphadenopathy palpated.  BREAST EXAM: Right breast is unremarkable. Left breast is status post lumpectomy. No suspicious nodularities or skin changes noted on exam. No evidence of local recurrence. Axillae are benign bilaterally, no palpable lymphadenopathy. LUNGS:  Clear to auscultation bilaterally.  No wheezes or rhonchi HEART:  Regular rate and rhythm. No murmur appreciated. ABDOMEN:  Soft, nontender.  Positive bowel sounds.  MSK:  No focal spinal tenderness to palpation. Good range of motion bilaterally in the upper extremities. EXTREMITIES:  No peripheral edema.  No lymphedema in the left upper extremity. SKIN:  No visible rashes. No excessive ecchymoses. No petechiae. No pallor. Good skin turgor. NEURO:  Nonfocal. Well oriented.  Appropriate affect.   LAB RESULTS: Lab Results  Component Value Date   WBC 6.2 04/21/2014   NEUTROABS 4.0 04/21/2014   HGB 14.5 04/21/2014   HCT 44.0 04/21/2014   MCV 93.8 04/21/2014   PLT 270 04/21/2014      Chemistry      Component Value Date/Time   NA 144 10/01/2013 0917   NA 141 04/17/2012 1126   K 4.3 10/01/2013 0917   K 4.9 04/17/2012 1126   CL 106 04/01/2013 1031   CL 105  04/17/2012 1126   CO2 24 10/01/2013 0917   CO2 26 04/17/2012 1126   BUN 14.4 10/01/2013 0917   BUN 17 04/17/2012 1126   CREATININE 0.8 10/01/2013 0917   CREATININE 0.68 04/17/2012 1126      Component Value Date/Time   CALCIUM 9.7 10/01/2013 0917   CALCIUM 10.1 04/17/2012 1126   ALKPHOS 81 10/01/2013 0917   ALKPHOS 70 04/17/2012 1126   AST 13 10/01/2013 0917   AST 12 04/17/2012 1126   ALT 13 10/01/2013 0917   ALT 10 04/17/2012 1126   BILITOT 0.33 10/01/2013 0917   BILITOT 0.4 04/17/2012 1126      STUDIES:  Most recent bilateral mammogram on 12/11/2013 at the [facility] was unremarkable.  Most recent bone density at [facility] in [place] on 03/14/2012 showed osteopenia with a T score of -1.8. That is due to be repeated this summer.   ASSESSMENT: 68 y.o. [place], [place] woman:  1.  Status post left lumpectomy and left axillary sentinel node biopsy on 01/02/2012 for a pT1c pN0,stage IA invasive ductal carcinoma, grade 2,  ER 100%, PR 100%, Ki-67 6%, HER-2/neu negative for amplification  2.  Status post radiation therapy from 01/25/2012 through 03/09/2012.  3.  The patient started anastrozole in 03/2012, the plan being to continue for a total of 5 years (until June 2018).  4.   Osteopenia: the patient's last bone density scan on 03/14/2012 showed a T score of -1.8. Currently on Fosamax.  PLAN: Krystal Payne appears to be doing well with regards to her breast cancer, with no clinical evidence of disease recurrence. She is tolerating the anastrozole well, and that has been refilled for another year. Of course she will continue on the alendronate weekly for a history of osteopenia, and is due for her next bone density this month at [facility]. That is being scheduled for her.  Otherwise, Krystal Payne will continue to see her primary care physician in the fall or early winter of each year, and we will continue seeing her each summer June for repeat labs and physical exam. She will  have her next annual mammogram in February, and will return to see [place] in June 2016.  All of the above was reviewed in detail Krystal Payne is a. She  voiced her understanding and agreement with this plan, and she knows to call with any changes or problems prior to her next appointment.  Krystal Flesher, PA-C 04/21/2014, 11:36 AM

## 2014-04-21 NOTE — Telephone Encounter (Signed)
per pof to sch pt appt-mamma-BD-sch all gave pt copy of sch time & dates & location

## 2014-04-22 LAB — VITAMIN D 25 HYDROXY (VIT D DEFICIENCY, FRACTURES): Vit D, 25-Hydroxy: 63 ng/mL (ref 30–89)

## 2014-05-13 ENCOUNTER — Other Ambulatory Visit: Payer: Self-pay | Admitting: *Deleted

## 2014-05-15 ENCOUNTER — Ambulatory Visit: Payer: Self-pay

## 2014-05-15 ENCOUNTER — Telehealth: Payer: Self-pay

## 2014-05-15 NOTE — Telephone Encounter (Signed)
Fax received from [facility] dtd 05/15/14 Dr Thornton Papas - bone density scan.  Copy to GM.  Original to scan.

## 2014-06-10 ENCOUNTER — Other Ambulatory Visit: Payer: Self-pay | Admitting: Oncology

## 2014-06-11 ENCOUNTER — Other Ambulatory Visit: Payer: Self-pay | Admitting: Oncology

## 2014-06-21 ENCOUNTER — Encounter: Payer: Self-pay | Admitting: Oncology

## 2014-06-23 NOTE — Telephone Encounter (Signed)
[place].  Spoke with Louie Casa who confirmed patient has refills on file from June 2015.  Instructions received for patient to call [phone] to request refills.  Called patient and gave her this information and she wrote down the customer service number.  Reports she has tried and will call again to obtain her refill.

## 2014-08-02 ENCOUNTER — Other Ambulatory Visit: Payer: Self-pay | Admitting: Oncology

## 2014-08-02 DIAGNOSIS — C50412 Malignant neoplasm of upper-outer quadrant of left female breast: Secondary | ICD-10-CM

## 2014-11-25 DIAGNOSIS — Z8639 Personal history of other endocrine, nutritional and metabolic disease: Secondary | ICD-10-CM | POA: Insufficient documentation

## 2014-12-12 ENCOUNTER — Ambulatory Visit
Admission: RE | Admit: 2014-12-12 | Discharge: 2014-12-12 | Disposition: A | Discharge status: Home / Self Care | Payer: Commercial Managed Care - HMO | Source: Ambulatory Visit | Attending: Physician Assistant | Admitting: Physician Assistant

## 2014-12-12 DIAGNOSIS — Z853 Personal history of malignant neoplasm of breast: Secondary | ICD-10-CM

## 2015-04-20 ENCOUNTER — Other Ambulatory Visit: Payer: Self-pay | Admitting: *Deleted

## 2015-04-20 DIAGNOSIS — C50412 Malignant neoplasm of upper-outer quadrant of left female breast: Secondary | ICD-10-CM

## 2015-04-21 ENCOUNTER — Other Ambulatory Visit (HOSPITAL_BASED_OUTPATIENT_CLINIC_OR_DEPARTMENT_OTHER): Payer: Commercial Managed Care - HMO

## 2015-04-21 DIAGNOSIS — C50819 Malignant neoplasm of overlapping sites of unspecified female breast: Secondary | ICD-10-CM | POA: Diagnosis not present

## 2015-04-21 DIAGNOSIS — C50412 Malignant neoplasm of upper-outer quadrant of left female breast: Secondary | ICD-10-CM

## 2015-04-21 LAB — CBC WITH DIFFERENTIAL/PLATELET
BASO%: 0.5 % (ref 0.0–2.0)
Basophils Absolute: 0 10*3/uL (ref 0.0–0.1)
EOS ABS: 0.2 10*3/uL (ref 0.0–0.5)
EOS%: 3.5 % (ref 0.0–7.0)
HCT: 40.2 % (ref 34.8–46.6)
HGB: 13.7 g/dL (ref 11.6–15.9)
LYMPH%: 28.2 % (ref 14.0–49.7)
MCH: 31.4 pg (ref 25.1–34.0)
MCHC: 34.1 g/dL (ref 31.5–36.0)
MCV: 92 fL (ref 79.5–101.0)
MONO#: 0.5 10*3/uL (ref 0.1–0.9)
MONO%: 8.3 % (ref 0.0–14.0)
NEUT%: 59.5 % (ref 38.4–76.8)
NEUTROS ABS: 3.6 10*3/uL (ref 1.5–6.5)
Platelets: 236 10*3/uL (ref 145–400)
RBC: 4.37 10*6/uL (ref 3.70–5.45)
RDW: 12.6 % (ref 11.2–14.5)
WBC: 6 10*3/uL (ref 3.9–10.3)
lymph#: 1.7 10*3/uL (ref 0.9–3.3)

## 2015-04-21 LAB — COMPREHENSIVE METABOLIC PANEL (CC13)
ALK PHOS: 80 U/L (ref 40–150)
ALT: 25 U/L (ref 0–55)
AST: 16 U/L (ref 5–34)
Albumin: 3.9 g/dL (ref 3.5–5.0)
Anion Gap: 7 mEq/L (ref 3–11)
BILIRUBIN TOTAL: 0.32 mg/dL (ref 0.20–1.20)
BUN: 22.3 mg/dL (ref 7.0–26.0)
CO2: 28 mEq/L (ref 22–29)
Calcium: 9.5 mg/dL (ref 8.4–10.4)
Chloride: 108 mEq/L (ref 98–109)
Creatinine: 0.7 mg/dL (ref 0.6–1.1)
EGFR: 85 mL/min/{1.73_m2} — ABNORMAL LOW (ref >90)
GLUCOSE: 89 mg/dL (ref 70–140)
Potassium: 4 mEq/L (ref 3.5–5.1)
Sodium: 143 mEq/L (ref 136–145)
TOTAL PROTEIN: 6.9 g/dL (ref 6.4–8.3)

## 2015-04-28 ENCOUNTER — Ambulatory Visit (HOSPITAL_BASED_OUTPATIENT_CLINIC_OR_DEPARTMENT_OTHER): Payer: Commercial Managed Care - HMO | Admitting: Oncology

## 2015-04-28 ENCOUNTER — Ambulatory Visit: Payer: Medicare HMO | Admitting: Physician Assistant

## 2015-04-28 VITALS — BP 116/74 | HR 79 | Temp 98.4°F | Resp 18 | Ht 65.0 in | Wt 186.2 lb

## 2015-04-28 DIAGNOSIS — M8580 Other specified disorders of bone density and structure, unspecified site: Secondary | ICD-10-CM | POA: Diagnosis not present

## 2015-04-28 DIAGNOSIS — Z170 Estrogen receptor positive status [ER+]: Secondary | ICD-10-CM

## 2015-04-28 DIAGNOSIS — Z79811 Long term (current) use of aromatase inhibitors: Secondary | ICD-10-CM | POA: Diagnosis not present

## 2015-04-28 DIAGNOSIS — C50812 Malignant neoplasm of overlapping sites of left female breast: Secondary | ICD-10-CM | POA: Diagnosis not present

## 2015-04-28 DIAGNOSIS — C50412 Malignant neoplasm of upper-outer quadrant of left female breast: Secondary | ICD-10-CM

## 2015-04-28 MED ORDER — ANASTROZOLE 1 MG PO TABS: 1.0000 mg | ORAL_TABLET | Freq: Every day | ORAL | Status: DC

## 2015-04-28 MED ORDER — ALENDRONATE SODIUM 35 MG PO TABS: ORAL_TABLET | ORAL | Status: DC

## 2015-04-28 NOTE — Progress Notes (Signed)
[facility]  [phone] [phone]) [phone]  OFFICE PROGRESS NOTE   ID: Krystal Payne   DOB: 1946/06/27  MR#: [id number]  MPN#:[id number]   PCP: Juluis Pitch, MD GYN:  SU: Rolm Bookbinder, M.D. RAD ONC: Rexene Edison, M.D.  CHIEF COMPLAINT:  Hx of Left Breast Cancer   CURRENT TREATMENT: Anastrozole    HISTORY OF PRESENT ILLNESS: From Dr. Collier Salina Rubin's new patient evaluation note dated 01/12/2012:  "Delightful 69 year old woman in excellent health who presents with an abnormal mammogram. Screening mammogram 12/01/2011 showed a possible mass in the left breast. Diagnostic mammogram and ultrasound performed on 12/15/2011 showed a subtle the palpable mass at 3:00 position. On ultrasound this measured 1.1 x 0.8 x 0.7 cm. A biopsy was performed on 12/15/2011 which showed invasive mammary carcinoma grade 1/2 ER +100% PR +100%, proliferative index 6% HER-2 was amplified with a ratio of 1.36 bilateral MRI exam on 12/21/2011 showed a solitary 11 x 9 x 9 mm mass is doing third of her left breast. No other abnormalities were seen in a suspicious lesion in the liver was likely a felt to be a cyst. The patient underwent a lumpectomy and sentinel lymph node evaluation on 01/02/2012. Final pathology showed a 1.1 cm grade 1/2 cancer with 1 and negative sentinel lymph node and clear surgical margins. She does have an unremarkable postoperative course. A. intraoperatively in the MammoSite catheter was placed subsequently this was removed and was realized that the distance to the skin was too close. She has been referred for external beam irradiation and it seems as for a medical oncology consultation."  Her subsequent history is as detailed below.   INTERVAL HISTORY: Krystal Payne returns alone today for followup of her left breast cancer.she had spent a great deal of time in energy caring for her disabled brother, who died January 30, 2015. Shortly thereafter her daughter  had her first child, and Rini became the boy his caregiver. She is crazy about him but on the other hand she does not want to commit date keeping him until he is radiation to go to day care. She is working those issues out with her daughter.--From a medical point of view she is tolerating anastrozole with no side effects that she is aware of and particularly no problems with hot flashes, vaginal dryness, or arthralgias or myalgias. She obtains a drug at no cost. She has been on Fosamax now for about a year. She also obtains it and no cost. She takes inappropriately on an empty stomach, vertical, with a glass of water. She just had a repeat bone density which shows improvement  REVIEW OF SYSTEMS: She has some stress urinary incontinence which is not a new problem. A detailed review of systems today was otherwise stable   PAST MEDICAL HISTORY: Past Medical History  Diagnosis Date  . Hyperlipidemia   . Breast cancer     left breast  . History of radiation therapy 01/25/12 - 03/09/12    left breast  . Urgency of urination   . Arm fracture, left 1969  . Basal cell carcinoma     By midsternal area    PAST SURGICAL HISTORY: Past Surgical History  Procedure Laterality Date  . Breast biopsy      left  . Breast lumpectomy      left lumpectomy, snbx  . Vaginal hysterectomy  1985    FAMILY HISTORY Family History  Problem Relation Age of Onset  . Heart disease Mother   .  Diabetes Mother   . Heart disease Sister   . Cancer Brother     colon//under treatment  . Cancer Maternal Aunt     breast/dx 16 years ago/currently 24  . Alzheimer's disease Father   . COPD Brother   . Heart disease Brother   . Heart disease Brother   . Heart attack Brother   . Lung disease Brother   She is one of 12 children in her family; one sibling has died of myocardial infarction. She has another brother who was diagnosed with stage IIIC colon cancer. She is a strong family history of colonic polyps and her  family including herself.    GYNECOLOGIC HISTORY:  (Reviewed 04/21/2014) G2  P2, menarche 82,  menopause at time of hysterectomy in 1985 and ovaries were retained.   SOCIAL HISTORY:  (Updated 04/21/2014) The patient has been married to [employer]" Celmer since 1970.  He is a retired Systems developer.  She retired from being a Administrator, arts in 2009.  The patient and her husband have 2 adult children, 1 son and 1 daughter that both live in [place], [place]. They have 2 grandchildren who as of 9 are 71 and 8 years old. The patient enjoys going to the beach, antiquing, listening to music and doing pro bono patient advocate activities in her spare time.  She also participates in [employer] at the [facility] 3-4 times weekly.  ADVANCED DIRECTIVES:  The patient states she has a living will.  HEALTH MAINTENANCE:  (Updated 04/21/2014) History  Substance Use Topics  . Smoking status: Former Smoker -- 0.25 packs/day for 2 years    Types: Cigarettes    Quit date: 10/31/1968  . Smokeless tobacco: Never Used  . Alcohol Use: Yes     Comment: occasional beer or wine    Colonoscopy:  UTD PAP: UTD Bone density:  The patient's last bone density scan at [facility] on 03/14/2012 showed a T score of -1.8 (osteopenia). Lipid panel: Not on file   No Known Allergies  Current Outpatient Prescriptions  Medication Sig Dispense Refill  . alendronate (FOSAMAX) 35 MG tablet TAKE 1 TABLET EVERY 7 DAYS WITH FULL GLASS OF WATER ON AN EMPTY STOMACH 12 tablet 2  . anastrozole (ARIMIDEX) 1 MG tablet Take 1 tablet (1 mg total) by mouth daily. 90 tablet 3  . cholecalciferol (VITAMIN D) 1000 UNITS tablet Take 2,000 Units by mouth daily.    Marland Kitchen oxybutynin (DITROPAN) 5 MG tablet Take 5 mg by mouth daily.     . simvastatin (ZOCOR) 20 MG tablet Take 20 mg by mouth every evening.     No current facility-administered medications for this visit.    OBJECTIVE: Middle-aged white woman who appears  stated age  69 Vitals:   04/28/15 1412  BP: 116/74  Pulse: 79  Temp: 98.4 F (36.9 C)  Resp: 18     Body mass index is 30.99 kg/(m^2).    ECOG:  0 Filed Weights   04/28/15 1412  Weight: 186 lb 3.2 oz (84.46 kg)   Sclerae unicteric, pupils round and equal Oropharynx clear and moist-- no thrush or other lesions No cervical or supraclavicular adenopathy Lungs no rales or rhonchi Heart regular rate and rhythm Abd soft, nontender, positive bowel sounds MSK no focal spinal tenderness, no upper extremity lymphedema Neuro: nonfocal, well oriented, appropriate affect Breasts: right breast is unremarkable.The left breast is status post lumpectomy and radiation. There is no evidence of local recurrence.  LAB RESULTS: Lab  Results  Component Value Date   WBC 6.0 04/21/2015   NEUTROABS 3.6 04/21/2015   HGB 13.7 04/21/2015   HCT 40.2 04/21/2015   MCV 92.0 04/21/2015   PLT 236 04/21/2015      Chemistry      Component Value Date/Time   NA 143 04/21/2015 1317   NA 141 04/17/2012 1126   K 4.0 04/21/2015 1317   K 4.9 04/17/2012 1126   CL 106 04/01/2013 1031   CL 105 04/17/2012 1126   CO2 28 04/21/2015 1317   CO2 26 04/17/2012 1126   BUN 22.3 04/21/2015 1317   BUN 17 04/17/2012 1126   CREATININE 0.7 04/21/2015 1317   CREATININE 0.68 04/17/2012 1126      Component Value Date/Time   CALCIUM 9.5 04/21/2015 1317   CALCIUM 10.1 04/17/2012 1126   ALKPHOS 80 04/21/2015 1317   ALKPHOS 70 04/17/2012 1126   AST 16 04/21/2015 1317   AST 12 04/17/2012 1126   ALT 25 04/21/2015 1317   ALT 10 04/17/2012 1126   BILITOT 0.32 04/21/2015 1317   BILITOT 0.4 04/17/2012 1126      STUDIES: Bone density at [facility] 05/15/2014 showed a T score of -1.1.  CLINICAL DATA: Annual diagnostic mammography. Patient underwent breast lumpectomy for carcinoma in March 2013. No current complaints.  EXAM: DIGITAL DIAGNOSTIC BILATERAL MAMMOGRAM WITH 3D TOMOSYNTHESIS AND CAD  COMPARISON:  Prior exams  ACR Breast Density Category c: The breast tissue is heterogeneously dense, which may obscure small masses.  FINDINGS: Postoperative architectural distortion in the upper outer left breast with associated vascular clips is stable. There are no discrete masses or other areas of distortion. There are no suspicious calcifications.  Mammographic images were processed with CAD.  IMPRESSION: No evidence of recurrent or new breast malignancy. Benign postoperative changes on the left.  RECOMMENDATION: Diagnostic mammography in 1 year per standard post lumpectomy protocol.  I have discussed the findings and recommendations with the patient. Results were also provided in writing at the conclusion of the visit. If applicable, a reminder letter will be sent to the patient regarding the next appointment.  BI-RADS CATEGORY 2: Benign.   Electronically Signed  By: Lajean Manes M.D.  On: 12/12/2014 10:17  ASSESSMENT: 69 y.o. [place], [place] woman:  1.  Status post left lumpectomy and left axillary sentinel node biopsy on 01/02/2012 for a pT1c pN0,stage IA invasive ductal carcinoma, grade 2,  ER 100%, PR 100%, Ki-67 6%, HER-2/neu negative for amplification  2.  Status post radiation therapy from 01/25/2012 through 03/09/2012.  3.  The patient started anastrozole in 03/2012, the plan being to continue for a total of 5 years (until June 2018).  4.   Osteopenia: the patient's bone density scan on 03/14/2012 showed a T score of -1.8.  (a) on Fosamax.  (b) repeat bone density July 2015 showed a T score of -1.1 (improved)  PLAN: Kynesha is tolerating the anastrozole well. She gets it at no cost. We discussed the new data that suggests that 10 years of aromatase inhibitors is superior to 5 years. The differences small but measurable. It bounced about 3% if her and send recurrence. Since she is also tolerating the Fosamax well, this may be something she will  want to consider.  I suggested she go back to [employer]. I also offered her referral to a urologist to discuss the stress urinary incontinence further. She wants to wait on that.  We discussed our new survivorship program. She will see our  survivorship nurse practitioner a year from now and she will see me again in 2 years. At that time we will discuss what she wants to do regarding treatment and further follow-up  She knows to call for any problems that may develop before next visit here. Chauncey Cruel, MD   04/28/2015, 2:21 PM

## 2015-09-01 ENCOUNTER — Other Ambulatory Visit: Payer: Self-pay | Admitting: Oncology

## 2015-11-19 ENCOUNTER — Other Ambulatory Visit: Payer: Self-pay | Admitting: Oncology

## 2015-11-19 DIAGNOSIS — Z9889 Other specified postprocedural states: Secondary | ICD-10-CM

## 2015-12-14 ENCOUNTER — Ambulatory Visit
Admission: RE | Admit: 2015-12-14 | Discharge: 2015-12-14 | Disposition: A | Discharge status: Home / Self Care | Payer: Commercial Managed Care - HMO | Source: Ambulatory Visit | Attending: Oncology | Admitting: Oncology

## 2015-12-14 DIAGNOSIS — Z9889 Other specified postprocedural states: Secondary | ICD-10-CM

## 2016-04-27 ENCOUNTER — Other Ambulatory Visit: Payer: Self-pay | Admitting: Nurse Practitioner

## 2016-04-27 ENCOUNTER — Ambulatory Visit (HOSPITAL_BASED_OUTPATIENT_CLINIC_OR_DEPARTMENT_OTHER): Payer: Commercial Managed Care - HMO | Admitting: Nurse Practitioner

## 2016-04-27 ENCOUNTER — Ambulatory Visit: Payer: Commercial Managed Care - HMO

## 2016-04-27 ENCOUNTER — Encounter: Payer: Self-pay | Admitting: Nurse Practitioner

## 2016-04-27 ENCOUNTER — Other Ambulatory Visit (HOSPITAL_BASED_OUTPATIENT_CLINIC_OR_DEPARTMENT_OTHER): Payer: Commercial Managed Care - HMO

## 2016-04-27 ENCOUNTER — Telehealth: Payer: Self-pay | Admitting: Nurse Practitioner

## 2016-04-27 VITALS — BP 109/68 | HR 79 | Temp 98.3°F | Resp 18 | Ht 65.0 in | Wt 185.2 lb

## 2016-04-27 DIAGNOSIS — C50412 Malignant neoplasm of upper-outer quadrant of left female breast: Secondary | ICD-10-CM

## 2016-04-27 DIAGNOSIS — Z205 Contact with and (suspected) exposure to viral hepatitis: Secondary | ICD-10-CM

## 2016-04-27 DIAGNOSIS — Z79811 Long term (current) use of aromatase inhibitors: Secondary | ICD-10-CM | POA: Diagnosis not present

## 2016-04-27 DIAGNOSIS — Z170 Estrogen receptor positive status [ER+]: Secondary | ICD-10-CM

## 2016-04-27 DIAGNOSIS — M8580 Other specified disorders of bone density and structure, unspecified site: Secondary | ICD-10-CM | POA: Diagnosis not present

## 2016-04-27 DIAGNOSIS — M810 Age-related osteoporosis without current pathological fracture: Secondary | ICD-10-CM

## 2016-04-27 LAB — COMPREHENSIVE METABOLIC PANEL
ALBUMIN: 3.7 g/dL (ref 3.5–5.0)
ALK PHOS: 86 U/L (ref 40–150)
ALT: 15 U/L (ref 0–55)
AST: 12 U/L (ref 5–34)
Anion Gap: 10 mEq/L (ref 3–11)
BILIRUBIN TOTAL: 0.3 mg/dL (ref 0.20–1.20)
BUN: 16.9 mg/dL (ref 7.0–26.0)
CO2: 24 meq/L (ref 22–29)
CREATININE: 0.8 mg/dL (ref 0.6–1.1)
Calcium: 9.7 mg/dL (ref 8.4–10.4)
Chloride: 108 mEq/L (ref 98–109)
EGFR: 81 mL/min/{1.73_m2} — ABNORMAL LOW (ref >90)
GLUCOSE: 78 mg/dL (ref 70–140)
Potassium: 4.2 mEq/L (ref 3.5–5.1)
SODIUM: 142 meq/L (ref 136–145)
TOTAL PROTEIN: 7.2 g/dL (ref 6.4–8.3)

## 2016-04-27 LAB — CBC WITH DIFFERENTIAL/PLATELET
BASO%: 0.8 % (ref 0.0–2.0)
Basophils Absolute: 0 10*3/uL (ref 0.0–0.1)
EOS ABS: 0.2 10*3/uL (ref 0.0–0.5)
EOS%: 3.6 % (ref 0.0–7.0)
HCT: 41 % (ref 34.8–46.6)
HEMOGLOBIN: 13.7 g/dL (ref 11.6–15.9)
LYMPH%: 29 % (ref 14.0–49.7)
MCH: 30.9 pg (ref 25.1–34.0)
MCHC: 33.4 g/dL (ref 31.5–36.0)
MCV: 92.3 fL (ref 79.5–101.0)
MONO#: 0.3 10*3/uL (ref 0.1–0.9)
MONO%: 6.3 % (ref 0.0–14.0)
NEUT%: 60.3 % (ref 38.4–76.8)
NEUTROS ABS: 3.2 10*3/uL (ref 1.5–6.5)
Platelets: 250 10*3/uL (ref 145–400)
RBC: 4.44 10*6/uL (ref 3.70–5.45)
RDW: 12.9 % (ref 11.2–14.5)
WBC: 5.3 10*3/uL (ref 3.9–10.3)
lymph#: 1.5 10*3/uL (ref 0.9–3.3)

## 2016-04-27 NOTE — Telephone Encounter (Signed)
appt made and avs printed. Pt sent back to lab. Pt will call to set up bone scan in [place]

## 2016-04-27 NOTE — Progress Notes (Signed)
CLINIC:  Cancer Survivorship   REASON FOR VISIT:  Routine follow-up post-treatment for history of breast cancer.  BRIEF ONCOLOGIC HISTORY:  Oncology History   ASSESSMENT: 70 y.o. [place], [place] woman:  1. Status post left lumpectomy and left axillary sentinel node biopsy on 01/02/2012 for a pT1c pN0,stage IA invasive ductal carcinoma, grade 2, ER 100%, PR 100%, Ki-67 6%, HER-2/neu negative for amplification  2. Status post radiation therapy from 01/25/2012 through 03/09/2012.  3. The patient started anastrozole in 03/2012, the plan being to continue for a total of 5 years (until June 2018).  4. Osteopenia: the patient's bone density scan on 03/14/2012 showed a T score of -1.8. (a) on Fosamax. (b) repeat bone density July 2015 showed a T score of -1.1 (improved)  PLAN: Bera is tolerating the anastrozole well. She gets it at no cost. We discussed the new data that suggests that 10 years of aromatase inhibitors is superior to 5 years. The differences small but measurable. It bounced about 3% if her and send recurrence. Since she is also tolerating the Fosamax well, this may be something she will want to consider.  I suggested she go back to [employer] sne[employer]. I also offered her referral to a urologist to discuss the stress urinary incontinence further. She wants to wait on that.  We discussed our new survivorship program. She will see our survivorship nurse practitioner a year from now and she will see me again in 2 years. At that time we will discuss what she wants to do regarding treatment and further follow-up     Breast cancer of upper-outer quadrant of left female breast ([place])   01/02/2012 Definitive Surgery Left lumpectomy/SLNB: invasive ductal carcinoma, grade 2, ER+ (100%), PR+ (100%), HER2/neu negative, Ki67 6%   01/02/2012 Pathologic Stage Stage IA: T1c N0   01/25/2012 - 03/09/2012 Radiation Therapy Adjuvant RT left breast   03/2012 -   Anti-estrogen oral therapy Anastrozole 1 mg daily.  Planned duration of therapy 5-10 years    INTERVAL HISTORY:  Krystal Payne presents to the [facility] today for ongoing follow up regarding her history of breast cancer. Overall, Krystal Payne reports doing well since her last visit with Dr. Jana Hakim in June 2016.  She denies any mass or other change within her breast.  She continues on her anastrozole, which she tolerates well without significant hot flash or vaginal dryness.  She has some joint pain / stiffness in her hands, which is relieved by an occasional acetaminophen.  She recently returned from a trip to [place] with her grandchildren, which she enjoyed greatly.  She is not currently participating in [employer] as she is caring for her 45 month old grandson, which keeps her busy.   She denies any headache, cough, shortness of breath, or bone pain other than above mentioned joint pain in hands. She reports a good appetite and denies any weight loss. She is in the process of undergoing a dental implant, which is proceeding well. 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  She denies abdominal pain, nausea, vomiting, or jaundice.     REVIEW OF SYSTEMS:  General: Denies fever, chills, unintentional weight loss, or generalized fatigue.  HEENT: Dental work as above. Denies visual changes, hearing loss, mouth sores, or difficulty swallowing. Cardiac: Occasional palpitations (rare). Denies palpitations and lower extremity edema.  Respiratory:  Denies wheeze or dyspnea on exertion.  Breast: As above. GI: Denies abdominal pain, constipation, diarrhea, nausea, or vomiting.  GU: History of UTIs.  Denies dysuria, hematuria, vaginal bleeding, vaginal discharge, or vaginal dryness.  Musculoskeletal: As above. Neuro: Denies recent fall or numbness /  tingling in her extremities.  Skin: BCC removal several years ago from chest. Denies rash, pruritis, or open wounds.  Psych: Denies depression, anxiety, insomnia, or memory loss.   A 14-point review of systems was completed and was negative, except as noted above.   ONCOLOGY TREATMENT TEAM:  1. Surgeon:  Dr. Donne Hazel at [facility]  2. Medical Oncologist: Dr. Jana Hakim 3. Radiation Oncologist: Dr. Valere Dross    PAST MEDICAL/SURGICAL HISTORY:  Past Medical History  Diagnosis Date  . Hyperlipidemia   . Breast cancer ([place])     left breast  . History of radiation therapy 01/25/12 - 03/09/12    left breast  . Urgency of urination   . Arm fracture, left 1969  . Basal cell carcinoma     By midsternal area   Past Surgical History  Procedure Laterality Date  . Breast biopsy      left  . Breast lumpectomy      left lumpectomy, snbx  . Vaginal hysterectomy  1985     ALLERGIES:  No Known Allergies   CURRENT MEDICATIONS:  Current Outpatient Prescriptions on File Prior to Visit  Medication Sig Dispense Refill  . alendronate (FOSAMAX) 35 MG tablet TAKE 1 TABLET EVERY 7 DAYS WITH FULL GLASS OF WATER ON AN EMPTY STOMACH 12 tablet 2  . anastrozole (ARIMIDEX) 1 MG tablet Take 1 tablet (1 mg total) by mouth daily. 90 tablet 3  . cholecalciferol (VITAMIN D) 1000 UNITS tablet Take 2,000 Units by mouth daily.    Marland Kitchen oxybutynin (DITROPAN) 5 MG tablet Take 5 mg by mouth daily.     . simvastatin (ZOCOR) 20 MG tablet Take 20 mg by mouth every evening.     No current facility-administered medications on file prior to visit.     ONCOLOGIC FAMILY HISTORY:  Family History  Problem Relation Age of Onset  . Heart disease Mother   . Diabetes Mother   . Heart disease Sister   . Cancer Brother     colon//under treatment  . Cancer Maternal Aunt     breast/dx 16 years ago/currently 100  . Alzheimer's disease Father   . COPD Brother   . Heart disease Brother   . Heart disease Brother     . Heart attack Brother   . Lung disease Brother      GENETIC COUNSELING/TESTING: No   SOCIAL HISTORY:  Krystal Payne is married and lives with her spouse in [place], [place].  She has 2 children. 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  She has 3 grandchildren, including a 65 month old grandson for whom she currently cares for (as above).  She is a former smoker and denies any current or history of illicit drug use.  She uses alcohol on occasion.   PHYSICAL EXAMINATION:  Vital Signs: Filed Vitals:   04/27/16 0903  BP: 109/68  Pulse: 79  Temp: 98.3 F (36.8 C)  Resp: 18   Weight: 186 (down 1# since her visit in 04/2015 - stable) ECOG performance status: 0 General: Well-nourished, well-appearing female in no acute distress.  She is unaccompanied in clinic today.   HEENT: Head is atraumatic and normocephalic.  Pupils equal and  reactive to light and accomodation. Conjunctivae clear without exudate.  Sclerae anicteric. Oral mucosa is pink, moist, and intact without lesions.  Oropharynx is pink without lesions or erythema.  Lymph: No cervical, supraclavicular, infraclavicular, or axillary lymphadenopathy noted on palpation.  Cardiovascular: Regular rate and rhythm without murmurs, rubs, or gallops. Respiratory: Clear to auscultation bilaterally. Chest expansion symmetric without accessory muscle use on inspiration or expiration.  Breast: Bilateral breast exam performed.  Left lumpectomy scar intact without nodularity.  No mass or lesion in either breast. GI: Abdomen soft and round. No tenderness to palpation. Bowel sounds normoactive in 4 quadrants. No hepatosplenomegaly.   GU: Deferred.  Musculoskeletal: Muscle strength 5/5 in all extremities.   Neuro: No focal deficits. Steady gait.  Psych: Mood and affect normal and appropriate for situation.  Extremities: No edema, cyanosis, or clubbing.  Skin: Warm and dry. No open lesions noted.    LABORATORY DATA:  Recent Results (from the past 2160 hour(s))  CBC with Differential     Status: None   Collection Time: 04/27/16  8:43 AM  Result Value Ref Range   WBC 5.3 3.9 - 10.3 10e3/uL   NEUT# 3.2 1.5 - 6.5 10e3/uL   HGB 13.7 11.6 - 15.9 g/dL   HCT 41.0 34.8 - 46.6 %   Platelets 250 145 - 400 10e3/uL   MCV 92.3 79.5 - 101.0 fL   MCH 30.9 25.1 - 34.0 pg   MCHC 33.4 31.5 - 36.0 g/dL   RBC 4.44 3.70 - 5.45 10e6/uL   RDW 12.9 11.2 - 14.5 %   lymph# 1.5 0.9 - 3.3 10e3/uL   MONO# 0.3 0.1 - 0.9 10e3/uL   Eosinophils Absolute 0.2 0.0 - 0.5 10e3/uL   Basophils Absolute 0.0 0.0 - 0.1 10e3/uL   NEUT% 60.3 38.4 - 76.8 %   LYMPH% 29.0 14.0 - 49.7 %   MONO% 6.3 0.0 - 14.0 %   EOS% 3.6 0.0 - 7.0 %   BASO% 0.8 0.0 - 2.0 %  Comprehensive metabolic panel     Status: Abnormal   Collection Time: 04/27/16  8:43 AM  Result Value Ref Range   Sodium 142 136 - 145 mEq/L   Potassium 4.2 3.5 - 5.1 mEq/L   Chloride 108 98 - 109 mEq/L   CO2 24 22 - 29 mEq/L   Glucose 78 70 - 140 mg/dl    Comment: Glucose reference range is for nonfasting patients. Fasting glucose reference range is 70- 100.   BUN 16.9 7.0 - 26.0 mg/dL   Creatinine 0.8 0.6 - 1.1 mg/dL   Total Bilirubin 0.30 0.20 - 1.20 mg/dL   Alkaline Phosphatase 86 40 - 150 U/L   AST 12 5 - 34 U/L   ALT 15 0 - 55 U/L   Total Protein 7.2 6.4 - 8.3 g/dL   Albumin 3.7 3.5 - 5.0 g/dL   Calcium 9.7 8.4 - 10.4 mg/dL   Anion Gap 10 3 - 11 mEq/L   EGFR 81 (L) >90 ml/min/1.73 m2    Comment: eGFR is calculated using the CKD-EPI Creatinine Equation (2009)        ASSESSMENT AND PLAN:   1. Breast cancer: Stage IA invasive ductal carcinoma of the left breast (2013), ER positive, PR positive, HER2/neu negative, S/P lumpectomy (12/2011) followed by adjuvant radiation therapy to the left breast (completed 02/2012) with adjuvant endocrine therapy with letrozole begun 6/013.  Krystal Payne is doing well with no clinical symptoms worrisome for cancer  recurrence at this time. Stable  labs. I have reviewed the recommendations for ongoing surveillance with her and she will follow-up with her medical oncologist,  Dr. Jana Hakim, in June 2018 with history and physical exam per surveillance protocol. She will be due mammogram in February 2018 and will obtain it at that time.  She will continue her anti-estrogen therapy with anastrozole at this time.  She was instructed to make [place] aware if she notes any change within her breast, any new symptoms such as pain, shortness of breath, weight loss, or fatigue.  She will also report any new or increased side effects of the endocrine therapy or any difficulties with it.     2. 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  She was also born in 1947 and is among those in a higher risk cohort.  She has inquired about Hep C screening today and we will perform it based on these factors.  If positive, we discussed the need to coordinate additional follow up with her PCP and gastroenterology.    3. Bone health:  Given Krystal Payne's age/history of breast cancer and her current treatment regimen including endocrine therapy with anastrozole, she is at risk for bone demineralization.  Per our records, her last DEXA scan was performed in 2015 revealing osteopenia. She is now due updated imaging and we will obtain it following today's visit scheduling it in [place] per her request.  She will continue her fosamax at this time.  As above, she is under the care of a dentist and will be undergoing implant soon.  Per her report, he is aware of her being on bisphosphonate therapy and is monitoring it. Her history of peridontal issues began well before her beginning bisphosphonate therapy.  We will continue to monitor this closely for any further changes as a result of endocrine therapy.  In the meantime, she was  encouraged to increase her consumption of foods rich in calcium and vitamin D as well as to increase her weight-bearing activities.  She was given education on specific activities to promote bone health.  4. Cancer screening:  Due to Krystal Payne's history and her age, she should receive screening for skin cancers and colon cancer.  She is S/P hysterectomy. The information and recommendations were shared with the patient and in her written after visit summary.   5. Support services/counseling: Krystal Payne was offered support today through active listening and expressive supportive counseling.     A total of 25 minutes of face-to-face time was spent with this patient with greater than 50% of that time in counseling and care-coordination.   Sylvan Cheese, NP  Survivorship Program [facility] [phone]   Note: PRIMARY CARE PROVIDER Juluis Pitch, [place] [phone]

## 2016-04-27 NOTE — Patient Instructions (Signed)
Thank you for coming in today!  I will call you with the results of your blood test.  As we discussed, please continue to perform your self breast exam and report any changes. If you note any new symptoms (please see below), be sure to notify us ASAP.  Your mammogram will be due in February 2018 and your bone density is due in July 2017.  We will enter orders for that today.   We'll have you return in one year's time with Dr. Jana Hakim for your next appointment or sooner if you have any problems. Please be sure to stop by scheduling on your way out to make those appointment(s).  Looking forward to working with you in the future!  Let us know if you have any questions!  Symptoms to Watch for and Report to Your Provider  . Return of the cancer symptoms you had before- such as a lump or new growth where your cancer first started . New or unusual pain that seems unrelated to an injury and does not go away, including back pain or bone pain . Weight loss without trying/intending . Unexplained bleeding . A rash or allergic reaction, such as swelling, severe itching or wheezing . Chills or fevers . Persistent headaches . Shortness of breath or difficulty breathing . Bloody stools or blood in your urine . Lumps, bumps, swelling and/or nipple discharge . Nausea, vomiting, diarrhea, loss of appetite, or trouble swallowing . A cough that doesn't go away . Abdominal pain . Swelling in your arms or legs . Fractures . Hot flashes or other menopausal symptoms . Any other signs mentioned by your doctor or nurse or any unusual symptoms                 that you just can't explain   NOTE: Just because you have certain symptoms, it doesn't mean the cancer has come back or you have a new cancer. Symptoms can be due to other problems that need to be addressed.  It is important to watch for these symptoms and report them to your provider so you can be medically evaluated for any of these concerns!   Living a Life of  Wellness After Cancer:  *Note: Please consult your health care provider before using any medications, supplements, over-the-counter products, or other interventions.  Also, please consult your primary care provider before you begin any lifestyle program (diet, exercise, etc.).  Your safety is our top priority and we want to make sure you continue to live a long and healthy life!    Healthy Lifestyle Recommendations  As a cancer survivor, it is important develop a lifelong commitment to a healthy lifestyle. A healthy lifestyle can prevent cancer from returning as well as prevent other diseases like heart disease, diabetes and high blood pressure.  These are some things that you can do to have a healthy lifestyle:  Marland Kitchen Maintain a healthy weight.  . Exercise daily per your doctor's orders. . Eat a balanced diet high in fruits, vegetables, bran, and fiber. Limit intake of red meat      and processed foods.  . Limit how much alcohol you consume, if at all. Ali Lowe regular bone mineral density testing for osteoporosis.  . Talk to your doctor about cardiovascular disease or "heart disease" screening. . Stop smoking (if you smoke). . Know your family history. . Be mindful of your emotional, social, and spiritual needs. . Meet regularly with a Primary Care Provider (PCP). Find a PCP if  you do not             already have one. . Talk to your doctor about regular cancer screening including screening for colon           cancer, GYN cancers, and skin cancer.

## 2016-04-28 LAB — HEPATITIS C ANTIBODY (REFLEX): HCV Ab: <0.1 s/co ratio (ref 0.0–0.9)

## 2016-05-06 ENCOUNTER — Other Ambulatory Visit: Payer: Self-pay | Admitting: Oncology

## 2016-05-30 ENCOUNTER — Ambulatory Visit
Admission: RE | Admit: 2016-05-30 | Discharge: 2016-05-30 | Disposition: A | Discharge status: Home / Self Care | Payer: Commercial Managed Care - HMO | Source: Ambulatory Visit | Attending: Nurse Practitioner | Admitting: Nurse Practitioner

## 2016-05-30 DIAGNOSIS — M8588 Other specified disorders of bone density and structure, other site: Secondary | ICD-10-CM | POA: Insufficient documentation

## 2016-05-30 DIAGNOSIS — Z780 Asymptomatic menopausal state: Secondary | ICD-10-CM | POA: Diagnosis not present

## 2016-05-30 DIAGNOSIS — M810 Age-related osteoporosis without current pathological fracture: Secondary | ICD-10-CM | POA: Insufficient documentation

## 2016-05-30 DIAGNOSIS — Z853 Personal history of malignant neoplasm of breast: Secondary | ICD-10-CM | POA: Insufficient documentation

## 2016-11-10 ENCOUNTER — Other Ambulatory Visit: Payer: Self-pay | Admitting: Oncology

## 2016-11-10 DIAGNOSIS — Z853 Personal history of malignant neoplasm of breast: Secondary | ICD-10-CM

## 2016-12-19 ENCOUNTER — Ambulatory Visit
Admission: RE | Admit: 2016-12-19 | Discharge: 2016-12-19 | Disposition: A | Discharge status: Home / Self Care | Payer: PPO | Source: Ambulatory Visit | Attending: Oncology | Admitting: Oncology

## 2016-12-19 DIAGNOSIS — Z853 Personal history of malignant neoplasm of breast: Secondary | ICD-10-CM

## 2016-12-19 DIAGNOSIS — R922 Inconclusive mammogram: Secondary | ICD-10-CM | POA: Diagnosis not present

## 2017-02-01 DIAGNOSIS — N3281 Overactive bladder: Secondary | ICD-10-CM | POA: Diagnosis not present

## 2017-02-01 DIAGNOSIS — Z853 Personal history of malignant neoplasm of breast: Secondary | ICD-10-CM | POA: Diagnosis not present

## 2017-02-01 DIAGNOSIS — Z0000 Encounter for general adult medical examination without abnormal findings: Secondary | ICD-10-CM | POA: Diagnosis not present

## 2017-02-01 DIAGNOSIS — E7800 Pure hypercholesterolemia, unspecified: Secondary | ICD-10-CM | POA: Diagnosis not present

## 2017-02-01 DIAGNOSIS — M8580 Other specified disorders of bone density and structure, unspecified site: Secondary | ICD-10-CM | POA: Diagnosis not present

## 2017-02-02 DIAGNOSIS — E7800 Pure hypercholesterolemia, unspecified: Secondary | ICD-10-CM | POA: Diagnosis not present

## 2017-02-02 DIAGNOSIS — Z853 Personal history of malignant neoplasm of breast: Secondary | ICD-10-CM | POA: Diagnosis not present

## 2017-02-02 DIAGNOSIS — M8580 Other specified disorders of bone density and structure, unspecified site: Secondary | ICD-10-CM | POA: Diagnosis not present

## 2017-02-06 ENCOUNTER — Other Ambulatory Visit: Payer: Self-pay | Admitting: *Deleted

## 2017-02-06 MED ORDER — ANASTROZOLE 1 MG PO TABS: 1.0000 mg | ORAL_TABLET | Freq: Every day | ORAL | 3 refills | Status: DC

## 2017-02-06 MED ORDER — ALENDRONATE SODIUM 35 MG PO TABS: ORAL_TABLET | ORAL | 2 refills | Status: DC

## 2017-02-10 DIAGNOSIS — M25512 Pain in left shoulder: Secondary | ICD-10-CM | POA: Diagnosis not present

## 2017-02-10 DIAGNOSIS — M75102 Unspecified rotator cuff tear or rupture of left shoulder, not specified as traumatic: Secondary | ICD-10-CM | POA: Diagnosis not present

## 2017-02-10 DIAGNOSIS — M7582 Other shoulder lesions, left shoulder: Secondary | ICD-10-CM | POA: Diagnosis not present

## 2017-02-10 DIAGNOSIS — M25511 Pain in right shoulder: Secondary | ICD-10-CM | POA: Diagnosis not present

## 2017-02-17 DIAGNOSIS — M6281 Muscle weakness (generalized): Secondary | ICD-10-CM | POA: Diagnosis not present

## 2017-02-17 DIAGNOSIS — M25512 Pain in left shoulder: Secondary | ICD-10-CM | POA: Diagnosis not present

## 2017-02-17 DIAGNOSIS — M25612 Stiffness of left shoulder, not elsewhere classified: Secondary | ICD-10-CM | POA: Diagnosis not present

## 2017-02-20 DIAGNOSIS — M25512 Pain in left shoulder: Secondary | ICD-10-CM | POA: Diagnosis not present

## 2017-03-03 DIAGNOSIS — M25512 Pain in left shoulder: Secondary | ICD-10-CM | POA: Diagnosis not present

## 2017-03-07 DIAGNOSIS — M25512 Pain in left shoulder: Secondary | ICD-10-CM | POA: Diagnosis not present

## 2017-03-10 DIAGNOSIS — M25512 Pain in left shoulder: Secondary | ICD-10-CM | POA: Diagnosis not present

## 2017-04-21 ENCOUNTER — Other Ambulatory Visit: Payer: Self-pay | Admitting: *Deleted

## 2017-04-21 DIAGNOSIS — C50412 Malignant neoplasm of upper-outer quadrant of left female breast: Secondary | ICD-10-CM

## 2017-04-24 ENCOUNTER — Other Ambulatory Visit (HOSPITAL_BASED_OUTPATIENT_CLINIC_OR_DEPARTMENT_OTHER): Payer: PPO

## 2017-04-24 ENCOUNTER — Ambulatory Visit (HOSPITAL_BASED_OUTPATIENT_CLINIC_OR_DEPARTMENT_OTHER): Payer: PPO | Admitting: Oncology

## 2017-04-24 VITALS — BP 129/78 | HR 69 | Temp 98.1°F | Resp 18 | Ht 65.0 in | Wt 193.4 lb

## 2017-04-24 DIAGNOSIS — M8580 Other specified disorders of bone density and structure, unspecified site: Secondary | ICD-10-CM

## 2017-04-24 DIAGNOSIS — C50812 Malignant neoplasm of overlapping sites of left female breast: Secondary | ICD-10-CM

## 2017-04-24 DIAGNOSIS — C50412 Malignant neoplasm of upper-outer quadrant of left female breast: Secondary | ICD-10-CM

## 2017-04-24 DIAGNOSIS — Z170 Estrogen receptor positive status [ER+]: Secondary | ICD-10-CM

## 2017-04-24 DIAGNOSIS — Z79811 Long term (current) use of aromatase inhibitors: Secondary | ICD-10-CM

## 2017-04-24 LAB — COMPREHENSIVE METABOLIC PANEL
ALBUMIN: 4 g/dL (ref 3.5–5.0)
ALK PHOS: 85 U/L (ref 40–150)
ALT: 21 U/L (ref 0–55)
AST: 14 U/L (ref 5–34)
Anion Gap: 8 mEq/L (ref 3–11)
BILIRUBIN TOTAL: 0.43 mg/dL (ref 0.20–1.20)
BUN: 15 mg/dL (ref 7.0–26.0)
CALCIUM: 9.9 mg/dL (ref 8.4–10.4)
CO2: 26 mEq/L (ref 22–29)
Chloride: 107 mEq/L (ref 98–109)
Creatinine: 0.8 mg/dL (ref 0.6–1.1)
EGFR: 80 mL/min/{1.73_m2} — ABNORMAL LOW (ref >90)
GLUCOSE: 98 mg/dL (ref 70–140)
Potassium: 4.6 mEq/L (ref 3.5–5.1)
SODIUM: 142 meq/L (ref 136–145)
TOTAL PROTEIN: 7.1 g/dL (ref 6.4–8.3)

## 2017-04-24 LAB — CBC WITH DIFFERENTIAL/PLATELET
BASO%: 0.7 % (ref 0.0–2.0)
BASOS ABS: 0 10*3/uL (ref 0.0–0.1)
EOS%: 2.3 % (ref 0.0–7.0)
Eosinophils Absolute: 0.1 10*3/uL (ref 0.0–0.5)
HEMATOCRIT: 41.7 % (ref 34.8–46.6)
HEMOGLOBIN: 13.9 g/dL (ref 11.6–15.9)
LYMPH#: 1.4 10*3/uL (ref 0.9–3.3)
LYMPH%: 24.6 % (ref 14.0–49.7)
MCH: 31.6 pg (ref 25.1–34.0)
MCHC: 33.3 g/dL (ref 31.5–36.0)
MCV: 94.8 fL (ref 79.5–101.0)
MONO#: 0.3 10*3/uL (ref 0.1–0.9)
MONO%: 5.3 % (ref 0.0–14.0)
NEUT#: 3.8 10*3/uL (ref 1.5–6.5)
NEUT%: 67.1 % (ref 38.4–76.8)
NRBC: 0 % (ref 0–0)
Platelets: 238 10*3/uL (ref 145–400)
RBC: 4.4 10*6/uL (ref 3.70–5.45)
RDW: 12.8 % (ref 11.2–14.5)
WBC: 5.7 10*3/uL (ref 3.9–10.3)

## 2017-04-24 NOTE — Progress Notes (Signed)
[facility]  [phone] Fax:(336) [phone]  OFFICE PROGRESS NOTE   ID: Krystal Payne   DOB: [birth year]  MR#: [id number]  PRX#:[id number]   PCP: Krystal Pitch, MD GYN:  SU: Krystal Payne, M.D. RAD ONC: Krystal Payne, M.D.  CHIEF COMPLAINT:  Hx of Left Breast Cancer   CURRENT TREATMENT: Anastrozole    HISTORY OF PRESENT ILLNESS: From Dr. Collier Salina Payne's new patient evaluation note dated 01/12/2012:  "[name] in excellent health who presents with an abnormal mammogram. Screening mammogram 12/01/2011 showed a possible mass in the left breast. Diagnostic mammogram and ultrasound performed on 12/15/2011 showed a subtle the palpable mass at 3:00 position. On ultrasound this measured 1.1 x 0.8 x 0.7 cm. A biopsy was performed on 12/15/2011 which showed invasive mammary carcinoma grade 1/2 ER +100% PR +100%, proliferative index 6% HER-2 was amplified with a ratio of 1.36 bilateral MRI exam on 12/21/2011 showed a solitary 11 x 9 x 9 mm mass is doing third of her left breast. No other abnormalities were seen in a suspicious lesion in the liver was likely a felt to be a cyst. The patient underwent a lumpectomy and sentinel lymph node evaluation on 01/02/2012. Final pathology showed a 1.1 cm grade 1/2 cancer with 1 and negative sentinel lymph node and clear surgical margins. She does have an unremarkable postoperative course. A. intraoperatively in the MammoSite catheter was placed subsequently this was removed and was realized that the distance to the skin was too close. She has been referred for external beam irradiation and it seems as for a medical oncology consultation."  Her subsequent history is as detailed below.   INTERVAL HISTORY: Krystal Payne returns today for follow-up of her estrogen receptor positive breast cancer. She continues on anastrozole, with good tolerance.  Hot flashes and vaginal dryness are not a major issue. She never  developed the arthralgias or myalgias that many patients can experience on this medication. She obtains it at a good price.  She is also on Fosamax weekly. She has no reflux or other symptoms from that medication that she is aware of.  REVIEW OF SYSTEMS: She had been taking care of her grandchild at home for the last 2 years, but he is now going to day school so she and her husband will have a little bit more time to exercise. There have not started this yet however. Aside from that a detailed review of systems today was entirely benign    PAST MEDICAL HISTORY: Past Medical History:  Diagnosis Date  . Arm fracture, left 1969  . Basal cell carcinoma    By midsternal area  . Breast cancer ([place])    left breast  . History of radiation therapy 01/25/12 - 03/09/12   left breast  . Hyperlipidemia   . Urgency of urination     PAST SURGICAL HISTORY: Past Surgical History:  Procedure Laterality Date  . BREAST BIOPSY     left  . BREAST LUMPECTOMY     left lumpectomy, snbx  . VAGINAL HYSTERECTOMY  1985    FAMILY HISTORY Family History  Problem Relation Age of Onset  . Heart disease Mother   . Diabetes Mother   . Cancer Brother        colon//under treatment  . Cancer Maternal Aunt        breast/dx 16 years ago/currently 43  . Alzheimer's disease Father   . COPD Brother   . Heart disease Brother   . Heart  disease Brother   . Heart attack Brother   . Lung disease Brother   . Heart disease Sister   She is one of 12 children in her family; one sibling has died of myocardial infarction. She has another brother who was diagnosed with stage IIIC colon cancer. She is a strong family history of colonic polyps and her family including herself.    GYNECOLOGIC HISTORY:  (Reviewed 04/21/2014) G2  P2, menarche 37,  menopause at time of hysterectomy in 1985 and ovaries were retained.   SOCIAL HISTORY:  (Updated 04/21/2014) The patient has been married to Krystal Engineering" Payne since 1970.  He  is a retired Systems developer.  She retired from being a Administrator, arts in 2009.  The patient and her husband have 2 adult children, 1 son and 1 daughter that both live in [place], [place]. They have 2 grandchildren who as of 71 are 25 and 7 years old. The patient enjoys going to the beach, antiquing, listening to music and doing pro bono patient advocate activities in her spare time.  She also participates in [employer] at the [facility] 3-4 times weekly.  ADVANCED DIRECTIVES:  The patient states she has a living will.  HEALTH MAINTENANCE:  (Updated 04/21/2014) Social History  Substance Use Topics  . Smoking status: Former Smoker    Packs/day: 0.25    Years: 2.00    Types: Cigarettes    Quit date: 10/31/1968  . Smokeless tobacco: Never Used  . Alcohol use Yes     Comment: occasional beer or wine    Colonoscopy:  UTD PAP: UTD Bone density:  The patient's last bone density scan at [facility] on 03/14/2012 showed a T score of -1.8 (osteopenia). Lipid panel: Not on file   No Known Allergies  Current Outpatient Prescriptions  Medication Sig Dispense Refill  . alendronate (FOSAMAX) 35 MG tablet TAKE 1 TABLET EVERY 7 DAYS WITH FULL GLASS OF WATER ON AN EMPTY STOMACH 12 tablet 2  . anastrozole (ARIMIDEX) 1 MG tablet Take 1 tablet (1 mg total) by mouth daily. 90 tablet 3  . cholecalciferol (VITAMIN D) 1000 UNITS tablet Take 2,000 Units by mouth daily.    . cyclobenzaprine (FLEXERIL) 5 MG tablet Take 1 tablet by mouth daily as needed.    Marland Kitchen oxybutynin (DITROPAN) 5 MG tablet Take 5 mg by mouth daily.     . simvastatin (ZOCOR) 20 MG tablet Take 20 mg by mouth every evening.     No current facility-administered medications for this visit.     OBJECTIVE: Middle-aged white woman Who appears well  Vitals:   04/24/17 1053  BP: 129/78  Pulse: 69  Resp: 18  Temp: 98.1 F (36.7 C)     Body mass index is 32.18 kg/m.    ECOG:  0 Filed Weights   04/24/17 1053  Weight: 193  lb 6.4 oz (87.7 kg)   Sclerae unicteric, pupils round and equal Oropharynx clear and moist No cervical or supraclavicular adenopathy Lungs no rales or rhonchi Heart regular rate and rhythm Abd soft, nontender, positive bowel sounds MSK no focal spinal tenderness, no upper extremity lymphedema Neuro: nonfocal, well oriented, appropriate affect Breasts: The right breast is benign. The left breast as undergone lumpectomy and radiation with no evidence of local recurrence. Both axillae are benign.  LAB RESULTS: Lab Results  Component Value Date   WBC 5.7 04/24/2017   NEUTROABS 3.8 04/24/2017   HGB 13.9 04/24/2017   HCT 41.7 04/24/2017  MCV 94.8 04/24/2017   PLT 238 04/24/2017      Chemistry      Component Value Date/Time   NA 142 04/24/2017 1020   K 4.6 04/24/2017 1020   CL 106 04/01/2013 1031   CO2 26 04/24/2017 1020   BUN 15.0 04/24/2017 1020   CREATININE 0.8 04/24/2017 1020      Component Value Date/Time   CALCIUM 9.9 04/24/2017 1020   ALKPHOS 85 04/24/2017 1020   AST 14 04/24/2017 1020   ALT 21 04/24/2017 1020   BILITOT 0.43 04/24/2017 1020      STUDIES: Bone density at [facility] showed a T score of -1.2 on July 2017. Bilateral mammography with tomography at the [facility] 12/19/2016 found the breast density to be category C. There was no evidence of malignancy.  ASSESSMENT: 71 y.o. [place], [place] woman:  1.  Status post left lumpectomy and left axillary sentinel node biopsy on 01/02/2012 for a pT1c pN0,stage IA invasive ductal carcinoma, grade 2,  ER 100%, PR 100%, Ki-67 6%, HER-2/neu negative for amplification  2.  Status post radiation therapy from 01/25/2012 through 03/09/2012.  3.  The patient started anastrozole in 03/2012, the plan being to continue for a total of 5 years (until June 2018).  4.   Osteopenia: the patient's bone density scan on 03/14/2012 showed a T score of -1.8.  (a) on Fosamax.  (b) repeat bone density July 2015  showed a T score of -1.1 (improved)  PLAN: Margreat is now a little over 5 years out from definitive surgery for her breast cancer with no evidence of disease recurrence. This is very favorable.  She is also 5 years into anastrozole. We discussed the recent data that shows that 7 years of anastrozole is as good as 10 and therefore if she wishes to continue anastrozole 2 more years is all that would be required.  We reviewed the fact that the benefit is marginal. There is generally a 3% drop in breast cancer which means that 1% drop in local recurrence, and 1% decrease in the risk of a new breast cancer developing, and the important one is a 1% decrease in the risk of this cancer showing up somewhere else in her system which would be stage IV. She has a good understanding of the possible benefits as well as the possible toxicities side effects and complications. We reviewed the results of her bone density from July of last year, which showed at an excellent T score of -1.2.  Given all at the decision was to continue for 2 more years. I have refilled her anastrozole prescription and she will return to see me in one year.  She knows to call for any problems that may develop before the next visit. Chauncey Cruel, MD   04/24/2017, 11:11 AM

## 2017-07-11 DIAGNOSIS — N390 Urinary tract infection, site not specified: Secondary | ICD-10-CM | POA: Diagnosis not present

## 2017-07-11 DIAGNOSIS — R300 Dysuria: Secondary | ICD-10-CM | POA: Diagnosis not present

## 2017-08-01 DIAGNOSIS — N3090 Cystitis, unspecified without hematuria: Secondary | ICD-10-CM | POA: Diagnosis not present

## 2017-08-01 DIAGNOSIS — R300 Dysuria: Secondary | ICD-10-CM | POA: Diagnosis not present

## 2017-08-07 DIAGNOSIS — M7582 Other shoulder lesions, left shoulder: Secondary | ICD-10-CM | POA: Diagnosis not present

## 2017-08-07 DIAGNOSIS — M75112 Incomplete rotator cuff tear or rupture of left shoulder, not specified as traumatic: Secondary | ICD-10-CM | POA: Diagnosis not present

## 2017-08-31 DIAGNOSIS — Z86010 Personal history of colonic polyps: Secondary | ICD-10-CM | POA: Diagnosis not present

## 2017-09-11 DIAGNOSIS — L739 Follicular disorder, unspecified: Secondary | ICD-10-CM | POA: Diagnosis not present

## 2017-09-11 DIAGNOSIS — L820 Inflamed seborrheic keratosis: Secondary | ICD-10-CM | POA: Diagnosis not present

## 2017-09-11 DIAGNOSIS — L814 Other melanin hyperpigmentation: Secondary | ICD-10-CM | POA: Diagnosis not present

## 2017-09-11 DIAGNOSIS — D229 Melanocytic nevi, unspecified: Secondary | ICD-10-CM | POA: Diagnosis not present

## 2017-09-11 DIAGNOSIS — D1800 Hemangioma unspecified site: Secondary | ICD-10-CM | POA: Diagnosis not present

## 2017-09-11 DIAGNOSIS — Z85828 Personal history of other malignant neoplasm of skin: Secondary | ICD-10-CM | POA: Diagnosis not present

## 2017-09-11 DIAGNOSIS — L821 Other seborrheic keratosis: Secondary | ICD-10-CM | POA: Diagnosis not present

## 2017-09-11 DIAGNOSIS — Z1283 Encounter for screening for malignant neoplasm of skin: Secondary | ICD-10-CM | POA: Diagnosis not present

## 2017-09-12 ENCOUNTER — Ambulatory Visit: Payer: PPO | Admitting: Urology

## 2017-09-12 ENCOUNTER — Encounter: Payer: Self-pay | Admitting: Urology

## 2017-09-12 VITALS — BP 118/74 | HR 84 | Ht 65.0 in | Wt 188.4 lb

## 2017-09-12 DIAGNOSIS — N3941 Urge incontinence: Secondary | ICD-10-CM | POA: Diagnosis not present

## 2017-09-12 DIAGNOSIS — R3915 Urgency of urination: Secondary | ICD-10-CM

## 2017-09-12 DIAGNOSIS — N390 Urinary tract infection, site not specified: Secondary | ICD-10-CM | POA: Diagnosis not present

## 2017-09-12 LAB — URINALYSIS, COMPLETE
BILIRUBIN UA: NEGATIVE
GLUCOSE, UA: NEGATIVE
NITRITE UA: NEGATIVE
Protein, UA: NEGATIVE
SPEC GRAV UA: 1.025 (ref 1.005–1.030)
Urobilinogen, Ur: 0.2 mg/dL (ref 0.2–1.0)
pH, UA: 5.5 (ref 5.0–7.5)

## 2017-09-12 LAB — BLADDER SCAN AMB NON-IMAGING: Scan Result: 0

## 2017-09-12 LAB — MICROSCOPIC EXAMINATION

## 2017-09-12 MED ORDER — MIRABEGRON ER 25 MG PO TB24: 25.0000 mg | ORAL_TABLET | Freq: Every day | ORAL | 0 refills | Status: DC

## 2017-09-12 NOTE — Progress Notes (Signed)
09/12/2017 2:08 PM   Krystal Payne 07/15/1946 [id number]  Referring provider: Juluis Pitch, MD [place] [place], [place] [address]  Chief Complaint  Patient presents with  . Recurrent UTI    HPI: 71 year old female who presents today for further evaluation of possible recurrent urinary tract infections as well as urinary frequency, urgency, and urge incontinence.  She reports that in August 2018, she developed worsening urinary urgency and urge incontinence.  She was treated on several occasions with multiple antibiotics for presumed UTI.  Each of those occasions, her urine had some small amount of microscopic blood and white blood cells but ultimately none of the cultures grew bacteria.  Her symptoms did wax and wane but never completely resolved.  She denies any dysuria or gross hematuria with her infections.  No pelvic or abdominal pain.  No flank pain.  No history of kidney stones.  She complains urinary urgency exacerbated by standing and position change.  She reports that when rises from a sitting to standing position, she often with associated urge incontinence.  At least 2-3 times per week.  She was prescribed oxybutynin several years ago which was was taking one at nightitime.  She does have a personal history of dry mouth.  Nocturia x2-3.    No stress urinary incontinence.  S/p hysterectomy.  2 NSVD.  Rarely sexually active, not painful.       Personal history of ER+ breast cancer (2013) on anastrozole.   PMH: Past Medical History:  Diagnosis Date  . Arm fracture, left 1969  . Basal cell carcinoma    By midsternal area  . Breast cancer ([place])    left breast  . History of radiation therapy 01/25/12 - 03/09/12   left breast  . Hyperlipidemia   . Urgency of urination     Surgical History: Past Surgical History:  Procedure Laterality Date  . BREAST BIOPSY     left  . BREAST LUMPECTOMY     left lumpectomy, snbx  . BREAST LUMPECTOMY WITH NEEDLE  LOCALIZATION AND AXILLARY SENTINEL LYMPH NODE BX Left 01/02/2012   Performed by Rolm Bookbinder, MD at [facility]  . VAGINAL HYSTERECTOMY  1985    Home Medications:  Allergies as of 09/12/2017   No Known Allergies     Medication List        Accurate as of 09/12/17 11:59 PM. Always use your most recent med list.          alendronate 35 MG tablet Commonly known as:  FOSAMAX TAKE 1 TABLET EVERY 7 DAYS WITH FULL GLASS OF WATER ON AN EMPTY STOMACH   anastrozole 1 MG tablet Commonly known as:  ARIMIDEX Take 1 tablet (1 mg total) by mouth daily.   cholecalciferol 1000 units tablet Commonly known as:  VITAMIN D Take 2,000 Units by mouth daily.   cyclobenzaprine 5 MG tablet Commonly known as:  FLEXERIL Take 1 tablet by mouth daily as needed.   mirabegron ER 25 MG Tb24 tablet Commonly known as:  MYRBETRIQ Take 1 tablet (25 mg total) daily by mouth.   oxybutynin 5 MG tablet Commonly known as:  DITROPAN Take 5 mg by mouth daily.   simvastatin 20 MG tablet Commonly known as:  ZOCOR Take 20 mg by mouth every evening.   vitamin B-12 1000 MCG tablet Commonly known as:  CYANOCOBALAMIN Take 1,000 mcg daily by mouth.       Allergies: No Known Allergies  Family History: Family History  Problem Relation Age  of Onset  . Heart disease Mother   . Diabetes Mother   . Cancer Brother        colon//under treatment  . Bladder Cancer Brother   . Cancer Maternal Aunt        breast/dx 16 years ago/currently 53  . Alzheimer's disease Father   . COPD Brother   . Heart disease Brother   . Heart disease Brother   . Heart attack Brother   . Lung disease Brother   . Heart disease Sister   . Kidney cancer Neg Hx   . Prostate cancer Neg Hx     Social History:  reports that she quit smoking about 48 years ago. Her smoking use included cigarettes. She has a 0.50 pack-year smoking history. she has never used smokeless tobacco. She reports that she drinks alcohol. She reports that she  does not use drugs.  ROS: UROLOGY Frequent Urination?: Yes Hard to postpone urination?: Yes Burning/pain with urination?: No Get up at night to urinate?: Yes Leakage of urine?: No Urine stream starts and stops?: Yes Trouble starting stream?: No Do you have to strain to urinate?: No Blood in urine?: No Urinary tract infection?: Yes Sexually transmitted disease?: No Injury to kidneys or bladder?: No Painful intercourse?: No Weak stream?: No Currently pregnant?: No Vaginal bleeding?: No Last menstrual period?: n  Gastrointestinal Nausea?: No Vomiting?: No Indigestion/heartburn?: Yes Diarrhea?: No Constipation?: No  Constitutional Fever: No Night sweats?: No Weight loss?: No Fatigue?: No  Skin Skin rash/lesions?: No Itching?: Yes  Eyes Blurred vision?: Yes Double vision?: No  Ears/Nose/Throat Sore throat?: No Sinus problems?: No  Hematologic/Lymphatic Swollen glands?: No Easy bruising?: No  Cardiovascular Leg swelling?: No Chest pain?: No  Respiratory Cough?: No Shortness of breath?: No  Endocrine Excessive thirst?: No  Musculoskeletal Back pain?: No Joint pain?: No  Neurological Headaches?: No Dizziness?: No  Psychologic Depression?: No Anxiety?: No  Physical Exam: BP 118/74 (BP Location: Right Arm, Patient Position: Sitting, Cuff Size: Large)   Pulse 84   Ht 5\' 5"  (1.651 m)   Wt 188 lb 6.4 oz (85.5 kg)   BMI 31.35 kg/m   Constitutional:  Alert and oriented, No acute distress. HEENT: [place] AT, moist mucus membranes.  Trachea midline, no masses. Cardiovascular: No clubbing, cyanosis, or edema. Respiratory: Normal respiratory effort, no increased work of breathing. GI: Abdomen is soft, nontender, nondistended, no abdominal masses GU: No CVA tenderness. Skin: No rashes, bruises or suspicious lesions. Neurologic: Grossly intact, no focal deficits, moving all 4 extremities. Psychiatric: Normal mood and affect.  Laboratory Data: Lab  Results  Component Value Date   WBC 5.7 04/24/2017   HGB 13.9 04/24/2017   HCT 41.7 04/24/2017   MCV 94.8 04/24/2017   PLT 238 04/24/2017    Lab Results  Component Value Date   CREATININE 0.8 04/24/2017    Urinalysis Results for orders placed or performed in visit on 09/12/17  CULTURE, URINE COMPREHENSIVE  Result Value Ref Range   Urine Culture, Comprehensive Final report    Organism ID, Bacteria Comment   Microscopic Examination  Result Value Ref Range   WBC, UA 11-30 (A) 0 - 5 /hpf   RBC, UA 3-10 (A) 0 - 2 /hpf   Epithelial Cells (non renal) 0-10 0 - 10 /hpf   Renal Epithel, UA 0-10 (A) None seen /hpf   Mucus, UA Present (A) Not Estab.   Bacteria, UA Moderate (A) None seen/Few  Urinalysis, Complete  Result Value Ref Range   Specific  Gravity, UA 1.025 1.005 - 1.030   pH, UA 5.5 5.0 - 7.5   Color, UA Yellow Yellow   Appearance Ur Clear Clear   Leukocytes, UA Trace (A) Negative   Protein, UA Negative Negative/Trace   Glucose, UA Negative Negative   Ketones, UA Trace (A) Negative   RBC, UA 1+ (A) Negative   Bilirubin, UA Negative Negative   Urobilinogen, Ur 0.2 0.2 - 1.0 mg/dL   Nitrite, UA Negative Negative   Microscopic Examination See below:   Bladder Scan (Post Void Residual) in office  Result Value Ref Range   Scan Result 0     Pertinent Imaging: PVR 0  Assessment & Plan:    1. Recurrent UTI UA suspicious today for infection, will send for culture Previous cultures either negative or consistent with mixed flora Unclear whether her symptoms are related to infection given her non-clonal and urine cultures Hygiene as well as addition of cranberry tabs twice daily and daily probiotic were reviewed Not a candidate for topical estrogen cream given personal history of GYN malignancy Suspect her symptoms are exacerbated by anastrozole/ atrophic vaginitis Recommend return to care in 2 weeks for pelvic exam for further evaluation of anatomy as well as to assess if  microscopic hematuria related to contamination  Consider cystoscopy/hematuria workup if microscopic hematuria present on catheterized specimen in the absence of infection - Urinalysis, Complete - CULTURE, URINE COMPREHENSIVE - Bladder Scan (Post Void Residual) in office  2. Urinary urgency/ urge incontinence Behavioral modification reviewed Symptoms refractory to oxybutynin which also exacerbate her dry mouth Samples of Myrbetriq 25 mg x 4 weeks given today, reviewed side effects Review symptoms at next visit  Return in about 2 weeks (around 09/26/2017) for pelvic/ catheterized UA. (possible cysto)  Hollice Espy, MD  [facility] [address] [place], [place] [address] [phone]

## 2017-09-14 LAB — CULTURE, URINE COMPREHENSIVE

## 2017-09-28 ENCOUNTER — Encounter: Payer: Self-pay | Admitting: Urology

## 2017-09-28 ENCOUNTER — Ambulatory Visit: Payer: PPO | Admitting: Urology

## 2017-09-28 VITALS — BP 109/70 | HR 94 | Ht 65.0 in | Wt 189.0 lb

## 2017-09-28 DIAGNOSIS — N390 Urinary tract infection, site not specified: Secondary | ICD-10-CM | POA: Diagnosis not present

## 2017-09-28 DIAGNOSIS — R3915 Urgency of urination: Secondary | ICD-10-CM | POA: Diagnosis not present

## 2017-09-28 DIAGNOSIS — N3941 Urge incontinence: Secondary | ICD-10-CM | POA: Diagnosis not present

## 2017-09-28 LAB — URINALYSIS, COMPLETE
BILIRUBIN UA: NEGATIVE
GLUCOSE, UA: NEGATIVE
Ketones, UA: NEGATIVE
Leukocytes, UA: NEGATIVE
NITRITE UA: NEGATIVE
PH UA: 6.5 (ref 5.0–7.5)
PROTEIN UA: NEGATIVE
Specific Gravity, UA: 1.02 (ref 1.005–1.030)
UUROB: 0.2 mg/dL (ref 0.2–1.0)

## 2017-09-28 LAB — MICROSCOPIC EXAMINATION: Epithelial Cells (non renal): NONE SEEN /hpf (ref 0–10)

## 2017-10-05 NOTE — Progress Notes (Signed)
09/28/2017 10:41 AM   Krystal Payne July 1945/11/19 [id number]  Referring provider: Juluis Pitch, MD [id number] [place], [place] [address]  Chief Complaint  Patient presents with  . Recurrent UTI    2wk    HPI: 71 year old female with urinary frequency, urgency, nocturia and urge incontinence with possible UTIs who returns today for pelvic exam and catheterized UA.  She reports that in August 2018, she developed worsening urinary urgency and urge incontinence.  She was treated on several occasions with multiple antibiotics for presumed UTI.  Each of those occasions, her urine had some small amount of microscopic blood and white blood cells but ultimately none of the cultures grew bacteria.  Her symptoms did wax and wane but never completely resolved.   She complains urinary urgency exacerbated by standing and position change.  She reports that when rises from a sitting to standing position, she often with associated urge incontinence.    She previously tried oxybutynin at nighttime because dry mouth.  Last visit, she was placed on Myrbetriq 25 mg.  She reports that this helped with her urinary frequency and urgency, however, is not completely resolved and could be better.  She also reports that since starting the medication, she had persistent stuffiness which she is unsure whether or not this is related to the medication.  No stress urinary incontinence.  S/p hysterectomy.  2 NSVD.  Rarely sexually active, not painful.       Personal history of ER+ breast cancer (2013) on anastrozole.   PMH: Past Medical History:  Diagnosis Date  . Arm fracture, left 1969  . Basal cell carcinoma    By midsternal area  . Breast cancer ([place])    left breast  . History of radiation therapy 01/25/12 - 03/09/12   left breast  . Hyperlipidemia   . Urgency of urination     Surgical History: Past Surgical History:  Procedure Laterality Date  . BREAST BIOPSY     left  . BREAST LUMPECTOMY     left lumpectomy, snbx  . VAGINAL HYSTERECTOMY  1985    Home Medications:  Allergies as of 09/28/2017   No Known Allergies     Medication List        Accurate as of 09/28/17 11:59 PM. Always use your most recent med list.          alendronate 35 MG tablet Commonly known as:  FOSAMAX TAKE 1 TABLET EVERY 7 DAYS WITH FULL GLASS OF WATER ON AN EMPTY STOMACH   anastrozole 1 MG tablet Commonly known as:  ARIMIDEX Take 1 tablet (1 mg total) by mouth daily.   cholecalciferol 1000 units tablet Commonly known as:  VITAMIN D Take 2,000 Units by mouth daily.   cyclobenzaprine 5 MG tablet Commonly known as:  FLEXERIL Take 1 tablet by mouth daily as needed.   mirabegron ER 25 MG Tb24 tablet Commonly known as:  MYRBETRIQ Take 1 tablet (25 mg total) daily by mouth.   simvastatin 20 MG tablet Commonly known as:  ZOCOR Take 20 mg by mouth every evening.   vitamin B-12 1000 MCG tablet Commonly known as:  CYANOCOBALAMIN Take 1,000 mcg daily by mouth.       Allergies: No Known Allergies  Family History: Family History  Problem Relation Age of Onset  . Heart disease Mother   . Diabetes Mother   . Cancer Brother        colon//under treatment  . Bladder Cancer Brother   .  Cancer Maternal Aunt        breast/dx 16 years ago/currently 69  . Alzheimer's disease Father   . COPD Brother   . Heart disease Brother   . Heart disease Brother   . Heart attack Brother   . Lung disease Brother   . Heart disease Sister   . Kidney cancer Neg Hx   . Prostate cancer Neg Hx     Social History:  reports that she quit smoking about 48 years ago. Her smoking use included cigarettes. She has a 0.50 pack-year smoking history. she has never used smokeless tobacco. She reports that she drinks alcohol. She reports that she does not use drugs.  ROS: UROLOGY Frequent Urination?: Yes Hard to postpone urination?: Yes Burning/pain with urination?: No Get up at night to urinate?: Yes Leakage of  urine?: Yes Urine stream starts and stops?: No Trouble starting stream?: No Do you have to strain to urinate?: No Blood in urine?: Yes Urinary tract infection?: No Sexually transmitted disease?: No Injury to kidneys or bladder?: No Painful intercourse?: No Weak stream?: No Currently pregnant?: No Vaginal bleeding?: No Last menstrual period?: n  Gastrointestinal Nausea?: No Vomiting?: No Indigestion/heartburn?: No Diarrhea?: No Constipation?: No  Constitutional Fever: No Night sweats?: No Weight loss?: No Fatigue?: No  Skin Skin rash/lesions?: No Itching?: No  Eyes Blurred vision?: No Double vision?: No  Ears/Nose/Throat Sore throat?: No Sinus problems?: No  Hematologic/Lymphatic Swollen glands?: No Easy bruising?: No  Cardiovascular Leg swelling?: No Chest pain?: No  Respiratory Cough?: No Shortness of breath?: No  Endocrine Excessive thirst?: No  Musculoskeletal Back pain?: No Joint pain?: No  Neurological Headaches?: Yes Dizziness?: No  Psychologic Depression?: No Anxiety?: No  Physical Exam: BP 109/70   Pulse 94   Ht 5\' 5"  (1.651 m)   Wt 189 lb (85.7 kg)   BMI 31.45 kg/m   Constitutional:  Alert and oriented, No acute distress. HEENT: [place] AT, moist mucus membranes.  Trachea midline, no masses. Cardiovascular: No clubbing, cyanosis, or edema. Respiratory: Normal respiratory effort, no increased work of breathing. GI: Abdomen is soft, nontender, nondistended, no abdominal masses GU: Normal external genitalia.  Significant atrophic vaginitis appreciated.  Normal urethral meatus with slight hypermobility.  Fairly decent vaginal support without any obvious cystocele or rectocele.  No demonstrable stress urinary incontinence with Valsalva. Skin: No rashes, bruises or suspicious lesions. Neurologic: Grossly intact, no focal deficits, moving all 4 extremities. Psychiatric: Normal mood and affect.  Procedure: Patient's urethral meatus was  prepped using Betadine solution.  A 14 French red rubber catheter was inserted easily and the bladder was drained.  This was sent for urinalysis.  Laboratory Data: Lab Results  Component Value Date   WBC 5.7 04/24/2017   HGB 13.9 04/24/2017   HCT 41.7 04/24/2017   MCV 94.8 04/24/2017   PLT 238 04/24/2017    Lab Results  Component Value Date   CREATININE 0.8 04/24/2017    Urinalysis Results for orders placed or performed in visit on 09/28/17  Microscopic Examination  Result Value Ref Range   WBC, UA 0-5 0 - 5 /hpf   RBC, UA 0-2 0 - 2 /hpf   Epithelial Cells (non renal) None seen 0 - 10 /hpf   Mucus, UA Present (A) Not Estab.   Bacteria, UA Few (A) None seen/Few  Urinalysis, Complete  Result Value Ref Range   Specific Gravity, UA 1.020 1.005 - 1.030   pH, UA 6.5 5.0 - 7.5   Color, UA Yellow  Yellow   Appearance Ur Clear Clear   Leukocytes, UA Negative Negative   Protein, UA Negative Negative/Trace   Glucose, UA Negative Negative   Ketones, UA Negative Negative   RBC, UA Trace (A) Negative   Bilirubin, UA Negative Negative   Urobilinogen, Ur 0.2 0.2 - 1.0 mg/dL   Nitrite, UA Negative Negative   Microscopic Examination See below:     Pertinent Imaging:   Assessment & Plan:    1. Recurrent UTI Urinalysis today obtained by catheterized specimen is completely negative.  There is no evidence of microscopic hematuria or urinary tract infection. Suspect symptoms are not caused by UTI, rather, urinary symptoms exacerbated by atrophic vaginitis related to anastrozole use. Continue to recommend probiotic and cranberry tablets - Urinalysis, Complete  2. Urinary urgency/ urge incontinence Discussed stuffiness which may or may not be related to Myrbetriq 25 mg I would like her to try higher dose, 50 mg of Myrbetriq and samples times 4 weeks were given today  Return in about 6 weeks (around 11/09/2017) for review urinary symptoms.  Hollice Espy, MD  [facility] [address], [place], [place] [address] [phone]

## 2017-11-01 ENCOUNTER — Ambulatory Visit: Payer: PPO | Admitting: Registered Nurse

## 2017-11-01 ENCOUNTER — Encounter: Payer: Self-pay | Admitting: *Deleted

## 2017-11-01 ENCOUNTER — Ambulatory Visit
Admission: RE | Admit: 2017-11-01 | Discharge: 2017-11-01 | Disposition: A | Discharge status: Home / Self Care | Payer: PPO | Source: Ambulatory Visit | Attending: Internal Medicine | Admitting: Internal Medicine

## 2017-11-01 ENCOUNTER — Encounter
Admission: RE | Disposition: A | Discharge status: Home / Self Care | Payer: Self-pay | Source: Ambulatory Visit | Attending: Internal Medicine

## 2017-11-01 DIAGNOSIS — K621 Rectal polyp: Secondary | ICD-10-CM | POA: Diagnosis not present

## 2017-11-01 DIAGNOSIS — Z923 Personal history of irradiation: Secondary | ICD-10-CM | POA: Diagnosis not present

## 2017-11-01 DIAGNOSIS — K5730 Diverticulosis of large intestine without perforation or abscess without bleeding: Secondary | ICD-10-CM | POA: Diagnosis not present

## 2017-11-01 DIAGNOSIS — Z79899 Other long term (current) drug therapy: Secondary | ICD-10-CM | POA: Diagnosis not present

## 2017-11-01 DIAGNOSIS — D123 Benign neoplasm of transverse colon: Secondary | ICD-10-CM | POA: Diagnosis not present

## 2017-11-01 DIAGNOSIS — Z87891 Personal history of nicotine dependence: Secondary | ICD-10-CM | POA: Diagnosis not present

## 2017-11-01 DIAGNOSIS — R3915 Urgency of urination: Secondary | ICD-10-CM | POA: Insufficient documentation

## 2017-11-01 DIAGNOSIS — Z1211 Encounter for screening for malignant neoplasm of colon: Secondary | ICD-10-CM | POA: Diagnosis not present

## 2017-11-01 DIAGNOSIS — K635 Polyp of colon: Secondary | ICD-10-CM | POA: Diagnosis not present

## 2017-11-01 DIAGNOSIS — K648 Other hemorrhoids: Secondary | ICD-10-CM | POA: Diagnosis not present

## 2017-11-01 DIAGNOSIS — K5790 Diverticulosis of intestine, part unspecified, without perforation or abscess without bleeding: Secondary | ICD-10-CM | POA: Diagnosis not present

## 2017-11-01 DIAGNOSIS — E785 Hyperlipidemia, unspecified: Secondary | ICD-10-CM | POA: Diagnosis not present

## 2017-11-01 DIAGNOSIS — Z800 Family history of malignant neoplasm of digestive organs: Secondary | ICD-10-CM | POA: Insufficient documentation

## 2017-11-01 DIAGNOSIS — Z85828 Personal history of other malignant neoplasm of skin: Secondary | ICD-10-CM | POA: Diagnosis not present

## 2017-11-01 DIAGNOSIS — Z90710 Acquired absence of both cervix and uterus: Secondary | ICD-10-CM | POA: Diagnosis not present

## 2017-11-01 DIAGNOSIS — Z853 Personal history of malignant neoplasm of breast: Secondary | ICD-10-CM | POA: Insufficient documentation

## 2017-11-01 DIAGNOSIS — K640 First degree hemorrhoids: Secondary | ICD-10-CM | POA: Diagnosis not present

## 2017-11-01 DIAGNOSIS — Z86010 Personal history of colonic polyps: Secondary | ICD-10-CM | POA: Insufficient documentation

## 2017-11-01 DIAGNOSIS — D128 Benign neoplasm of rectum: Secondary | ICD-10-CM | POA: Diagnosis not present

## 2017-11-01 HISTORY — PX: COLONOSCOPY WITH PROPOFOL: SHX5780

## 2017-11-01 SURGERY — COLONOSCOPY WITH PROPOFOL
Anesthesia: General

## 2017-11-01 MED ORDER — SODIUM CHLORIDE 0.9 % IV SOLN
INTRAVENOUS | Status: DC
  Administered 2017-11-01: 1000 mL via INTRAVENOUS

## 2017-11-01 MED ORDER — PROPOFOL 500 MG/50ML IV EMUL
INTRAVENOUS | Status: DC | PRN
  Administered 2017-11-01: 125 ug/kg/min via INTRAVENOUS

## 2017-11-01 MED ORDER — MIDAZOLAM HCL 2 MG/2ML IJ SOLN
INTRAMUSCULAR | Status: DC | PRN
  Administered 2017-11-01: 2 mg via INTRAVENOUS

## 2017-11-01 MED ORDER — PROPOFOL 10 MG/ML IV BOLUS
INTRAVENOUS | Status: DC | PRN
  Administered 2017-11-01: 50 mg via INTRAVENOUS

## 2017-11-01 MED ORDER — MIDAZOLAM HCL 2 MG/2ML IJ SOLN
INTRAMUSCULAR | Status: AC
  Filled 2017-11-01: qty 2

## 2017-11-01 MED ORDER — PROPOFOL 10 MG/ML IV BOLUS
INTRAVENOUS | Status: AC
  Filled 2017-11-01: qty 20

## 2017-11-01 NOTE — Transfer of Care (Signed)
Immediate Anesthesia Transfer of Care Note  Patient: Krystal Payne  Procedure(s) Performed: Procedure(s): COLONOSCOPY WITH PROPOFOL (N/A)  Patient Location: PACU and Endoscopy Unit  Anesthesia Type:General  Level of Consciousness: sedated  Airway & Oxygen Therapy: Patient Spontanous Breathing and Patient connected to nasal cannula oxygen  Post-op Assessment: Report given to RN and Post -op Vital signs reviewed and stable  Post vital signs: Reviewed and stable  Last Vitals:  Vitals:   11/01/17 0919 11/01/17 1010  BP: (!) 141/82 (!) 104/59  Pulse: 86 85  Resp: 18 15  Temp: 36.7 C 36.4 C  SpO2: 59% 56%    Complications: No apparent anesthesia complications

## 2017-11-01 NOTE — Anesthesia Preprocedure Evaluation (Signed)
Anesthesia Evaluation  Patient identified by MRN, date of birth, ID band Patient awake    Reviewed: Allergy & Precautions, H&P , NPO status , Patient's Chart, lab work & pertinent test results, reviewed documented beta blocker date and time   Airway Mallampati: II   Neck ROM: full    Dental  (+) Poor Dentition   Pulmonary neg pulmonary ROS, former smoker,    Pulmonary exam normal        Cardiovascular Exercise Tolerance: Good negative cardio ROS Normal cardiovascular exam Rhythm:regular Rate:Normal     Neuro/Psych negative neurological ROS  negative psych ROS   GI/Hepatic negative GI ROS, Neg liver ROS,   Endo/Other  negative endocrine ROS  Renal/GU negative Renal ROS  negative genitourinary   Musculoskeletal   Abdominal   Peds  Hematology negative hematology ROS (+)   Anesthesia Other Findings Past Medical History: 1969: Arm fracture, left No date: Basal cell carcinoma     Comment:  By midsternal area No date: Breast cancer ([place])     Comment:  left breast 01/25/12 - 03/09/12: History of radiation therapy     Comment:  left breast No date: Hyperlipidemia No date: Urgency of urination Past Surgical History: No date: BREAST BIOPSY     Comment:  left No date: BREAST LUMPECTOMY     Comment:  left lumpectomy, snbx 1985: VAGINAL HYSTERECTOMY BMI    Body Mass Index:  29.79 kg/m     Reproductive/Obstetrics negative OB ROS                             Anesthesia Physical Anesthesia Plan  ASA: II  Anesthesia Plan: General   Post-op Pain Management:    Induction:   PONV Risk Score and Plan:   Airway Management Planned:   Additional Equipment:   Intra-op Plan:   Post-operative Plan:   Informed Consent: I have reviewed the patients History and Physical, chart, labs and discussed the procedure including the risks, benefits and alternatives for the proposed anesthesia with the  patient or authorized representative who has indicated his/her understanding and acceptance.   Dental Advisory Given  Plan Discussed with: CRNA  Anesthesia Plan Comments:         Anesthesia Quick Evaluation

## 2017-11-01 NOTE — Anesthesia Post-op Follow-up Note (Signed)
Anesthesia QCDR form completed.        

## 2017-11-01 NOTE — H&P (Signed)
Outpatient short stay form Pre-procedure 11/01/2017 9:44 AM Teodoro K. Alice Reichert, M.D.  Primary Physician: Juluis Pitch, M.D.  Reason for visit:  Personal hx of colon polyps, FH colon cancer and polyps.  History of present illness:  72 y/o white female has a personal history of adenomatous polyps, multiple, in the past. She has a family history of colon cancer in 2 first-degree relatives switch her 2 brothers. She denies any abdominal pain and change in bowel habits rectal bleeding. No blood thinners.    Current Facility-Administered Medications:  .  0.9 %  sodium chloride infusion, , Intravenous, Continuous, Aneth, Benay Pike, MD, Last Rate: 20 mL/hr at 11/01/17 0934, 1,000 mL at 11/01/17 0934  Medications Prior to Admission  Medication Sig Dispense Refill Last Dose  . oxybutynin (DITROPAN) 5 MG tablet Take 5 mg by mouth 3 (three) times daily.     Marland Kitchen alendronate (FOSAMAX) 35 MG tablet TAKE 1 TABLET EVERY 7 DAYS WITH FULL GLASS OF WATER ON AN EMPTY STOMACH 12 tablet 2 Taking  . anastrozole (ARIMIDEX) 1 MG tablet Take 1 tablet (1 mg total) by mouth daily. 90 tablet 3 Taking  . cholecalciferol (VITAMIN D) 1000 UNITS tablet Take 2,000 Units by mouth daily.   Taking  . cyclobenzaprine (FLEXERIL) 5 MG tablet Take 1 tablet by mouth daily as needed.   Taking  . simvastatin (ZOCOR) 20 MG tablet Take 20 mg by mouth every evening.   Taking  . vitamin B-12 (CYANOCOBALAMIN) 1000 MCG tablet Take 1,000 mcg daily by mouth.   Taking  . [DISCONTINUED] mirabegron ER (MYRBETRIQ) 25 MG TB24 tablet Take 1 tablet (25 mg total) daily by mouth. 30 tablet 0 Taking     No Known Allergies   Past Medical History:  Diagnosis Date  . Arm fracture, left 1969  . Basal cell carcinoma    By midsternal area  . Breast cancer ([place])    left breast  . History of radiation therapy 01/25/12 - 03/09/12   left breast  . Hyperlipidemia   . Urgency of urination     Review of systems:      Physical Exam  General  appearance: alert, cooperative and appears stated age Resp: clear to auscultation bilaterally Cardio: regular rate and rhythm, S1, S2 normal, no murmur, click, rub or gallop GI: soft, non-tender; bowel sounds normal; no masses,  no organomegaly     Planned procedures: Proceed with colonoscopy.The patient understands the nature of the planned procedure, indications, risks, alternatives and potential complications including but not limited to bleeding, infection, perforation, damage to internal organs and possible oversedation/side effects from anesthesia. The patient agrees and gives consent to proceed.  Please refer to procedure notes for findings, recommendations and patient disposition/instructions.    Teodoro K. Alice Reichert, M.D. Gastroenterology 11/01/2017  9:44 AM

## 2017-11-01 NOTE — Op Note (Signed)
[facility] Patient Name: Krystal Payne Procedure Date: 11/01/2017 9:45 AM MRN: [id number] Account #: [id number] Date of Birth: 09/02/1946 Admit Type: Outpatient Age: 72 Room: [facility] ENDO ROOM 1 Gender: Female Note Status: Finalized Procedure:            Colonoscopy Indications:          Screening patient at increased risk: Family history of                        colorectal cancer in multiple 1st-degree relatives,                        High risk colon cancer surveillance: Personal history                        of colonic polyps Providers:            Teodoro K. Alice Reichert MD, MD Referring MD:         Youlanda Roys. Lovie Macadamia, MD (Referring MD) Medicines:            Propofol per Anesthesia Complications:        No immediate complications. Procedure:            Pre-Anesthesia Assessment:                       - The risks and benefits of the procedure and the                        sedation options and risks were discussed with the                        patient. All questions were answered and informed                        consent was obtained.                       - Patient identification and proposed procedure were                        verified prior to the procedure by the nurse. The                        procedure was verified in the procedure room.                       - ASA Grade Assessment: III - A patient with severe                        systemic disease.                       - After reviewing the risks and benefits, the patient                        was deemed in satisfactory condition to undergo the                        procedure.  After obtaining informed consent, the colonoscope was                        passed under direct vision. Throughout the procedure,                        the patient's blood pressure, pulse, and oxygen                        saturations were monitored continuously. The         Colonoscope was introduced through the anus and                        advanced to the the cecum, identified by appendiceal                        orifice and ileocecal valve. The colonoscopy was                        performed without difficulty. The patient tolerated the                        procedure well. The quality of the bowel preparation                        was good. The ileocecal valve, appendiceal orifice, and                        rectum were photographed. Findings:      The perianal and digital rectal examinations were normal. Pertinent       negatives include normal sphincter tone and no palpable rectal lesions.      A few small-mouthed diverticula were found in the sigmoid colon.      Two sessile polyps were found in the transverse colon. The polyps were 3       to 4 mm in size. These polyps were removed with a jumbo cold forceps.       Resection and retrieval were complete.      Two sessile polyps were found in the rectum. The polyps were 2 to 3 mm       in size. These polyps were removed with a jumbo cold forceps. Resection       and retrieval were complete. These polyps were removed with a jumbo cold       forceps. Resection and retrieval were complete.      The exam was otherwise without abnormality.      Non-bleeding internal hemorrhoids were found during retroflexion. The       hemorrhoids were small and Grade I (internal hemorrhoids that do not       prolapse). Impression:           - Diverticulosis in the sigmoid colon.                       - Two 3 to 4 mm polyps in the transverse colon, removed                        with a jumbo cold forceps. Resected and retrieved.                       -  Two 2 to 3 mm polyps in the rectum, removed with a                        jumbo cold forceps. Resected and retrieved.                       - The examination was otherwise normal. Recommendation:       - Patient has a contact number available for                         emergencies. The signs and symptoms of potential                        delayed complications were discussed with the patient.                        Return to normal activities tomorrow. Written discharge                        instructions were provided to the patient.                       - Resume previous diet.                       - Continue present medications.                       - Await pathology results.                       - Repeat colonoscopy in 3 years for surveillance based                        on pathology results.                       - Return to GI office PRN.                       - The findings and recommendations were discussed with                        the patient and their family.                       - I advised genetic screening for familial cancer                        syndrome.                       - Patient has a contact number available for                        emergencies. The signs and symptoms of potential                        delayed complications were discussed with the patient.                        Return to normal activities tomorrow. Written discharge  instructions were provided to the patient.                       - Resume previous diet.                       - Continue present medications.                       - Await pathology results.                       - Repeat colonoscopy in 3 years for surveillance based                        on pathology results.                       - Return to GI office PRN.                       - The findings and recommendations were discussed with                        the patient and their family. Procedure Code(s):    --- Professional ---                       [phone], Colonoscopy, flexible; with biopsy, single or                        multiple Diagnosis Code(s):    --- Professional ---                       Z80.0, Family history of malignant neoplasm of                         digestive organs                       Z86.010, Personal history of colonic polyps                       D12.3, Benign neoplasm of transverse colon (hepatic                        flexure or splenic flexure)                       K62.1, Rectal polyp                       K57.30, Diverticulosis of large intestine without                        perforation or abscess without bleeding CPT copyright 2016 American Medical Association. All rights reserved. The codes documented in this report are preliminary and upon coder review may  be revised to meet current compliance requirements. Efrain Sella MD, MD 11/01/2017 10:12:33 AM This report has been signed electronically. Number of Addenda: 0 Note Initiated On: 11/01/2017 9:45 AM Scope Withdrawal Time: 0 hours 9 minutes 3 seconds  Total Procedure Duration: 0 hours 14 minutes 14 seconds       [facility]

## 2017-11-01 NOTE — Anesthesia Procedure Notes (Signed)
Date/Time: 11/01/2017 9:53 AM Performed by: Doreen Salvage, CRNA Pre-anesthesia Checklist: Patient identified, Emergency Drugs available, Suction available and Patient being monitored Patient Re-evaluated:Patient Re-evaluated prior to induction Oxygen Delivery Method: Nasal cannula Induction Type: IV induction Dental Injury: Teeth and Oropharynx as per pre-operative assessment  Comments: Nasal cannula with etCO2 monitoring

## 2017-11-02 ENCOUNTER — Encounter: Payer: Self-pay | Admitting: Internal Medicine

## 2017-11-02 LAB — SURGICAL PATHOLOGY

## 2017-11-02 NOTE — Anesthesia Postprocedure Evaluation (Signed)
Anesthesia Post Note  Patient: Krystal Payne  Procedure(s) Performed: COLONOSCOPY WITH PROPOFOL (N/A )  Patient location during evaluation: PACU Anesthesia Type: General Level of consciousness: awake and alert Pain management: pain level controlled Vital Signs Assessment: post-procedure vital signs reviewed and stable Respiratory status: spontaneous breathing, nonlabored ventilation, respiratory function stable and patient connected to nasal cannula oxygen Cardiovascular status: blood pressure returned to baseline and stable Postop Assessment: no apparent nausea or vomiting Anesthetic complications: no     Last Vitals:  Vitals:   11/01/17 1030 11/01/17 1040  BP: 136/74 137/77  Pulse: 79 75  Resp: 14 16  Temp:    SpO2: 96% 99%    Last Pain:  Vitals:   11/02/17 0730  TempSrc:   PainSc: 0-No pain                 Molli Barrows

## 2017-11-09 ENCOUNTER — Ambulatory Visit: Payer: PPO | Admitting: Urology

## 2017-11-13 ENCOUNTER — Other Ambulatory Visit: Payer: Self-pay | Admitting: Oncology

## 2017-11-13 DIAGNOSIS — Z853 Personal history of malignant neoplasm of breast: Secondary | ICD-10-CM

## 2017-12-21 ENCOUNTER — Ambulatory Visit
Admission: RE | Admit: 2017-12-21 | Discharge: 2017-12-21 | Disposition: A | Discharge status: Home / Self Care | Payer: PPO | Source: Ambulatory Visit | Attending: Oncology | Admitting: Oncology

## 2017-12-21 DIAGNOSIS — R922 Inconclusive mammogram: Secondary | ICD-10-CM | POA: Diagnosis not present

## 2017-12-21 DIAGNOSIS — Z853 Personal history of malignant neoplasm of breast: Secondary | ICD-10-CM

## 2017-12-21 HISTORY — DX: Personal history of irradiation: Z92.3

## 2017-12-28 DIAGNOSIS — N390 Urinary tract infection, site not specified: Secondary | ICD-10-CM | POA: Diagnosis not present

## 2017-12-28 DIAGNOSIS — R399 Unspecified symptoms and signs involving the genitourinary system: Secondary | ICD-10-CM | POA: Diagnosis not present

## 2018-02-02 DIAGNOSIS — M8580 Other specified disorders of bone density and structure, unspecified site: Secondary | ICD-10-CM | POA: Diagnosis not present

## 2018-02-02 DIAGNOSIS — E785 Hyperlipidemia, unspecified: Secondary | ICD-10-CM | POA: Diagnosis not present

## 2018-02-05 DIAGNOSIS — M8589 Other specified disorders of bone density and structure, multiple sites: Secondary | ICD-10-CM | POA: Diagnosis not present

## 2018-02-05 DIAGNOSIS — C50412 Malignant neoplasm of upper-outer quadrant of left female breast: Secondary | ICD-10-CM | POA: Diagnosis not present

## 2018-02-05 DIAGNOSIS — Z0000 Encounter for general adult medical examination without abnormal findings: Secondary | ICD-10-CM | POA: Diagnosis not present

## 2018-02-05 DIAGNOSIS — E7800 Pure hypercholesterolemia, unspecified: Secondary | ICD-10-CM | POA: Diagnosis not present

## 2018-02-05 DIAGNOSIS — N3281 Overactive bladder: Secondary | ICD-10-CM | POA: Diagnosis not present

## 2018-02-16 ENCOUNTER — Other Ambulatory Visit: Payer: Self-pay | Admitting: Oncology

## 2018-04-23 ENCOUNTER — Other Ambulatory Visit: Payer: Self-pay

## 2018-04-23 DIAGNOSIS — C50412 Malignant neoplasm of upper-outer quadrant of left female breast: Secondary | ICD-10-CM

## 2018-04-23 DIAGNOSIS — Z170 Estrogen receptor positive status [ER+]: Principal | ICD-10-CM

## 2018-04-23 NOTE — Progress Notes (Signed)
[place]  [phone] [phone]) [phone]  OFFICE PROGRESS NOTE   ID: [name]   DOB: August 30, 1946  MR#: [id number]  OFB#:[id number]   PCP: Juluis Pitch, MD GYN:  SU: Rolm Bookbinder, M.D. RAD ONC: Rexene Edison, M.D.  CHIEF COMPLAINT:  Hx of Left Breast Cancer   CURRENT TREATMENT: Anastrozole   HISTORY OF PRESENT ILLNESS: From Dr. Collier Salina Rubin's new patient evaluation note dated 01/12/2012:  "Delightful 72 year old woman in excellent health who presents with an abnormal mammogram. Screening mammogram 12/01/2011 showed a possible mass in the left breast. Diagnostic mammogram and ultrasound performed on 12/15/2011 showed a subtle the palpable mass at 3:00 position. On ultrasound this measured 1.1 x 0.8 x 0.7 cm. A biopsy was performed on 12/15/2011 which showed invasive mammary carcinoma grade 1/2 ER +100% PR +100%, proliferative index 6% HER-2 was amplified with a ratio of 1.36 bilateral MRI exam on 12/21/2011 showed a solitary 11 x 9 x 9 mm mass is doing third of her left breast. No other abnormalities were seen in a suspicious lesion in the liver was likely a felt to be a cyst. The patient underwent a lumpectomy and sentinel lymph node evaluation on 01/02/2012. Final pathology showed a 1.1 cm grade 1/2 cancer with 1 and negative sentinel lymph node and clear surgical margins. She does have an unremarkable postoperative course. A. intraoperatively in the MammoSite catheter was placed subsequently this was removed and was realized that the distance to the skin was too close. She has been referred for external beam irradiation and it seems as for a medical oncology consultation."  Her subsequent history is as detailed below.   INTERVAL HISTORY: Krystal Payne returns today for follow-up of her estrogen receptor positive breast cancer. She continues on anastrozole, with good tolerance. She denies issues with hot flashes and vaginal dryness is not a major  issue.   Since her last visit, she underwent diagnostic bilateral mammography with CAD and tomography on 12/21/2017 at [facility] showing: breast density category C. There was no evidence of malignancy   REVIEW OF SYSTEMS: [name] reports that she has been busy in her yard and gardening over the summer. She went to a trip with her sister to the [place]. Last week, she went to the beach with her children. She visits her older sister at the nursing home. She denies unusual headaches, visual changes, nausea, vomiting, or dizziness. There has been no unusual cough, phlegm production, or pleurisy. This been no change in bowel or bladder habits. She denies unexplained fatigue or unexplained weight loss, bleeding, rash, or fever. A detailed review of systems was otherwise stable.    PAST MEDICAL HISTORY: Past Medical History:  Diagnosis Date  . Arm fracture, left 1969  . Basal cell carcinoma    By midsternal area  . Breast cancer ([place])    left breast  . History of radiation therapy 01/25/12 - 03/09/12   left breast  . Hyperlipidemia   . Personal history of radiation therapy 2013  . Urgency of urination     PAST SURGICAL HISTORY: Past Surgical History:  Procedure Laterality Date  . BREAST BIOPSY Left 12/15/2011  . BREAST LUMPECTOMY Left 01/02/2012  . COLONOSCOPY WITH PROPOFOL N/A 11/01/2017   Procedure: COLONOSCOPY WITH PROPOFOL;  Surgeon: Toledo, Benay Pike, MD;  Location: [facility] ENDOSCOPY;  Service: Gastroenterology;  Laterality: N/A;  . VAGINAL HYSTERECTOMY  1985    FAMILY HISTORY Family History  Problem Relation Age of Onset  .  Heart disease Mother   . Diabetes Mother   . Cancer Brother        colon//under treatment  . Bladder Cancer Brother   . Cancer Maternal Aunt        breast/dx 16 years ago/currently 81  . Alzheimer's disease Father   . COPD Brother   . Heart disease Brother   . Heart disease Brother   . Heart attack Brother   . Lung disease Brother   . Heart  disease Sister   . Breast cancer Other 42  . Kidney cancer Neg Hx   . Prostate cancer Neg Hx   She is one of 12 children in her family; one sibling has died of myocardial infarction. She has another brother who was diagnosed with stage IIIC colon cancer. She is a strong family history of colonic polyps and her family including herself.    GYNECOLOGIC HISTORY:  (Reviewed 04/21/2014) G2  P2, menarche 18,  menopause at time of hysterectomy in 1985 and ovaries were retained.   SOCIAL HISTORY:  (Updated 04/21/2014) The patient has been married to James "Jimmy" Hoak since 1970.  He is a retired [employer] examiner.  She retired from being a real estate paralegal in 2009.  The patient and her husband have 2 adult children, 1 son and 1 daughter that both live in [place], [place]. They have 2 grandchildren who as of 2015 are 13 and 14 years old. The patient enjoys going to the beach, antiquing, listening to music and doing pro bono patient advocate activities in her spare time.  She also participates in [employer] at the [employer] 3-4 times weekly.  ADVANCED DIRECTIVES:  The patient states she has a living will.  HEALTH MAINTENANCE:  (Updated 04/21/2014) Social History   Tobacco Use  . Smoking status: Former Smoker    Packs/day: 0.25    Years: 2.00    Pack years: 0.50    Types: Cigarettes    Last attempt to quit: 10/31/1968    Years since quitting: 49.5  . Smokeless tobacco: Never Used  Substance Use Topics  . Alcohol use: Yes    Comment: occasional beer or wine  . Drug use: No    Colonoscopy:  UTD PAP: UTD Bone density:  The patient's last bone density scan at [facility] on 03/14/2012 showed a T score of -1.8 (osteopenia). Lipid panel: Not on file   No Known Allergies  Current Outpatient Medications  Medication Sig Dispense Refill  . alendronate (FOSAMAX) 35 MG tablet Take 1 tablet by mouth every 7 days on an empty stomach with full glass of water 12 tablet 1  .  anastrozole (ARIMIDEX) 1 MG tablet Take 1 tablet by mouth daily 90 tablet 2  . cholecalciferol (VITAMIN D) 1000 UNITS tablet Take 2,000 Units by mouth daily.    . cyclobenzaprine (FLEXERIL) 5 MG tablet Take 1 tablet by mouth daily as needed.    . oxybutynin (DITROPAN) 5 MG tablet Take 5 mg by mouth 3 (three) times daily.    . simvastatin (ZOCOR) 20 MG tablet Take 20 mg by mouth every evening.    . vitamin B-12 (CYANOCOBALAMIN) 1000 MCG tablet Take 1,000 mcg daily by mouth.     No current facility-administered medications for this visit.     OBJECTIVE: Middle-aged white woman in no acute distress  Vitals:   04/24/18 1300  BP: 111/77  Pulse: 67  Resp: 18  Temp: 98.6 F (37 C)  SpO2: 99%       Body mass index is 29.52 kg/m.    ECOG:  0 Filed Weights   04/24/18 1300  Weight: 177 lb 6.4 oz (80.5 kg)   Sclerae unicteric, EOMs intact Oropharynx clear and moist No cervical or supraclavicular adenopathy Lungs no rales or rhonchi Heart regular rate and rhythm Abd soft, nontender, positive bowel sounds MSK no focal spinal tenderness, no upper extremity lymphedema Neuro: nonfocal, well oriented, appropriate affect Breasts: The right breast is unremarkable.  The left breast is status post lumpectomy and radiation.  There is no evidence of local recurrence.  Both axillae are benign.  LAB RESULTS: Lab Results  Component Value Date   WBC 6.4 04/24/2018   NEUTROABS 4.2 04/24/2018   HGB 14.1 04/24/2018   HCT 42.4 04/24/2018   MCV 92.5 04/24/2018   PLT 266 04/24/2018      Chemistry      Component Value Date/Time   NA 141 04/24/2018 1218   NA 142 04/24/2017 1020   K 5.2 (H) 04/24/2018 1218   K 4.6 04/24/2017 1020   CL 106 04/24/2018 1218   CL 106 04/01/2013 1031   CO2 26 04/24/2018 1218   CO2 26 04/24/2017 1020   BUN 14 04/24/2018 1218   BUN 15.0 04/24/2017 1020   CREATININE 0.80 04/24/2018 1218   CREATININE 0.8 04/24/2017 1020      Component Value Date/Time   CALCIUM 10.0  04/24/2018 1218   CALCIUM 9.9 04/24/2017 1020   ALKPHOS 76 04/24/2018 1218   ALKPHOS 85 04/24/2017 1020   AST 15 04/24/2018 1218   AST 14 04/24/2017 1020   ALT 21 04/24/2018 1218   ALT 21 04/24/2017 1020   BILITOT 0.4 04/24/2018 1218   BILITOT 0.43 04/24/2017 1020      STUDIES: Since her last visit, she underwent diagnostic bilateral mammography with CAD and tomography on 12/21/2017 at [facility] showing: breast density category C. There was no evidence of malignancy.    ASSESSMENT: 71 y.o. [place], [name] woman:  1.  Status post left lumpectomy and left axillary sentinel node biopsy on 01/02/2012 for a pT1c pN0,stage IA invasive ductal carcinoma, grade 2,  ER 100%, PR 100%, Ki-67 6%, HER-2/neu negative for amplification  2.  Status post radiation therapy from 01/25/2012 through 03/09/2012.  3.  The patient started anastrozole in 03/2012, the plan being to continue for a total of 5 years (until June 2018).  4.   Osteopenia: the patient's bone density scan on 03/14/2012 showed a T score of -1.8.  (a) on Fosamax.  (b) repeat bone density July 2015 showed a T score of -1.1 (improved)  PLAN: Senaya is now a little over 6 years out from definitive surgery for breast cancer with no evidence of disease recurrence.  This is very favorable.  She is tolerating anastrozole well.  The plan will be to continue 1 more year.  This means that she will stop anastrozole when she returns to see me next June  I have encouraged her to resume a more active exercise program  She knows to call for any other issues that may develop before the next visit.   Magrinat, Gustav C, MD  04/24/18 1:20 PM Medical Oncology and Hematology [facility] [address] [place], [name] [address] Tel. [phone]    Fax. [phone]  I, Arielle Pollard, am acting as scribe for Gustav C Magrinat MD.  See med     

## 2018-04-24 ENCOUNTER — Inpatient Hospital Stay: Payer: PPO

## 2018-04-24 ENCOUNTER — Telehealth: Payer: Self-pay | Admitting: Oncology

## 2018-04-24 ENCOUNTER — Inpatient Hospital Stay: Payer: PPO | Attending: Oncology | Admitting: Oncology

## 2018-04-24 VITALS — BP 111/77 | HR 67 | Temp 98.6°F | Resp 18 | Ht 65.0 in | Wt 177.4 lb

## 2018-04-24 DIAGNOSIS — C50412 Malignant neoplasm of upper-outer quadrant of left female breast: Secondary | ICD-10-CM

## 2018-04-24 DIAGNOSIS — C50812 Malignant neoplasm of overlapping sites of left female breast: Secondary | ICD-10-CM | POA: Diagnosis not present

## 2018-04-24 DIAGNOSIS — Z170 Estrogen receptor positive status [ER+]: Secondary | ICD-10-CM | POA: Diagnosis not present

## 2018-04-24 DIAGNOSIS — Z79811 Long term (current) use of aromatase inhibitors: Secondary | ICD-10-CM | POA: Diagnosis not present

## 2018-04-24 DIAGNOSIS — M8580 Other specified disorders of bone density and structure, unspecified site: Secondary | ICD-10-CM | POA: Diagnosis not present

## 2018-04-24 LAB — CBC WITH DIFFERENTIAL (CANCER CENTER ONLY)
BASOS PCT: 1 %
Basophils Absolute: 0.1 10*3/uL (ref 0.0–0.1)
EOS ABS: 0.2 10*3/uL (ref 0.0–0.5)
EOS PCT: 3 %
HCT: 42.4 % (ref 34.8–46.6)
Hemoglobin: 14.1 g/dL (ref 11.6–15.9)
LYMPHS ABS: 1.5 10*3/uL (ref 0.9–3.3)
Lymphocytes Relative: 24 %
MCH: 30.8 pg (ref 25.1–34.0)
MCHC: 33.3 g/dL (ref 31.5–36.0)
MCV: 92.5 fL (ref 79.5–101.0)
MONOS PCT: 7 %
Monocytes Absolute: 0.4 10*3/uL (ref 0.1–0.9)
Neutro Abs: 4.2 10*3/uL (ref 1.5–6.5)
Neutrophils Relative %: 65 %
PLATELETS: 266 10*3/uL (ref 145–400)
RBC: 4.59 MIL/uL (ref 3.70–5.45)
RDW: 14 % (ref 11.2–14.5)
WBC: 6.4 10*3/uL (ref 3.9–10.3)

## 2018-04-24 LAB — CMP (CANCER CENTER ONLY)
ALK PHOS: 76 U/L (ref 38–126)
ALT: 21 U/L (ref 0–44)
AST: 15 U/L (ref 15–41)
Albumin: 4.2 g/dL (ref 3.5–5.0)
Anion gap: 9 (ref 5–15)
BUN: 14 mg/dL (ref 8–23)
CHLORIDE: 106 mmol/L (ref 98–111)
CO2: 26 mmol/L (ref 22–32)
CREATININE: 0.8 mg/dL (ref 0.44–1.00)
Calcium: 10 mg/dL (ref 8.9–10.3)
Glucose, Bld: 91 mg/dL (ref 70–99)
Potassium: 5.2 mmol/L — ABNORMAL HIGH (ref 3.5–5.1)
Sodium: 141 mmol/L (ref 135–145)
Total Bilirubin: 0.4 mg/dL (ref 0.3–1.2)
Total Protein: 7.3 g/dL (ref 6.5–8.1)

## 2018-04-24 MED ORDER — ANASTROZOLE 1 MG PO TABS: 1.0000 mg | ORAL_TABLET | Freq: Every day | ORAL | 4 refills | Status: DC

## 2018-04-24 MED ORDER — ALENDRONATE SODIUM 35 MG PO TABS: ORAL_TABLET | ORAL | 1 refills | Status: DC

## 2018-04-24 NOTE — Telephone Encounter (Signed)
Per 6/25 los.  Gave patient AVS, did not need calendar.

## 2018-09-18 DIAGNOSIS — H2513 Age-related nuclear cataract, bilateral: Secondary | ICD-10-CM | POA: Diagnosis not present

## 2018-11-07 ENCOUNTER — Other Ambulatory Visit: Payer: Self-pay

## 2018-11-07 MED ORDER — ANASTROZOLE 1 MG PO TABS: 1.0000 mg | ORAL_TABLET | Freq: Every day | ORAL | 0 refills | Status: DC

## 2018-11-07 MED ORDER — ALENDRONATE SODIUM 35 MG PO TABS: ORAL_TABLET | ORAL | 0 refills | Status: DC

## 2018-12-10 DIAGNOSIS — D1800 Hemangioma unspecified site: Secondary | ICD-10-CM | POA: Diagnosis not present

## 2018-12-10 DIAGNOSIS — L812 Freckles: Secondary | ICD-10-CM | POA: Diagnosis not present

## 2018-12-10 DIAGNOSIS — C44311 Basal cell carcinoma of skin of nose: Secondary | ICD-10-CM | POA: Diagnosis not present

## 2018-12-10 DIAGNOSIS — I788 Other diseases of capillaries: Secondary | ICD-10-CM | POA: Diagnosis not present

## 2018-12-10 DIAGNOSIS — L578 Other skin changes due to chronic exposure to nonionizing radiation: Secondary | ICD-10-CM | POA: Diagnosis not present

## 2018-12-10 DIAGNOSIS — Z1283 Encounter for screening for malignant neoplasm of skin: Secondary | ICD-10-CM | POA: Diagnosis not present

## 2018-12-10 DIAGNOSIS — L821 Other seborrheic keratosis: Secondary | ICD-10-CM | POA: Diagnosis not present

## 2018-12-10 DIAGNOSIS — Z85828 Personal history of other malignant neoplasm of skin: Secondary | ICD-10-CM | POA: Diagnosis not present

## 2018-12-10 DIAGNOSIS — D229 Melanocytic nevi, unspecified: Secondary | ICD-10-CM | POA: Diagnosis not present

## 2018-12-10 DIAGNOSIS — L219 Seborrheic dermatitis, unspecified: Secondary | ICD-10-CM | POA: Diagnosis not present

## 2018-12-10 DIAGNOSIS — L739 Follicular disorder, unspecified: Secondary | ICD-10-CM | POA: Diagnosis not present

## 2018-12-10 DIAGNOSIS — D485 Neoplasm of uncertain behavior of skin: Secondary | ICD-10-CM | POA: Diagnosis not present

## 2018-12-10 DIAGNOSIS — L820 Inflamed seborrheic keratosis: Secondary | ICD-10-CM | POA: Diagnosis not present

## 2018-12-12 ENCOUNTER — Telehealth: Payer: Self-pay

## 2018-12-12 ENCOUNTER — Other Ambulatory Visit: Payer: Self-pay | Admitting: Oncology

## 2018-12-12 DIAGNOSIS — N644 Mastodynia: Secondary | ICD-10-CM

## 2018-12-12 DIAGNOSIS — C50412 Malignant neoplasm of upper-outer quadrant of left female breast: Secondary | ICD-10-CM

## 2018-12-12 DIAGNOSIS — Z1231 Encounter for screening mammogram for malignant neoplasm of breast: Secondary | ICD-10-CM

## 2018-12-12 DIAGNOSIS — Z170 Estrogen receptor positive status [ER+]: Principal | ICD-10-CM

## 2018-12-12 NOTE — Telephone Encounter (Signed)
Pt called regarding tenderness in left breast/axillary area.  Pt denies any swelling, just noticed this morning while showering.    Per Dr. Virgie Dad recommendations orders placed for diagnostic mammogram for Left Breast with ultrasound if needed.  Pt aware - will contact [facility] for appointment.  No further needs at this time.

## 2018-12-31 ENCOUNTER — Ambulatory Visit
Admission: RE | Admit: 2018-12-31 | Discharge: 2018-12-31 | Disposition: A | Discharge status: Home / Self Care | Payer: PPO | Source: Ambulatory Visit | Attending: Oncology | Admitting: Oncology

## 2018-12-31 DIAGNOSIS — C50412 Malignant neoplasm of upper-outer quadrant of left female breast: Secondary | ICD-10-CM

## 2018-12-31 DIAGNOSIS — N644 Mastodynia: Secondary | ICD-10-CM

## 2018-12-31 DIAGNOSIS — Z853 Personal history of malignant neoplasm of breast: Secondary | ICD-10-CM | POA: Diagnosis not present

## 2018-12-31 DIAGNOSIS — R922 Inconclusive mammogram: Secondary | ICD-10-CM | POA: Diagnosis not present

## 2018-12-31 DIAGNOSIS — Z170 Estrogen receptor positive status [ER+]: Principal | ICD-10-CM

## 2018-12-31 DIAGNOSIS — N6489 Other specified disorders of breast: Secondary | ICD-10-CM | POA: Diagnosis not present

## 2019-01-14 DIAGNOSIS — L988 Other specified disorders of the skin and subcutaneous tissue: Secondary | ICD-10-CM | POA: Diagnosis not present

## 2019-01-14 DIAGNOSIS — C44319 Basal cell carcinoma of skin of other parts of face: Secondary | ICD-10-CM | POA: Diagnosis not present

## 2019-01-18 DIAGNOSIS — C44319 Basal cell carcinoma of skin of other parts of face: Secondary | ICD-10-CM | POA: Diagnosis not present

## 2019-02-27 ENCOUNTER — Other Ambulatory Visit: Payer: Self-pay | Admitting: *Deleted

## 2019-02-27 MED ORDER — ALENDRONATE SODIUM 35 MG PO TABS: ORAL_TABLET | ORAL | 0 refills | Status: DC

## 2019-02-27 MED ORDER — ANASTROZOLE 1 MG PO TABS: 1.0000 mg | ORAL_TABLET | Freq: Every day | ORAL | 0 refills | Status: DC

## 2019-02-28 ENCOUNTER — Other Ambulatory Visit: Payer: Self-pay | Admitting: Oncology

## 2019-03-12 ENCOUNTER — Emergency Department
Admission: EM | Admit: 2019-03-12 | Discharge: 2019-03-12 | Disposition: A | Discharge status: Home / Self Care | Payer: PPO | Attending: Emergency Medicine | Admitting: Emergency Medicine

## 2019-03-12 ENCOUNTER — Emergency Department: Payer: PPO

## 2019-03-12 ENCOUNTER — Other Ambulatory Visit: Payer: Self-pay

## 2019-03-12 DIAGNOSIS — Z853 Personal history of malignant neoplasm of breast: Secondary | ICD-10-CM | POA: Diagnosis not present

## 2019-03-12 DIAGNOSIS — Z85828 Personal history of other malignant neoplasm of skin: Secondary | ICD-10-CM | POA: Insufficient documentation

## 2019-03-12 DIAGNOSIS — Z20828 Contact with and (suspected) exposure to other viral communicable diseases: Secondary | ICD-10-CM | POA: Diagnosis not present

## 2019-03-12 DIAGNOSIS — R079 Chest pain, unspecified: Secondary | ICD-10-CM | POA: Diagnosis not present

## 2019-03-12 DIAGNOSIS — Z87891 Personal history of nicotine dependence: Secondary | ICD-10-CM | POA: Insufficient documentation

## 2019-03-12 DIAGNOSIS — Z79899 Other long term (current) drug therapy: Secondary | ICD-10-CM | POA: Diagnosis not present

## 2019-03-12 DIAGNOSIS — R0789 Other chest pain: Secondary | ICD-10-CM | POA: Insufficient documentation

## 2019-03-12 LAB — COMPREHENSIVE METABOLIC PANEL
ALT: 18 U/L (ref 0–44)
AST: 17 U/L (ref 15–41)
Albumin: 3.9 g/dL (ref 3.5–5.0)
Alkaline Phosphatase: 70 U/L (ref 38–126)
Anion gap: 8 (ref 5–15)
BUN: 16 mg/dL (ref 8–23)
CO2: 25 mmol/L (ref 22–32)
Calcium: 9.2 mg/dL (ref 8.9–10.3)
Chloride: 107 mmol/L (ref 98–111)
Creatinine, Ser: 0.64 mg/dL (ref 0.44–1.00)
GFR calc Af Amer: >60 mL/min (ref >60)
GFR calc non Af Amer: >60 mL/min (ref >60)
Glucose, Bld: 102 mg/dL — ABNORMAL HIGH (ref 70–99)
Potassium: 4.3 mmol/L (ref 3.5–5.1)
Sodium: 140 mmol/L (ref 135–145)
Total Bilirubin: 0.6 mg/dL (ref 0.3–1.2)
Total Protein: 6.8 g/dL (ref 6.5–8.1)

## 2019-03-12 LAB — CBC
HCT: 41.6 % (ref 36.0–46.0)
Hemoglobin: 13.7 g/dL (ref 12.0–15.0)
MCH: 30.9 pg (ref 26.0–34.0)
MCHC: 32.9 g/dL (ref 30.0–36.0)
MCV: 93.9 fL (ref 80.0–100.0)
Platelets: 239 10*3/uL (ref 150–400)
RBC: 4.43 MIL/uL (ref 3.87–5.11)
RDW: 12.8 % (ref 11.5–15.5)
WBC: 6.3 10*3/uL (ref 4.0–10.5)
nRBC: 0 % (ref 0.0–0.2)

## 2019-03-12 LAB — TROPONIN I: Troponin I: <0.03 ng/mL (ref <0.03)

## 2019-03-12 NOTE — ED Notes (Signed)
Pt to toilet independently and without distress noted. No assistance needed.

## 2019-03-12 NOTE — ED Notes (Signed)
MD at bedside to review results

## 2019-03-12 NOTE — ED Provider Notes (Signed)
[facility] Emergency Department Provider Note  Time seen: 8:52 AM  I have reviewed the triage vital signs and the nursing notes.   HISTORY  Chief Complaint Chest Pain   HPI Krystal Payne is a 73 y.o. female a past medical history of breast cancer, presents to the emergency department for chest discomfort.  According to the patient over the past 3 months she has intermittently been experiencing discomfort in the center of her chest.  States that usually lasts approximately 1 hour and is relieved somewhat if she arches her back or stretches her chest.  Patient states no shortness of breath, nausea or diaphoresis.  No leg pain or swelling.  Patient denies any symptoms currently.  Did have symptoms this morning when she awoke around 6 AM however by the time she went to [facility] this morning for evaluation her chest symptoms had resolved, patient remains asymptomatic in the emergency department.   Past Medical History:  Diagnosis Date  . Arm fracture, left 1969  . Basal cell carcinoma    By midsternal area  . Breast cancer ([place])    left breast  . History of radiation therapy 01/25/12 - 03/09/12   left breast  . Hyperlipidemia   . Personal history of radiation therapy 2013  . Urgency of urination     Patient Active Problem List   Diagnosis Date Noted  . Osteopenia 04/21/2014  . Postmenopausal estrogen deficiency 04/21/2014  . Malignant neoplasm of upper-outer quadrant of left breast in female, estrogen receptor positive ([place]) 10/01/2013  . History of radiation therapy     Past Surgical History:  Procedure Laterality Date  . BREAST BIOPSY Left 12/15/2011  . BREAST LUMPECTOMY Left 01/02/2012  . COLONOSCOPY WITH PROPOFOL N/A 11/01/2017   Procedure: COLONOSCOPY WITH PROPOFOL;  Surgeon: Toledo, Benay Pike, MD;  Location: [facility] ENDOSCOPY;  Service: Gastroenterology;  Laterality: N/A;  . [place]    Prior to Admission medications    Medication Sig Start Date End Date Taking? Authorizing Provider  alendronate (FOSAMAX) 35 MG tablet Take 1 tablet by mouth every 7 days on an empty stomach with a full glass of water 02/28/19   Magrinat, Virgie Dad, MD  anastrozole (ARIMIDEX) 1 MG tablet Take 1 tablet by mouth once daily 02/28/19   Magrinat, Virgie Dad, MD  cholecalciferol (VITAMIN D) 1000 UNITS tablet Take 2,000 Units by mouth daily.    [provider]  cyclobenzaprine (FLEXERIL) 5 MG tablet Take 1 tablet by mouth daily as needed. 02/05/16   [provider]  oxybutynin (DITROPAN) 5 MG tablet Take 5 mg by mouth 3 (three) times daily.    [provider]  simvastatin (ZOCOR) 20 MG tablet Take 20 mg by mouth every evening.    [provider]  vitamin B-12 (CYANOCOBALAMIN) 1000 MCG tablet Take 1,000 mcg daily by mouth.    [provider]    No Known Allergies  Family History  Problem Relation Age of Onset  . Heart disease Mother   . Diabetes Mother   . Cancer Brother        colon//under treatment  . Bladder Cancer Brother   . Cancer Maternal Aunt        breast/dx 16 years ago/currently 26  . Alzheimer's disease Father   . COPD Brother   . Heart disease Brother   . Heart disease Brother   . Heart attack Brother   . Lung disease Brother   . Heart disease Sister   .  Breast cancer Other 42  . Kidney cancer Neg Hx   . Prostate cancer Neg Hx     Social History Social History   Tobacco Use  . Smoking status: Former Smoker    Packs/day: 0.25    Years: 2.00    Pack years: 0.50    Types: Cigarettes    Last attempt to quit: 10/31/1968    Years since quitting: 50.3  . Smokeless tobacco: Never Used  Substance Use Topics  . Alcohol use: Yes    Comment: occasional beer or wine  . Drug use: No    Review of Systems Constitutional: Negative for fever. Cardiovascular: Intermittent chest discomfort x3 months Respiratory: Negative for shortness of breath. Gastrointestinal: Negative  for abdominal pain, vomiting Musculoskeletal: Negative for musculoskeletal complaints Skin: Negative for skin complaints  Neurological: Negative for headache All other ROS negative  ____________________________________________   PHYSICAL EXAM:  VITAL SIGNS: ED Triage Vitals  Enc Vitals Group     BP 03/12/19 0830 135/85     Pulse Rate 03/12/19 0830 84     Resp 03/12/19 0830 18     Temp 03/12/19 0830 98.3 F (36.8 C)     Temp Source 03/12/19 0830 Oral     SpO2 03/12/19 0830 97 %     Weight 03/12/19 0822 178 lb (80.7 kg)     Height 03/12/19 0822 5\' 5"  (1.651 m)     Head Circumference --      Peak Flow --      Pain Score 03/12/19 0822 0     Pain Loc --      Pain Edu? --      Excl. in [place]? --    Constitutional: Alert and oriented. Well appearing and in no distress. Eyes: Normal exam ENT      Head: Normocephalic and atraumatic.      Mouth/Throat: Mucous membranes are moist. Cardiovascular: Normal rate, regular rhythm.  Respiratory: Normal respiratory effort without tachypnea nor retractions. Breath sounds are clear Gastrointestinal: Soft and nontender. No distention.   Musculoskeletal: Nontender with normal range of motion in all extremities. No lower extremity tenderness or edema. Neurologic:  Normal speech and language. No gross focal neurologic deficits  Skin:  Skin is warm, dry and intact.  Psychiatric: Mood and affect are normal.   ____________________________________________    EKG  EKG viewed and interpreted by myself shows a normal sinus rhythm 82 bpm with a narrow QRS, normal axis, normal intervals, no concerning ST changes.  ____________________________________________    RADIOLOGY  X-rays negative  ____________________________________________   INITIAL IMPRESSION / ASSESSMENT AND PLAN / ED COURSE  Pertinent labs & imaging results that were available during my care of the patient were reviewed by me and considered in my medical decision making (see  chart for details).   Patient presents emergency department for chest discomfort intermittent over the past 3 months.  Overall the patient appears well, no distress.  Denies any symptoms currently.  Differential would include ACS, angina, reflux or gastric symptoms, chest wall discomfort.  We will obtain a chest x-ray, check labs including cardiac enzymes and continue to closely monitor.  EKG is reassuring.  Patient is asymptomatic and well-appearing in the emergency department.  X-ray negative.  Labs are reassuring including a negative troponin.  Overall the patient appears well, continues to be asymptomatic in the emergency department.  As a patient symptoms have been intermittent and ongoing over the past 3 months with a negative troponin I believe the patient is  safe for discharge home with cardiology follow-up.  We will refer to cardiology.  Discussed my normal return precautions.  NATHALYA WOLANSKI was evaluated in Emergency Department on 03/12/2019 for the symptoms described in the history of present illness. She was evaluated in the context of the global COVID-19 pandemic, which necessitated consideration that the patient might be at risk for infection with the SARS-CoV-2 virus that causes COVID-19. Institutional protocols and algorithms that pertain to the evaluation of patients at risk for COVID-19 are in a state of rapid change based on information released by regulatory bodies including the CDC and federal and state organizations. These policies and algorithms were followed during the patient's care in the ED.  ____________________________________________   FINAL CLINICAL IMPRESSION(S) / ED DIAGNOSES  Chest pain   Harvest Dark, MD 03/12/19 1044

## 2019-03-12 NOTE — ED Notes (Signed)
X-ray at bedside

## 2019-03-12 NOTE — ED Triage Notes (Signed)
Pt sent from [facility] with c/o left sided chest pain intermittently since march. Denies any sx with the pain.

## 2019-04-22 DIAGNOSIS — C50412 Malignant neoplasm of upper-outer quadrant of left female breast: Secondary | ICD-10-CM | POA: Diagnosis not present

## 2019-04-22 DIAGNOSIS — N3281 Overactive bladder: Secondary | ICD-10-CM | POA: Diagnosis not present

## 2019-04-22 DIAGNOSIS — Z0000 Encounter for general adult medical examination without abnormal findings: Secondary | ICD-10-CM | POA: Diagnosis not present

## 2019-04-22 DIAGNOSIS — E7800 Pure hypercholesterolemia, unspecified: Secondary | ICD-10-CM | POA: Diagnosis not present

## 2019-04-22 DIAGNOSIS — M8589 Other specified disorders of bone density and structure, multiple sites: Secondary | ICD-10-CM | POA: Diagnosis not present

## 2019-04-24 ENCOUNTER — Other Ambulatory Visit: Payer: Self-pay

## 2019-04-24 DIAGNOSIS — Z170 Estrogen receptor positive status [ER+]: Secondary | ICD-10-CM

## 2019-04-24 DIAGNOSIS — C50412 Malignant neoplasm of upper-outer quadrant of left female breast: Secondary | ICD-10-CM

## 2019-04-24 NOTE — Progress Notes (Signed)
[facility]  [phone] [phone]  OFFICE PROGRESS NOTE   ID: Krystal Payne   DOB: 07-16-1946  MR#: [id number]  CVE#:[id number]  Patient Care Team: Juluis Pitch, MD as PCP - General (Family Medicine) Hollice Espy, MD as Consulting Physician (Urology) Clyde Zarrella, Virgie Dad, MD as Consulting Physician (Oncology) End, Harrell Gave, MD as Consulting Physician (Cardiology) OTHER MD:  CHIEF COMPLAINT:  Hx of Left Breast Cancer   CURRENT TREATMENT: Observation   INTERVAL HISTORY: Krystal Payne returns today for follow-up of her estrogen receptor positive breast cancer.   She continues on anastrozole, with good tolerance. She denies issues with hot flashes and vaginal dryness is not a major issue.   Since her last visit, she contacted our office with concerns of tenderness in the left breast/axilla area without swelling. She underwent bilateral diagnostic mammography with tomography and left breast ultrasound at [facility] on 12/31/2018 showing: breast density category C; no evidence of malignancy in either breast.  She also presented to the ED on 03/12/2019 with chest pain. Chest x-ray performed at that time showed no active disease.    REVIEW OF SYSTEMS: Krystal Payne reports she saw her PCP, [name], on 04/22/2019 following her chest x-ray. She is scheduled to meet with Dr. Saunders Revel in cardiology. [name] recommended she see her GI doctor, Dr. Alice Reichert, to assess for hiatal hernia. A detailed review of systems was otherwise entirely negative.   HISTORY OF PRESENT ILLNESS: From Dr. Collier Salina Rubin's new patient evaluation note dated 01/12/2012:  "Krystal 73 year old Payne in excellent health who presents with an abnormal mammogram. Screening mammogram 12/01/2011 showed a possible mass in the left breast. Diagnostic mammogram and ultrasound performed on 12/15/2011 showed a subtle the palpable mass at 3:00 position. On ultrasound this measured 1.1 x  0.8 x 0.7 cm.   A biopsy was performed on 12/15/2011 which showed invasive mammary carcinoma grade 1/2 ER +100% PR +100%, proliferative index 6% HER-2 was amplified with a ratio of 1.36 bilateral MRI exam on 12/21/2011 showed a solitary 11 x 9 x 9 mm mass is doing third of her left breast. No other abnormalities were seen in a suspicious lesion in the liver was likely a felt to be a cyst.   The patient underwent a lumpectomy and sentinel lymph node evaluation on 01/02/2012. Final pathology showed a 1.1 cm grade 1/2 cancer with 1 and negative sentinel lymph node and clear surgical margins. She does have an unremarkable postoperative course. A. intraoperatively in the MammoSite catheter was placed subsequently this was removed and was realized that the distance to the skin was too close. She has been referred for external beam irradiation and it seems as for a medical oncology consultation."    Her subsequent history is as detailed below.   PAST MEDICAL HISTORY: Past Medical History:  Diagnosis Date  . Arm fracture, left 1969  . Basal cell carcinoma    By midsternal area  . Breast cancer ([place])    left breast  . History of radiation therapy 01/25/12 - 03/09/12   left breast  . Hyperlipidemia   . Personal history of radiation therapy 2013  . Urgency of urination     PAST SURGICAL HISTORY: Past Surgical History:  Procedure Laterality Date  . BREAST BIOPSY Left 12/15/2011  . BREAST LUMPECTOMY Left 01/02/2012  . COLONOSCOPY WITH PROPOFOL N/A 11/01/2017   Procedure: COLONOSCOPY WITH PROPOFOL;  Surgeon: Toledo, Benay Pike, MD;  Location: [facility] ENDOSCOPY;  Service: Gastroenterology;  Laterality: N/A;  .  VAGINAL HYSTERECTOMY  1985    FAMILY HISTORY Family History  Problem Relation Age of Onset  . Heart disease Mother   . Diabetes Mother   . Cancer Brother        colon//under treatment  . Bladder Cancer Brother   . Cancer Maternal Aunt        breast/dx 16 years ago/currently 26  .  Alzheimer's disease Father   . COPD Brother   . Heart disease Brother   . Heart disease Brother   . Heart attack Brother   . Lung disease Brother   . Heart disease Sister   . Breast cancer Other 42  . Kidney cancer Neg Hx   . Prostate cancer Neg Hx   She is one of 12 children in her family; one sibling has died of myocardial infarction. She has another brother who was diagnosed with stage IIIC colon cancer. She is a strong family history of colonic polyps and her family including herself.    GYNECOLOGIC HISTORY:  (Reviewed 04/21/2014) G2  P2, menarche 62,  menopause at time of hysterectomy in 1985 and ovaries were retained.   SOCIAL HISTORY:  (Updated 04/21/2014) The patient has been married to [employer]" Akey since 1970.  He is a retired Systems developer.  She retired from being a Administrator, arts in 2009.  The patient and her husband have 2 adult children, 1 son and 1 daughter that both live in [place], [place]. They have 2 grandchildren who as of 11 are 69 and 32 years old. The patient enjoys going to the beach, antiquing, listening to music and doing pro bono patient advocate activities in her spare time.  She also participates in [employer] at the [facility] 3-4 times weekly.   ADVANCED DIRECTIVES:  The patient states she has a living will.   HEALTH MAINTENANCE:  (Updated 04/21/2014) Social History   Tobacco Use  . Smoking status: Former Smoker    Packs/day: 0.25    Years: 2.00    Pack years: 0.50    Types: Cigarettes    Quit date: 10/31/1968    Years since quitting: 50.5  . Smokeless tobacco: Never Used  Substance Use Topics  . Alcohol use: Yes    Comment: occasional beer or wine  . Drug use: No     Colonoscopy:  11/01/2017, Dr. Alice Reichert, benign polyps x4  PAP: UTD  Bone density: 04/2016, -1.2 (osteopenia, [facility])  Lipid panel: Not on file   No Known Allergies  Current Outpatient Medications  Medication Sig Dispense Refill  .  alendronate (FOSAMAX) 35 MG tablet Take 1 tablet by mouth every 7 days on an empty stomach with a full glass of water 12 tablet 0  . anastrozole (ARIMIDEX) 1 MG tablet Take 1 tablet by mouth once daily 90 tablet 0  . cyclobenzaprine (FLEXERIL) 5 MG tablet Take 1 tablet by mouth daily as needed.    Marland Kitchen oxybutynin (DITROPAN) 5 MG tablet Take 5 mg by mouth daily.     . simvastatin (ZOCOR) 20 MG tablet Take 20 mg by mouth every evening.     No current facility-administered medications for this visit.     OBJECTIVE: Middle-aged white Payne who appears well  Vitals:   04/25/19 1108  BP: 118/67  Pulse: 79  Resp: 18  Temp: 99.1 F (37.3 C)  SpO2: 99%     Body mass index is 29.07 kg/m.    ECOG:  1 Filed Weights   04/25/19  1108  Weight: 174 lb 11.2 oz (79.2 kg)   Sclerae unicteric, pupils round and equal Wearing a mask No cervical or supraclavicular adenopathy Lungs no rales or rhonchi Heart regular rate and rhythm Abd soft, nontender, positive bowel sounds MSK no focal spinal tenderness, no upper extremity lymphedema Neuro: nonfocal, well oriented, appropriate affect Breasts: The right breast is benign.  The left breast is status post lumpectomy followed by radiation.  There is no evidence of disease recurrence.  Both axillae are benign.  LAB RESULTS: Lab Results  Component Value Date   WBC 6.3 04/25/2019   NEUTROABS 4.0 04/25/2019   HGB 13.9 04/25/2019   HCT 42.8 04/25/2019   MCV 94.1 04/25/2019   PLT 284 04/25/2019      Chemistry      Component Value Date/Time   NA 141 04/25/2019 1046   NA 142 04/24/2017 1020   K 4.1 04/25/2019 1046   K 4.6 04/24/2017 1020   CL 107 04/25/2019 1046   CL 106 04/01/2013 1031   CO2 26 04/25/2019 1046   CO2 26 04/24/2017 1020   BUN 21 04/25/2019 1046   BUN 15.0 04/24/2017 1020   CREATININE 0.84 04/25/2019 1046   CREATININE 0.8 04/24/2017 1020      Component Value Date/Time   CALCIUM 9.4 04/25/2019 1046   CALCIUM 9.9 04/24/2017 1020    ALKPHOS 75 04/25/2019 1046   ALKPHOS 85 04/24/2017 1020   AST 13 (L) 04/25/2019 1046   AST 14 04/24/2017 1020   ALT 12 04/25/2019 1046   ALT 21 04/24/2017 1020   BILITOT 0.4 04/25/2019 1046   BILITOT 0.43 04/24/2017 1020      STUDIES: Mammography results discussed with the patient  ASSESSMENT: 73 y.o. Krystal Payne, Krystal Payne:  1.  Status post left lumpectomy and left axillary sentinel node biopsy on 01/02/2012 for a pT1c pN0,stage IA invasive ductal carcinoma, grade 2,  ER 100%, PR 100%, Ki-67 6%, HER-2/neu negative for amplification  2.  Status post radiation therapy from 01/25/2012 through 03/09/2012.  3.  The patient started anastrozole in 03/2012, completing 7 years June 2020  4.   Osteopenia: the patient's bone density scan on 03/14/2012 showed a T score of -1.8.  (a) on Fosamax.  (b) repeat bone density July 2015 showed a T score of -1.1 (improved)  (c) bone density 05/30/2016 showed a T score of -1.2 (stable).  PLAN: Alannie is now a little over 7 years out from definitive surgery for her breast cancer with no evidence of disease recurrence.  This is very favorable.  We have data that 7 years of anastrozole is as good as 10 and therefore she has obtained the maximum effect from this medication.  She is coming off anastrozole at this point.  She may or may not wish to continue the alendronate.  She will discuss that with her primary care physician.  The last bone density I have from her shows minimal osteopenia so I would be comfortable with her stopping.  At this point I am comfortable releasing her to her primary care doctor.  All she will need in terms of breast cancer follow-up is yearly mammography preferably with tomography and a yearly physician breast exam  We will be glad to see Yeny again at any point in the future if and when the need arises but as of now are making no further routine appointments for her here. Khaniya Tenaglia, Virgie Dad, MD  04/25/19  11:46 AM Medical Oncology and Hematology [facility] [address] [place], [place] [address] Tel. [phone]    Fax. [phone]   I, Katie Daubenspeck, am acting as scribe for Dr. Kieren Adkison C. Shakerra Red.  I, Suhailah Kwan MD, have reviewed the above documentation for accuracy and completeness, and I agree with the above.    

## 2019-04-25 ENCOUNTER — Inpatient Hospital Stay: Payer: PPO | Attending: Oncology

## 2019-04-25 ENCOUNTER — Inpatient Hospital Stay (HOSPITAL_BASED_OUTPATIENT_CLINIC_OR_DEPARTMENT_OTHER): Payer: PPO | Admitting: Oncology

## 2019-04-25 ENCOUNTER — Other Ambulatory Visit: Payer: Self-pay

## 2019-04-25 VITALS — BP 118/67 | HR 79 | Temp 99.1°F | Resp 18 | Ht 65.0 in | Wt 174.7 lb

## 2019-04-25 DIAGNOSIS — Z79811 Long term (current) use of aromatase inhibitors: Secondary | ICD-10-CM | POA: Insufficient documentation

## 2019-04-25 DIAGNOSIS — E785 Hyperlipidemia, unspecified: Secondary | ICD-10-CM | POA: Diagnosis not present

## 2019-04-25 DIAGNOSIS — Z170 Estrogen receptor positive status [ER+]: Secondary | ICD-10-CM | POA: Diagnosis not present

## 2019-04-25 DIAGNOSIS — M8580 Other specified disorders of bone density and structure, unspecified site: Secondary | ICD-10-CM

## 2019-04-25 DIAGNOSIS — C50812 Malignant neoplasm of overlapping sites of left female breast: Secondary | ICD-10-CM

## 2019-04-25 DIAGNOSIS — C50412 Malignant neoplasm of upper-outer quadrant of left female breast: Secondary | ICD-10-CM

## 2019-04-25 LAB — CMP (CANCER CENTER ONLY)
ALT: 12 U/L (ref 0–44)
AST: 13 U/L — ABNORMAL LOW (ref 15–41)
Albumin: 4.1 g/dL (ref 3.5–5.0)
Alkaline Phosphatase: 75 U/L (ref 38–126)
Anion gap: 8 (ref 5–15)
BUN: 21 mg/dL (ref 8–23)
CO2: 26 mmol/L (ref 22–32)
Calcium: 9.4 mg/dL (ref 8.9–10.3)
Chloride: 107 mmol/L (ref 98–111)
Creatinine: 0.84 mg/dL (ref 0.44–1.00)
GFR, Est AFR Am: >60 mL/min (ref >60)
GFR, Estimated: >60 mL/min (ref >60)
Glucose, Bld: 95 mg/dL (ref 70–99)
Potassium: 4.1 mmol/L (ref 3.5–5.1)
Sodium: 141 mmol/L (ref 135–145)
Total Bilirubin: 0.4 mg/dL (ref 0.3–1.2)
Total Protein: 7.4 g/dL (ref 6.5–8.1)

## 2019-04-25 LAB — CBC WITH DIFFERENTIAL (CANCER CENTER ONLY)
Abs Immature Granulocytes: 0.04 10*3/uL (ref 0.00–0.07)
Basophils Absolute: 0.1 10*3/uL (ref 0.0–0.1)
Basophils Relative: 1 %
Eosinophils Absolute: 0.2 10*3/uL (ref 0.0–0.5)
Eosinophils Relative: 4 %
HCT: 42.8 % (ref 36.0–46.0)
Hemoglobin: 13.9 g/dL (ref 12.0–15.0)
Immature Granulocytes: 1 %
Lymphocytes Relative: 25 %
Lymphs Abs: 1.5 10*3/uL (ref 0.7–4.0)
MCH: 30.5 pg (ref 26.0–34.0)
MCHC: 32.5 g/dL (ref 30.0–36.0)
MCV: 94.1 fL (ref 80.0–100.0)
Monocytes Absolute: 0.4 10*3/uL (ref 0.1–1.0)
Monocytes Relative: 6 %
Neutro Abs: 4 10*3/uL (ref 1.7–7.7)
Neutrophils Relative %: 63 %
Platelet Count: 284 10*3/uL (ref 150–400)
RBC: 4.55 MIL/uL (ref 3.87–5.11)
RDW: 12.3 % (ref 11.5–15.5)
WBC Count: 6.3 10*3/uL (ref 4.0–10.5)
nRBC: 0 % (ref 0.0–0.2)

## 2019-04-25 LAB — VITAMIN B12: Vitamin B-12: 416 pg/mL (ref 180–914)

## 2019-04-25 LAB — LIPID PANEL
Cholesterol: 204 mg/dL — ABNORMAL HIGH (ref 0–200)
HDL: 59 mg/dL (ref >40)
LDL Cholesterol: 127 mg/dL — ABNORMAL HIGH (ref 0–99)
Total CHOL/HDL Ratio: 3.5 RATIO
Triglycerides: 88 mg/dL (ref <150)
VLDL: 18 mg/dL (ref 0–40)

## 2019-04-29 ENCOUNTER — Encounter: Payer: Self-pay | Admitting: Oncology

## 2019-04-29 NOTE — Progress Notes (Signed)
New Outpatient Visit Date: 05/01/2019  Referring Provider: Harvest Dark, MD [facility] Emergency Department  Chief Complaint: Chest pain  HPI:  Krystal Payne is a 73 y.o. female who is being seen today for the evaluation of chest pain at the request of Dr. Kerman Passey. She has a history of left breast cancer status post lumpectomy and radiation as well as hyperlipidemia.  She was seen in the [facility] ED on 03/12/2019 with chest pain that had been occurring intermittently over the preceding 3 months.  EKG did not show any significant abnormalities.  Troponin was negative x 1.  Krystal Payne reports that she has had at least 4-5 episodes of chest pain beginning on 01/09/2019.  The episodes always occur when she is sitting or lying down.  She describes a substernal pressure radiating around the right side of her chest towards her back, usually lasting around 1.5 hours.  She notes one episode lasted about 4 hours before improving.  The pains maximal intensity is 9/10 and is without associated symptoms.  She will sometimes try to stretch her chest and back with gradual improvement in the discomfort.  In the ER, she was told that her symptoms may reflect a hiatal hernia.  She has been using an over-the-counter antacid but is unsure if it has been providing any relief.  She is able to exert herself without any chest pain or dyspnea.  Ms. Gaskins denies a history of prior heart disease.  She has experienced intermittent palpitations off and on for years, though not associated with the aforementioned chest pain.  She has noted her heart rate to be up to 130 bpm on her phone during these episodes.  Typically, she experiences this less than once a month.  She is concerned because her family has a strong history of heart disease, including coronary disease and arrhythmias.  She has not undergone prior cardiac testing.  She denies orthopnea, PND, edema, and lightheadedness.   --------------------------------------------------------------------------------------------------  Cardiovascular History & Procedures: Cardiovascular Problems:  Chest pain  Palpitations  Risk Factors:  Hyperlipidemia, family history, and age > 86  Cath/PCI:  None  CV Surgery:  None  EP Procedures and Devices:  None  Non-Invasive Evaluation(s):  None  Recent CV Pertinent Labs: Lab Results  Component Value Date   CHOL 204 (H) 04/25/2019   HDL 59 04/25/2019   LDLCALC 127 (H) 04/25/2019   TRIG 88 04/25/2019   CHOLHDL 3.5 04/25/2019   K 4.1 04/25/2019   K 4.6 04/24/2017   BUN 21 04/25/2019   BUN 15.0 04/24/2017   CREATININE 0.84 04/25/2019   CREATININE 0.8 04/24/2017    --------------------------------------------------------------------------------------------------  Past Medical History:  Diagnosis Date  . Arm fracture, left 1969  . Basal cell carcinoma    By midsternal area  . Breast cancer ([place])    left breast  . History of radiation therapy 01/25/12 - 03/09/12   left breast  . Hyperlipidemia   . Personal history of radiation therapy 2013  . Urgency of urination     Past Surgical History:  Procedure Laterality Date  . BREAST BIOPSY Left 12/15/2011  . BREAST LUMPECTOMY Left 01/02/2012  . COLONOSCOPY WITH PROPOFOL N/A 11/01/2017   Procedure: COLONOSCOPY WITH PROPOFOL;  Surgeon: Toledo, Benay Pike, MD;  Location: [facility] ENDOSCOPY;  Service: Gastroenterology;  Laterality: N/A;  . VAGINAL HYSTERECTOMY  1985    Current Meds  Medication Sig  . cyclobenzaprine (FLEXERIL) 5 MG tablet Take 1 tablet by mouth daily as needed.  Marland Kitchen oxybutynin (DITROPAN)  5 MG tablet Take 5 mg by mouth daily.  . simvastatin (ZOCOR) 20 MG tablet Take 20 mg by mouth every evening.    Allergies: Patient has no known allergies.  Social History   Tobacco Use  . Smoking status: Former Smoker    Packs/day: 0.25    Years: 2.00    Pack years: 0.50    Types: Cigarettes    Quit  date: 10/31/1968    Years since quitting: 50.5  . Smokeless tobacco: Never Used  Substance Use Topics  . Alcohol use: Yes    Alcohol/week: 1.0 standard drinks    Types: 1 Glasses of wine per week  . Drug use: No    Family History  Problem Relation Age of Onset  . Heart disease Mother   . Diabetes Mother   . Cancer Brother        colon//under treatment  . Bladder Cancer Brother   . Cancer Maternal Aunt        breast/dx 16 years ago/currently 54  . Alzheimer's disease Father   . COPD Brother   . Heart disease Brother   . Heart disease Brother   . Heart attack Brother   . Lung disease Brother   . Heart disease Sister   . Breast cancer Other 42  . Kidney cancer Neg Hx   . Prostate cancer Neg Hx     Review of Systems: A 12-system review of systems was performed and was negative except as noted in the HPI.  --------------------------------------------------------------------------------------------------  Physical Exam: BP 120/78 (BP Location: Left Arm, Patient Position: Sitting, Cuff Size: Normal)   Pulse 81   Ht 5\' 6"  (1.676 m)   Wt 178 lb 4 oz (80.9 kg)   BMI 28.77 kg/m   General: NAD. HEENT: No conjunctival pallor or scleral icterus. Moist mucous membranes. OP clear. Neck: Supple without lymphadenopathy, thyromegaly, JVD, or HJR. No carotid bruit. Lungs: Normal work of breathing. Clear to auscultation bilaterally without wheezes or crackles. Heart: Regular rate and rhythm without murmurs, rubs, or gallops. Non-displaced PMI. Abd: Bowel sounds present. Soft, NT/ND without hepatosplenomegaly Ext: No lower extremity edema. Radial, PT, and DP pulses are 2+ bilaterally Skin: Warm and dry without rash. Neuro: CNIII-XII intact. Strength and fine-touch sensation intact in upper and lower extremities bilaterally. Psych: Normal mood and affect.  EKG: Normal sinus rhythm without significant abnormality.  Lab Results  Component Value Date   WBC 6.3 04/25/2019   HGB 13.9  04/25/2019   HCT 42.8 04/25/2019   MCV 94.1 04/25/2019   PLT 284 04/25/2019    Lab Results  Component Value Date   NA 141 04/25/2019   K 4.1 04/25/2019   CL 107 04/25/2019   CO2 26 04/25/2019   BUN 21 04/25/2019   CREATININE 0.84 04/25/2019   GLUCOSE 95 04/25/2019   ALT 12 04/25/2019    Lab Results  Component Value Date   CHOL 204 (H) 04/25/2019   HDL 59 04/25/2019   LDLCALC 127 (H) 04/25/2019   TRIG 88 04/25/2019   CHOLHDL 3.5 04/25/2019     --------------------------------------------------------------------------------------------------  ASSESSMENT AND PLAN: Chest pain: Pain is atypical, given that it almost always occurs at rest and radiates to the right side.  I favor this representing GI or musculoskeletal etiology.  However, given cardiac risk factors (hyperlipidemia, age, and family history), we have agreed to perform a pharmacologic myocardial perfusion stress test for further evaluation.  Pending the stress test, I have recommended that Ms. Trentham begin taking aspirin  81 mg daily.  If stress test is unrevealing, I will defer further work-up to Ms. Marsteller's PCP.  Palpitations: Infrequent and without associated symptoms.  EKG today is normal.  We will defer additional testing at this time.  Hyperlipidemia: Most recent LDL last month was 127, which is mildly elevated.  For now, it is reasonable to continue simvastatin 20 mg daily.  However, if there is evidence of coronary or aortic atherosclerotic disease on stress test (and attenuation correction CT), escalation of statin therapy will need to be considered.  Follow-up: To be determined based on results of stress test.  Nelva Bush, MD 05/01/2019 9:56 AM

## 2019-05-01 ENCOUNTER — Other Ambulatory Visit: Payer: Self-pay

## 2019-05-01 ENCOUNTER — Encounter: Payer: Self-pay | Admitting: Internal Medicine

## 2019-05-01 ENCOUNTER — Ambulatory Visit (INDEPENDENT_AMBULATORY_CARE_PROVIDER_SITE_OTHER): Payer: PPO | Admitting: Internal Medicine

## 2019-05-01 VITALS — BP 120/78 | HR 81 | Ht 66.0 in | Wt 178.2 lb

## 2019-05-01 DIAGNOSIS — R079 Chest pain, unspecified: Secondary | ICD-10-CM | POA: Diagnosis not present

## 2019-05-01 DIAGNOSIS — R002 Palpitations: Secondary | ICD-10-CM

## 2019-05-01 DIAGNOSIS — E785 Hyperlipidemia, unspecified: Secondary | ICD-10-CM | POA: Diagnosis not present

## 2019-05-01 MED ORDER — ASPIRIN EC 81 MG PO TBEC: 81.0000 mg | DELAYED_RELEASE_TABLET | Freq: Every day | ORAL | 3 refills | Status: AC

## 2019-05-01 NOTE — Patient Instructions (Addendum)
Medication Instructions:  Your physician has recommended you make the following change in your medication:  1- START Aspirin 81 mg by mouth once a day.  If you need a refill on your cardiac medications before your next appointment, please call your pharmacy.   Lab work: - None ordered.  If you have labs (blood work) drawn today and your tests are completely normal, you will receive your results only by: Marland Kitchen MyChart Message (if you have MyChart) OR . A paper copy in the mail If you have any lab test that is abnormal or we need to change your treatment, we will call you to review the results.  Testing/Procedures: Your physician has requested that you have a lexiscan myoview. For further information please visit [email or web address]. Please follow instruction sheet, as given.   [place]  Your caregiver has ordered a Stress Test with nuclear imaging. The purpose of this test is to evaluate the blood supply to your heart muscle. This procedure is referred to as a "Non-Invasive Stress Test." This is because other than having an IV started in your vein, nothing is inserted or "invades" your body. Cardiac stress tests are done to find areas of poor blood flow to the heart by determining the extent of coronary artery disease (CAD). Some patients exercise on a treadmill, which naturally increases the blood flow to your heart, while others who are  unable to walk on a treadmill due to physical limitations have a pharmacologic/chemical stress agent called Lexiscan . This medicine will mimic walking on a treadmill by temporarily increasing your coronary blood flow.   Please note: these test may take anywhere between 2-4 hours to complete  PLEASE REPORT TO [facility] AT THE FIRST DESK WILL DIRECT YOU WHERE TO GO  Date of Procedure:_____________________________________  Arrival Time for Procedure:______________________________   PLEASE NOTIFY THE OFFICE AT LEAST 24  HOURS IN ADVANCE IF YOU ARE UNABLE TO KEEP YOUR APPOINTMENT.  [phone] AND  PLEASE NOTIFY NUCLEAR MEDICINE AT [facility] AT LEAST 24 HOURS IN ADVANCE IF YOU ARE UNABLE TO KEEP YOUR APPOINTMENT. [phone]  How to prepare for your Myoview test:  1. Do not eat or drink after midnight 2. No caffeine for 24 hours prior to test 3. No smoking 24 hours prior to test. 4. Your medication may be taken with water.  If your doctor stopped a medication because of this test, do not take that medication. 5. Ladies, please do not wear dresses.  Skirts or pants are appropriate. Please wear a short sleeve shirt. 6. No perfume, cologne or lotion. 7. Wear comfortable walking shoes. No heels!    Follow-Up: At [facility], you and your health needs are our priority.  As part of our continuing mission to provide you with exceptional heart care, we have created designated Provider Care Teams.  These Care Teams include your primary Cardiologist (physician) and Advanced Practice Providers (APPs -  Physician Assistants and Nurse Practitioners) who all work together to provide you with the care you need, when you need it. You will need a follow up appointment in AS NEEDED PENDING RESULTS.  You may see DR Harrell Gave END or one of the following Advanced Practice Providers on your designated Care Team:   Murray Hodgkins, NP Christell Faith, PA-C . Marrianne Mood, PA-C     Cardiac Nuclear Scan A cardiac nuclear scan is a test that measures blood flow to the heart when a person is resting and when he or she  is exercising. The test looks for problems such as:  Not enough blood reaching a portion of the heart.  The heart muscle not working normally. You may need this test if:  You have heart disease.  You have had abnormal lab results.  You have had heart surgery or a balloon procedure to open up blocked arteries (angioplasty).  You have chest pain.  You have shortness of breath. In this test, a  radioactive dye (tracer) is injected into your bloodstream. After the tracer has traveled to your heart, an imaging device is used to measure how much of the tracer is absorbed by or distributed to various areas of your heart. This procedure is usually done at a hospital and takes 2-4 hours. Tell a health care provider about:  Any allergies you have.  All medicines you are taking, including vitamins, herbs, eye drops, creams, and over-the-counter medicines.  Any problems you or family members have had with anesthetic medicines.  Any blood disorders you have.  Any surgeries you have had.  Any medical conditions you have.  Whether you are pregnant or may be pregnant. What are the risks? Generally, this is a safe procedure. However, problems may occur, including:  Serious chest pain and heart attack. This is only a risk if the stress portion of the test is done.  Rapid heartbeat.  Sensation of warmth in your chest. This usually passes quickly.  Allergic reaction to the tracer. What happens before the procedure?  Ask your health care provider about changing or stopping your regular medicines. This is especially important if you are taking diabetes medicines or blood thinners.  Follow instructions from your health care provider about eating or drinking restrictions.  Remove your jewelry on the day of the procedure. What happens during the procedure?  An IV will be inserted into one of your veins.  Your health care provider will inject a small amount of radioactive tracer through the IV.  You will wait for 20-40 minutes while the tracer travels through your bloodstream.  Your heart activity will be monitored with an electrocardiogram (ECG).  You will lie down on an exam table.  Images of your heart will be taken for about 15-20 minutes.  You may also have a stress test. For this test, one of the following may be done: ? You will exercise on a treadmill or stationary bike.  While you exercise, your heart's activity will be monitored with an ECG, and your blood pressure will be checked. ? You will be given medicines that will increase blood flow to parts of your heart. This is done if you are unable to exercise.  When blood flow to your heart has peaked, a tracer will again be injected through the IV.  After 20-40 minutes, you will get back on the exam table and have more images taken of your heart.  Depending on the type of tracer used, scans may need to be repeated 3-4 hours later.  Your IV line will be removed when the procedure is over. The procedure may vary among health care providers and hospitals. What happens after the procedure?  Unless your health care provider tells you otherwise, you may return to your normal schedule, including diet, activities, and medicines.  Unless your health care provider tells you otherwise, you may increase your fluid intake. This will help to flush the contrast dye from your body. Drink enough fluid to keep your urine pale yellow.  Ask your health care provider, or the department  that is doing the test: ? When will my results be ready? ? How will I get my results? Summary  A cardiac nuclear scan measures the blood flow to the heart when a person is resting and when he or she is exercising.  Tell your health care provider if you are pregnant.  Before the procedure, ask your health care provider about changing or stopping your regular medicines. This is especially important if you are taking diabetes medicines or blood thinners.  After the procedure, unless your health care provider tells you otherwise, increase your fluid intake. This will help flush the contrast dye from your body.  After the procedure, unless your health care provider tells you otherwise, you may return to your normal schedule, including diet, activities, and medicines. This information is not intended to replace advice given to you by your health  care provider. Make sure you discuss any questions you have with your health care provider. Document Released: 11/11/2004 Document Revised: 04/02/2018 Document Reviewed: 04/02/2018 Elsevier Patient Education  2020 [employer].

## 2019-05-06 ENCOUNTER — Ambulatory Visit
Admission: RE | Admit: 2019-05-06 | Discharge: 2019-05-06 | Disposition: A | Discharge status: Home / Self Care | Payer: PPO | Source: Ambulatory Visit | Attending: Internal Medicine | Admitting: Internal Medicine

## 2019-05-06 ENCOUNTER — Other Ambulatory Visit: Payer: Self-pay

## 2019-05-06 DIAGNOSIS — R079 Chest pain, unspecified: Secondary | ICD-10-CM | POA: Insufficient documentation

## 2019-05-06 MED ORDER — TECHNETIUM TC 99M TETROFOSMIN IV KIT
10.0000 | PACK | Freq: Once | INTRAVENOUS | Status: AC | PRN
  Administered 2019-05-06: 10.241 via INTRAVENOUS

## 2019-05-06 MED ORDER — REGADENOSON 0.4 MG/5ML IV SOLN
0.4000 mg | Freq: Once | INTRAVENOUS | Status: AC
  Administered 2019-05-06: 0.4 mg via INTRAVENOUS
  Filled 2019-05-06: qty 5

## 2019-05-06 MED ORDER — TECHNETIUM TC 99M TETROFOSMIN IV KIT
30.0000 | PACK | Freq: Once | INTRAVENOUS | Status: AC | PRN
  Administered 2019-05-06: 11:00:00 32.388 via INTRAVENOUS

## 2019-05-08 ENCOUNTER — Other Ambulatory Visit: Payer: Self-pay

## 2019-05-08 LAB — NM MYOCAR MULTI W/SPECT W/WALL MOTION / EF
LV dias vol: 90 mL (ref 46–106)
LV sys vol: 26 mL
Peak HR: 112 {beats}/min
Percent HR: 75 %
Rest HR: 68 {beats}/min
TID: 1

## 2019-05-10 ENCOUNTER — Telehealth: Payer: Self-pay

## 2019-05-10 DIAGNOSIS — E785 Hyperlipidemia, unspecified: Secondary | ICD-10-CM

## 2019-05-10 MED ORDER — SIMVASTATIN 40 MG PO TABS: 40.0000 mg | ORAL_TABLET | Freq: Every evening | ORAL | 1 refills | Status: DC

## 2019-05-10 NOTE — Telephone Encounter (Signed)
-----   Message from Nelva Bush, MD sent at 05/09/2019  8:58 AM EDT ----- Results d/w patient by phone.  We will continue with medical therapy.  Please have Ms. Baby increase simvastatin to 40 mg daily, with repeat lipid panel and ALT in ~3 months.  We should have her f/u with me or an APP in the office in ~3 months.  She should discuss incidental finding of gallstone and hepatic cysts with her PCP/gastroenterologist.

## 2019-05-10 NOTE — Telephone Encounter (Signed)
-----   Message from Nelva Bush, MD sent at 05/09/2019  8:58 AM EDT ----- Results d/w patient by phone.  We will continue with medical therapy.  Please have Ms. Diguglielmo increase simvastatin to 40 mg daily, with repeat lipid panel and ALT in ~3 months.  We should have her f/u with me or an APP in the office in ~3 months.  She should discuss incidental finding of gallstone and hepatic cysts with her PCP/gastroenterologist.

## 2019-05-10 NOTE — Telephone Encounter (Signed)
Spoke with the pt. Rx sent to he pt pharmacy for the increased simvastatin 40mg  daily. Adv the pt that she can have her fasting lipid and lft in the office when she is seen in 3 mo. Adv the pt that the labs are fasting so she should rqst a morning appt. Pt verbalized understanding and voiced appreciation for the call.  Message fwd to scheduling for 3 mo f/u.

## 2019-05-28 DIAGNOSIS — M810 Age-related osteoporosis without current pathological fracture: Secondary | ICD-10-CM | POA: Diagnosis not present

## 2019-07-22 DIAGNOSIS — L988 Other specified disorders of the skin and subcutaneous tissue: Secondary | ICD-10-CM | POA: Diagnosis not present

## 2019-07-22 DIAGNOSIS — D225 Melanocytic nevi of trunk: Secondary | ICD-10-CM | POA: Diagnosis not present

## 2019-07-22 DIAGNOSIS — L219 Seborrheic dermatitis, unspecified: Secondary | ICD-10-CM | POA: Diagnosis not present

## 2019-07-22 DIAGNOSIS — L814 Other melanin hyperpigmentation: Secondary | ICD-10-CM | POA: Diagnosis not present

## 2019-07-22 DIAGNOSIS — L820 Inflamed seborrheic keratosis: Secondary | ICD-10-CM | POA: Diagnosis not present

## 2019-07-22 DIAGNOSIS — L738 Other specified follicular disorders: Secondary | ICD-10-CM | POA: Diagnosis not present

## 2019-07-22 DIAGNOSIS — L570 Actinic keratosis: Secondary | ICD-10-CM | POA: Diagnosis not present

## 2019-07-22 DIAGNOSIS — Z1283 Encounter for screening for malignant neoplasm of skin: Secondary | ICD-10-CM | POA: Diagnosis not present

## 2019-07-22 DIAGNOSIS — L578 Other skin changes due to chronic exposure to nonionizing radiation: Secondary | ICD-10-CM | POA: Diagnosis not present

## 2019-07-22 DIAGNOSIS — D1800 Hemangioma unspecified site: Secondary | ICD-10-CM | POA: Diagnosis not present

## 2019-07-22 DIAGNOSIS — L821 Other seborrheic keratosis: Secondary | ICD-10-CM | POA: Diagnosis not present

## 2019-07-22 DIAGNOSIS — Z85828 Personal history of other malignant neoplasm of skin: Secondary | ICD-10-CM | POA: Diagnosis not present

## 2019-07-24 DIAGNOSIS — L820 Inflamed seborrheic keratosis: Secondary | ICD-10-CM | POA: Diagnosis not present

## 2019-07-24 DIAGNOSIS — Z1283 Encounter for screening for malignant neoplasm of skin: Secondary | ICD-10-CM | POA: Diagnosis not present

## 2019-07-24 DIAGNOSIS — L738 Other specified follicular disorders: Secondary | ICD-10-CM | POA: Diagnosis not present

## 2019-07-24 DIAGNOSIS — L578 Other skin changes due to chronic exposure to nonionizing radiation: Secondary | ICD-10-CM | POA: Diagnosis not present

## 2019-07-24 DIAGNOSIS — L570 Actinic keratosis: Secondary | ICD-10-CM | POA: Diagnosis not present

## 2019-07-24 DIAGNOSIS — L821 Other seborrheic keratosis: Secondary | ICD-10-CM | POA: Diagnosis not present

## 2019-08-16 NOTE — Progress Notes (Signed)
Follow-up Outpatient Visit Date: 08/21/2019  Primary Care Provider: Juluis Pitch, MD [address] [address]  Chief Complaint: Follow-up chest pain  HPI:  [name] is a 73 y.o. year-old female with history of  left breast cancer status post lumpectomy and radiation as well as hyperlipidemia, who presents for follow-up of chest pain.  I met her in July following ED visit for chest pain in May.  We agreed to obtain a pharmacologic myocardial perfusion stress test, which was low risk with small in size, mild in severity, reversible defect in the apical inferior and lateral segments.  We agreed to pursue medical therapy rather than cardiac catheterization.  Today, [name] reports that she feels well.  She has not had any further episodes of chest pain.  She also denies shortness of breath, lightheadedness, and edema.  She has rare palpitations, that has been longstanding and stable.  Following her lipid panel in June, she was advised to increase simvastatin to 40 mg daily.  However, she subsequently developed myalgias as well as hair loss.  She decreased this to 30 mg daily on her own and has noted resolution of these symptoms.  --------------------------------------------------------------------------------------------------  Cardiovascular History & Procedures: Cardiovascular Problems:  Chest pain  Palpitations  Risk Factors:  Hyperlipidemia, family history, and age > 23  Cath/PCI:  None  CV Surgery:  None  EP Procedures and Devices:  None  Non-Invasive Evaluation(s):  Pharmacologic MPI (05/06/2019): Abnormal, probably low risk study with small in size, mild in severity reversible apical inferior and apical later defect suggestive of mild ischemia but cannot rule out artifact.  LVEF >65%.  Mild coronary artery calcification present.  Recent CV Pertinent Labs: Lab Results  Component Value Date   CHOL 204 (H) 04/25/2019   HDL 59 04/25/2019   LDLCALC 127 (H) 04/25/2019   TRIG 88 04/25/2019   CHOLHDL 3.5 04/25/2019   K 4.1 04/25/2019   K 4.6 04/24/2017   BUN 21 04/25/2019   BUN 15.0 04/24/2017   CREATININE 0.84 04/25/2019   CREATININE 0.8 04/24/2017    Past medical and surgical history were reviewed and updated in EPIC.  Current Meds  Medication Sig  . aspirin EC 81 MG tablet Take 1 tablet (81 mg total) by mouth daily.  . cyclobenzaprine (FLEXERIL) 5 MG tablet Take 1 tablet by mouth daily as needed.  Marland Kitchen oxybutynin (DITROPAN) 5 MG tablet Take 5 mg by mouth daily.  . simvastatin (ZOCOR) 40 MG tablet Take 1 tablet (40 mg total) by mouth every evening. (Patient taking differently: Take 30 mg by mouth every evening. )    Allergies: Patient has no known allergies.  Social History   Tobacco Use  . Smoking status: Former Smoker    Packs/day: 0.25    Years: 2.00    Pack years: 0.50    Types: Cigarettes    Quit date: 10/31/1968    Years since quitting: 50.8  . Smokeless tobacco: Never Used  Substance Use Topics  . Alcohol use: Yes    Alcohol/week: 1.0 standard drinks    Types: 1 Glasses of wine per week  . Drug use: No    Family History  Problem Relation Age of Onset  . Heart disease Mother   . Diabetes Mother   . Cancer Brother        colon//under treatment  . Bladder Cancer Brother   . Cancer Maternal Aunt        breast/dx 16 years ago/currently 70  .  Alzheimer's disease Father   . COPD Brother   . Heart disease Brother   . Heart disease Brother   . Heart attack Brother   . Lung disease Brother   . Heart disease Sister   . Breast cancer Other 42  . Kidney cancer Neg Hx   . Prostate cancer Neg Hx     Review of Systems: A 12-system review of systems was performed and was negative except as noted in the HPI.  --------------------------------------------------------------------------------------------------  Physical Exam: BP 110/78 (BP Location: Left Arm, Patient Position: Sitting, Cuff Size: Normal)    Pulse 88   Ht 5' 5.5" (1.664 m)   Wt 176 lb 8 oz (80.1 kg)   SpO2 98%   BMI 28.92 kg/m   General: NAD. HEENT: No conjunctival pallor or scleral icterus. Neck: Supple without lymphadenopathy, thyromegaly, JVD, or HJR. Lungs: Normal work of breathing. Clear to auscultation bilaterally without wheezes or crackles. Heart: Regular rate and rhythm without murmurs, rubs, or gallops. Non-displaced PMI. Abd: Bowel sounds present. Soft, NT/ND without hepatosplenomegaly Ext: No lower extremity edema. Radial, PT, and DP pulses are 2+ bilaterally. Skin: Warm and dry without rash.  EKG: Normal sinus rhythm with left axis deviation.  Otherwise, no significant abnormality.  Lab Results  Component Value Date   WBC 6.3 04/25/2019   HGB 13.9 04/25/2019   HCT 42.8 04/25/2019   MCV 94.1 04/25/2019   PLT 284 04/25/2019    Lab Results  Component Value Date   NA 141 04/25/2019   K 4.1 04/25/2019   CL 107 04/25/2019   CO2 26 04/25/2019   BUN 21 04/25/2019   CREATININE 0.84 04/25/2019   GLUCOSE 95 04/25/2019   ALT 12 04/25/2019    Lab Results  Component Value Date   CHOL 204 (H) 04/25/2019   HDL 59 04/25/2019   LDLCALC 127 (H) 04/25/2019   TRIG 88 04/25/2019   CHOLHDL 3.5 04/25/2019    --------------------------------------------------------------------------------------------------  ASSESSMENT AND PLAN: Chest pain: No symptoms since our last visit.  Stress test was low risk with small apical defect that may represent mild ischemia versus artifact.  LVEF was normal.  Mild coronary artery calcification noted.  Given the lack of further symptoms, we will defer additional testing at this time.  I think it would be reasonable to continue with low-dose aspirin as well as statin therapy for goal LDL less than 100 (ideally below 70).  Palpitations: These remain rare and without associated symptoms.  No further work-up at this time.  Hyperlipidemia: Unfortunately, [name] did not tolerate  40 mg daily of simvastatin.  She is on 30 mg daily.  We will check a lipid panel with direct LDL today (as the patient is not fasting), as well as ALT.  If LDL remains greater than 100, I would favor addition of ezetimibe.  Follow-up: Return to clinic in 6 months.  Nelva Bush, MD 08/21/2019 10:06 AM

## 2019-08-21 ENCOUNTER — Other Ambulatory Visit: Payer: Self-pay

## 2019-08-21 ENCOUNTER — Encounter: Payer: Self-pay | Admitting: Internal Medicine

## 2019-08-21 ENCOUNTER — Ambulatory Visit (INDEPENDENT_AMBULATORY_CARE_PROVIDER_SITE_OTHER): Payer: PPO | Admitting: Internal Medicine

## 2019-08-21 VITALS — BP 110/78 | HR 88 | Ht 65.5 in | Wt 176.5 lb

## 2019-08-21 DIAGNOSIS — E785 Hyperlipidemia, unspecified: Secondary | ICD-10-CM

## 2019-08-21 DIAGNOSIS — R002 Palpitations: Secondary | ICD-10-CM | POA: Diagnosis not present

## 2019-08-21 DIAGNOSIS — R079 Chest pain, unspecified: Secondary | ICD-10-CM

## 2019-08-21 MED ORDER — SIMVASTATIN 40 MG PO TABS: ORAL_TABLET | ORAL | Status: DC

## 2019-08-21 NOTE — Patient Instructions (Signed)
Medication Instructions:  Your physician has recommended you make the following change in your medication:  1- CHANGE Simvastatin to 30 mg by mouth once a day.   *If you need a refill on your cardiac medications before your next appointment, please call your pharmacy*  Lab Work: Your physician recommends that you return for lab work in: TODAY - LIPID, DIRECT LDL, ALT.  If you have labs (blood work) drawn today and your tests are completely normal, you will receive your results only by: Marland Kitchen MyChart Message (if you have MyChart) OR . A paper copy in the mail If you have any lab test that is abnormal or we need to change your treatment, we will call you to review the results.  Testing/Procedures: NONE  Follow-Up: At [facility], you and your health needs are our priority.  As part of our continuing mission to provide you with exceptional heart care, we have created designated Provider Care Teams.  These Care Teams include your primary Cardiologist (physician) and Advanced Practice Providers (APPs -  Physician Assistants and Nurse Practitioners) who all work together to provide you with the care you need, when you need it.  Your next appointment:   6 months  The format for your next appointment:   In Person  Provider:    You may see DR Harrell Gave END or one of the following Advanced Practice Providers on your designated Care Team:    Murray Hodgkins, NP  Christell Faith, PA-C  Marrianne Mood, PA-C

## 2019-08-22 LAB — LIPID PANEL
Chol/HDL Ratio: 2.8 ratio (ref 0.0–4.4)
Cholesterol, Total: 178 mg/dL (ref 100–199)
HDL: 63 mg/dL (ref >39)
LDL Chol Calc (NIH): 96 mg/dL (ref 0–99)
Triglycerides: 106 mg/dL (ref 0–149)
VLDL Cholesterol Cal: 19 mg/dL (ref 5–40)

## 2019-08-22 LAB — ALT: ALT: 13 IU/L (ref 0–32)

## 2019-08-22 LAB — LDL CHOLESTEROL, DIRECT: LDL Direct: 95 mg/dL (ref 0–99)

## 2020-01-21 ENCOUNTER — Ambulatory Visit: Payer: Medicare Other | Admitting: Dermatology

## 2020-01-21 ENCOUNTER — Other Ambulatory Visit: Payer: Self-pay

## 2020-01-21 DIAGNOSIS — Z85828 Personal history of other malignant neoplasm of skin: Secondary | ICD-10-CM

## 2020-01-21 DIAGNOSIS — L988 Other specified disorders of the skin and subcutaneous tissue: Secondary | ICD-10-CM

## 2020-01-21 DIAGNOSIS — C44519 Basal cell carcinoma of skin of other part of trunk: Secondary | ICD-10-CM

## 2020-01-21 DIAGNOSIS — L821 Other seborrheic keratosis: Secondary | ICD-10-CM

## 2020-01-21 DIAGNOSIS — L578 Other skin changes due to chronic exposure to nonionizing radiation: Secondary | ICD-10-CM

## 2020-01-21 DIAGNOSIS — L820 Inflamed seborrheic keratosis: Secondary | ICD-10-CM

## 2020-01-21 DIAGNOSIS — L570 Actinic keratosis: Secondary | ICD-10-CM

## 2020-01-21 DIAGNOSIS — L738 Other specified follicular disorders: Secondary | ICD-10-CM | POA: Diagnosis not present

## 2020-01-21 DIAGNOSIS — D485 Neoplasm of uncertain behavior of skin: Secondary | ICD-10-CM

## 2020-01-21 HISTORY — DX: Basal cell carcinoma of skin of other part of trunk: C44.519

## 2020-01-21 NOTE — Progress Notes (Signed)
Follow-Up Visit   Subjective  Krystal Payne is a 74 y.o. female who presents for the following: Follow-up (sun exposed areas), Spot (red bump R chest present, pt noticed a few months ago), and spots (itches, L back).  She is also interested in filler injections around her mouth.  The following portions of the chart were reviewed this encounter and updated as appropriate:     Review of Systems: No other skin or systemic complaints.  Objective  Well appearing patient in no apparent distress; mood and affect are within normal limits.  All skin waist up examined.  Objective  nasal dorsum, R nasal tip, L temple (3): Erythematous thin papules/macules with gritty scale.   Objective  face, chest: Actinic changes   Objective  nasolabial folds: Rhytides and volume loss.   Images        Objective  R upper nasolabial: Well healed scar with no evidence of recurrence.   Objective  R inferior mid jaw, R upper sternum, L flank at braline x 2 (4): 40mm waxy light pink papule at R mid jaw, waxy stuck-on tan papule with erythema  Objective  Left Upper Back: 49mm pink pearly papule       Objective  face: Small yellow papules with a central dell.   Objective  Back: Stuck-on, waxy, tan-brown papules and plaques -- Discussed benign etiology and prognosis.   Assessment & Plan  AK (actinic keratosis) (3) nasal dorsum, R nasal tip, L temple  Destruction of lesion - nasal dorsum, R nasal tip, L temple  Destruction method: cryotherapy   Informed consent: discussed and consent obtained   Lesion destroyed using liquid nitrogen: Yes   Region frozen until ice ball extended beyond lesion: Yes   Outcome: patient tolerated procedure well with no complications   Post-procedure details: wound care instructions given    Actinic skin damage face, chest  Recommend broad spectrum SPF 30 or greater.   Elastosis of skin nasolabial folds  Recommend 1 syringe to cheeks  (Voluma) and 1 syringe around mouth to marionette lines [facility]), pt advised 1 may not be enough.  Pt would like to start with 1 syringe to marionette lines.  Pt will schedule.  History of basal cell carcinoma (BCC) R upper nasolabial  Clear. Observe for recurrence.  Recommend regular skin exams, broad-spectrum spf 30+ sunscreen use, and photoprotection. Call clinic for new or changing lesions.     Inflamed seborrheic keratosis (4) R inferior mid jaw, R upper sternum, L flank at braline x 2  Destruction of lesion - R inferior mid jaw, R upper sternum, L flank at braline x 2  Destruction method: cryotherapy   Informed consent: discussed and consent obtained   Lesion destroyed using liquid nitrogen: Yes   Region frozen until ice ball extended beyond lesion: Yes   Outcome: patient tolerated procedure well with no complications   Post-procedure details: wound care instructions given    Neoplasm of uncertain behavior of skin Left Upper Back  Skin / nail biopsy Type of biopsy: tangential   Informed consent: discussed and consent obtained   Patient was prepped and draped in usual sterile fashion: Area prepped with alcohol. Anesthesia: the lesion was anesthetized in a standard fashion   Anesthetic:  1% lidocaine w/ epinephrine 1-100,000 buffered w/ 8.4% NaHCO3 Instrument used: flexible razor blade   Hemostasis achieved with: pressure, aluminum chloride and electrodesiccation   Outcome: patient tolerated procedure well   Post-procedure details: wound care instructions given   Post-procedure details  comment:  Ointment and small bandage applied  Destruction of lesion  Destruction method: electrodesiccation and curettage   Informed consent: discussed and consent obtained   Timeout:  patient name, date of birth, surgical site, and procedure verified Curettage performed in three different directions: Yes   Electrodesiccation performed over the curetted area: Yes   Lesion length (cm):   0.5 Lesion width (cm):  0.5 Margin per side (cm):  0.3 Final wound size (cm):  1.1 Hemostasis achieved with:  pressure, aluminum chloride and electrodesiccation Outcome: patient tolerated procedure well with no complications   Post-procedure details: wound care instructions given    Specimen 1 - Surgical pathology Differential Diagnosis: Irritated Nevus r/o BCC Check Margins: No 109mm pink pearly papule    Sebaceous hyperplasia face  Benign, observe.    Seborrheic keratosis Back  Benign, observe.    Return in about 6 months (around 07/23/2020) for TBSE.   Graciella Belton, RMA, am acting as scribe for Brendolyn Patty, MD .

## 2020-01-21 NOTE — Patient Instructions (Addendum)
Cryotherapy Aftercare  . Wash gently with soap and water everyday.   Marland Kitchen Apply Vaseline and Band-Aid daily until healed.  Wound Care Instructions  1. Cleanse would gently with soap and water once a day then pat dry with clean gauze. Apply a thing coat of Petrolatum (petroleum jelly, "Vaseline") over the wound (unless you have an allergy to this). We recommend that you use a new, sterile tube of Vaseline. Do not pick or remove scabs. Do not remove the yellow or white "healing tissue" from the base of the wound.  2. Cover the wound with fresh, clean, nonstick gauze and secure with paper tape. You may use Band-Aids in place of gauze and tape if the would is small enough, but would recommend trimming much of the tape off as there is often too much. Sometimes Band-Aids can irritate the skin.  3. You should call the office for your biopsy report after 1 week if you have not already been contacted.  4. If you experience any problems, such as abnormal amounts of bleeding, swelling, significant bruising, significant pain, or evidence of infection, please call the office immediately.  5. FOR ADULT SURGERY PATIENTS: If you need something for pain relief you may take 1 extra strength Tylenol (acetaminophen) AND 2 Ibuprofen (200mg  each) together every 4 hours as needed for pain. (do not take these if you are allergic to them or if you have a reason you should not take them.) Typically, you may only need pain medication for 1 to 3 days.

## 2020-01-27 ENCOUNTER — Telehealth: Payer: Self-pay

## 2020-01-27 NOTE — Telephone Encounter (Signed)
-----   Message from Brendolyn Patty, MD sent at 01/24/2020  8:47 AM EDT ----- Skin , left upper back SUPERFICIAL AND NODULAR BASAL CELL CARCINOMA- already treated with [facility]

## 2020-01-27 NOTE — Telephone Encounter (Signed)
Advised pt of bx results/sh ?

## 2020-02-13 ENCOUNTER — Telehealth: Payer: Self-pay

## 2020-02-13 NOTE — Telephone Encounter (Signed)
Lft pt msg to call and let us know what area we will be txting for tomorrows cosmetic appointment.  Advised if we are txting around lips we will need to send in Valtrex for her to start taking today./sh

## 2020-02-14 ENCOUNTER — Ambulatory Visit (INDEPENDENT_AMBULATORY_CARE_PROVIDER_SITE_OTHER): Payer: Self-pay | Admitting: Dermatology

## 2020-02-14 ENCOUNTER — Other Ambulatory Visit: Payer: Self-pay

## 2020-02-14 DIAGNOSIS — L988 Other specified disorders of the skin and subcutaneous tissue: Secondary | ICD-10-CM

## 2020-02-14 NOTE — Progress Notes (Signed)
   Follow-Up Visit   Subjective  Krystal Payne is a 74 y.o. female who presents for the following: Facial Elastosis (face, pt presents for fillers today).  She wants  Marionette lines treated.  Discussed more than one syringe needed to get best result, but patient wants to try one syringe first and come back for more if needed.   The following portions of the chart were reviewed this encounter and updated as appropriate:     Review of Systems: No other skin or systemic complaints.  Objective  Well appearing patient in no apparent distress; mood and affect are within normal limits.  A focused examination was performed including face. Relevant physical exam findings are noted in the Assessment and Plan.  Objective  face: Rhytides and volume loss.   Images                      Assessment & Plan  Elastosis of skin face  Discussed adding another syringe of Restylane Defyne to marionette lines.  Also discussed Voluma to mid face.  Filling material injection - face Prior to the procedure, the patient's past medical history, allergies and the rare but potential risks and complications were reviewed with the patient and a signed consent was obtained. Pre and post-treatment care was discussed and instructions provided.  Location: marionette lines  Filler Type: Restylane Defyne  Procedure: Lidocaine-tetracaine ointment was applied to treatment areas to achieve good local anesthesia. The area was prepped thoroughly with hibiclens The area was prepped thoroughly with Puracyn. After introducing the needle into the desired treatment area, the syringe plunger was drawn back to ensure there was no flash of blood prior to injecting the filler in order to minimize risk of intravascular injection and vascular occlusion. After injection of the filler, the treated areas were cleansed and iced to reduce swelling. Post-treatment instructions were reviewed with the patient.        Patient tolerated the procedure well. The patient will call with any problems, questions or concerns prior to their next appointment.  Lot [id number] exp 2021-06-30  Return RTC if pt wants to add more fillers.   I, Othelia Pulling, RMA, am acting as scribe for Brendolyn Patty, MD .

## 2020-02-26 ENCOUNTER — Encounter: Payer: Self-pay | Admitting: Internal Medicine

## 2020-02-26 ENCOUNTER — Other Ambulatory Visit: Payer: Self-pay

## 2020-02-26 ENCOUNTER — Ambulatory Visit (INDEPENDENT_AMBULATORY_CARE_PROVIDER_SITE_OTHER): Payer: Medicare Other | Admitting: Internal Medicine

## 2020-02-26 VITALS — BP 110/80 | HR 86 | Ht 65.5 in | Wt 181.0 lb

## 2020-02-26 DIAGNOSIS — E7800 Pure hypercholesterolemia, unspecified: Secondary | ICD-10-CM | POA: Diagnosis not present

## 2020-02-26 DIAGNOSIS — R079 Chest pain, unspecified: Secondary | ICD-10-CM | POA: Diagnosis not present

## 2020-02-26 MED ORDER — SIMVASTATIN 20 MG PO TABS: 30.0000 mg | ORAL_TABLET | Freq: Every day | ORAL | 3 refills | Status: DC

## 2020-02-26 NOTE — Patient Instructions (Signed)
Medication Instructions:  Your physician recommends that you continue on your current medications as directed. Please refer to the Current Medication list given to you today.  *If you need a refill on your cardiac medications before your next appointment, please call your pharmacy*   Lab Work: Send [place] a MyChart message when you have completed lab work at your Primary Care in June. Then Dr End can view them.  If you have labs (blood work) drawn today and your tests are completely normal, you will receive your results only by: Marland Kitchen MyChart Message (if you have MyChart) OR . A paper copy in the mail If you have any lab test that is abnormal or we need to change your treatment, we will call you to review the results.  Follow-Up: At [facility], you and your health needs are our priority.  As part of our continuing mission to provide you with exceptional heart care, we have created designated Provider Care Teams.  These Care Teams include your primary Cardiologist (physician) and Advanced Practice Providers (APPs -  Physician Assistants and Nurse Practitioners) who all work together to provide you with the care you need, when you need it.  We recommend signing up for the patient portal called "MyChart".  Sign up information is provided on this After Visit Summary.  MyChart is used to connect with patients for Virtual Visits (Telemedicine).  Patients are able to view lab/test results, encounter notes, upcoming appointments, etc.  Non-urgent messages can be sent to your provider as well.   To learn more about what you can do with MyChart, go to [email or web address].    Your next appointment:   12 month(s)  The format for your next appointment:   In Person  Provider:    You may see DR Harrell Gave END or one of the following Advanced Practice Providers on your designated Care Team:    Murray Hodgkins, NP  Christell Faith, PA-C  Marrianne Mood, PA-C

## 2020-02-26 NOTE — Progress Notes (Signed)
Follow-up Outpatient Visit Date: 02/26/2020  Primary Care Provider: Juluis Pitch, MD [address] [address]  Chief Complaint: Follow-up chest pain  HPI:  Ms. Groah is a 74 y.o. female with history of left breast cancer status postlumpectomy andradiation as well ashyperlipidemia, who presents for follow-up of chest pain.  I last saw Ms. Yeakel in October, at which time she was feeling well without further episodes of chest pain.  Prior stress test was low risk with small, mild apical inferior and apical lateral reversible defect that could represent ischemia versus artifact.  We agreed to continue with medical therapy.  Today, Ms. Mittman is feeling well.  She denies chest pain, shortness of breath, palpitations, lightheadedness, and edema.  She is working in her yard regularly without limitations.  She does not exercise.  She is tolerating her medications well.  --------------------------------------------------------------------------------------------------  Cardiovascular History & Procedures: Cardiovascular Problems:  Chest pain  Palpitations  Risk Factors:  Hyperlipidemia, family history,and age >72  Cath/PCI:  None  CV Surgery:  None  EP Procedures and Devices:  None  Non-Invasive Evaluation(s):  Pharmacologic MPI (05/06/2019): Abnormal, probably low risk study with small in size, mild in severity reversible apical inferior and apical later defect suggestive of mild ischemia but cannot rule out artifact.  LVEF >65%.  Mild coronary artery calcification present.  Recent CV Pertinent Labs: Lab Results  Component Value Date   CHOL 178 08/21/2019   HDL 63 08/21/2019   LDLCALC 96 08/21/2019   LDLDIRECT 95 08/21/2019   TRIG 106 08/21/2019   CHOLHDL 2.8 08/21/2019   CHOLHDL 3.5 04/25/2019   K 4.1 04/25/2019   K 4.6 04/24/2017   BUN 21 04/25/2019   BUN 15.0 04/24/2017   CREATININE 0.84 04/25/2019   CREATININE 0.8 04/24/2017    Past  medical and surgical history were reviewed and updated in EPIC.  Current Meds  Medication Sig  . aspirin EC 81 MG tablet Take 1 tablet (81 mg total) by mouth daily.  . cyclobenzaprine (FLEXERIL) 5 MG tablet Take 1 tablet by mouth daily as needed.  Marland Kitchen oxybutynin (DITROPAN) 5 MG tablet Take 5 mg by mouth daily.  . simvastatin (ZOCOR) 40 MG tablet Take 30 mg by mouth once every evening.    Allergies: Patient has no known allergies.  Social History   Tobacco Use  . Smoking status: Former Smoker    Packs/day: 0.25    Years: 2.00    Pack years: 0.50    Types: Cigarettes    Quit date: 10/31/1968    Years since quitting: 51.3  . Smokeless tobacco: Never Used  Substance Use Topics  . Alcohol use: Yes    Alcohol/week: 1.0 standard drinks    Types: 1 Glasses of wine per week  . Drug use: No    Family History  Problem Relation Age of Onset  . Heart disease Mother   . Diabetes Mother   . Cancer Brother        colon//under treatment  . Bladder Cancer Brother   . Cancer Maternal Aunt        breast/dx 16 years ago/currently 60  . Alzheimer's disease Father   . COPD Brother   . Heart disease Brother   . Heart disease Brother   . Heart attack Brother   . Lung disease Brother   . Arrhythmia Sister   . Breast cancer Other 42  . Cancer Brother   . Heart disease Sister   . Heart attack Sister   .  Stroke Sister   . Stroke Brother   . Kidney cancer Neg Hx   . Prostate cancer Neg Hx     Review of Systems: A 12-system review of systems was performed and was negative except as noted in the HPI.  --------------------------------------------------------------------------------------------------  Physical Exam: BP 110/80 (BP Location: Left Arm, Patient Position: Sitting, Cuff Size: Normal)   Pulse 86   Ht 5' 5.5" (1.664 m)   Wt 181 lb (82.1 kg)   SpO2 96%   BMI 29.66 kg/m   General:  NAD. Neck: No JVD or HJR. Lungs: CTA bilaterally. Heart: RRR w/o m/r/g. Abd: Soft,  NT/ND. Ext: No LE edema.  EKG:  NSR with left axis deviation and poor R wave progression.  No significant change since 08/21/2019.  Lab Results  Component Value Date   WBC 6.3 04/25/2019   HGB 13.9 04/25/2019   HCT 42.8 04/25/2019   MCV 94.1 04/25/2019   PLT 284 04/25/2019    Lab Results  Component Value Date   NA 141 04/25/2019   K 4.1 04/25/2019   CL 107 04/25/2019   CO2 26 04/25/2019   BUN 21 04/25/2019   CREATININE 0.84 04/25/2019   GLUCOSE 95 04/25/2019   ALT 13 08/21/2019    Lab Results  Component Value Date   CHOL 178 08/21/2019   HDL 63 08/21/2019   LDLCALC 96 08/21/2019   LDLDIRECT 95 08/21/2019   TRIG 106 08/21/2019   CHOLHDL 2.8 08/21/2019    --------------------------------------------------------------------------------------------------  ASSESSMENT AND PLAN: Chest pain: No further chest pain reported, with prior stress test demonstrating small area of possible apical ischemia versus artifact.  We will continue current regimen of aspirin and simvastatin for secondary prevention.  Hyperlipidemia: LDL reasonable (<100).  We will continue simvastatin 30 mg daily, as Ms. Stillman did not tolerate escalation to 40 mg daily in the past.  Follow-up: Return to clinic in 1 year.  Nelva Bush, MD 02/26/2020 10:13 AM

## 2020-03-12 ENCOUNTER — Other Ambulatory Visit: Payer: Self-pay | Admitting: Cardiovascular Disease

## 2020-03-12 ENCOUNTER — Telehealth: Payer: Self-pay | Admitting: Emergency Medicine

## 2020-03-12 MED ORDER — SIMVASTATIN 20 MG PO TABS: 30.0000 mg | ORAL_TABLET | Freq: Every day | ORAL | 3 refills | Status: DC

## 2020-03-12 NOTE — Telephone Encounter (Signed)
Error

## 2020-03-12 NOTE — Telephone Encounter (Signed)
Requested Prescriptions   Signed Prescriptions Disp Refills  . simvastatin (ZOCOR) 20 MG tablet 135 tablet 3    Sig: Take 1.5 tablets (30 mg total) by mouth daily at 6 PM.    Authorizing Provider: END, Clayton    Ordering User: Britt Bottom

## 2020-03-12 NOTE — Telephone Encounter (Signed)
*  STAT* If patient is at the pharmacy, call can be transferred to refill team.   1. Which medications need to be refilled? (please list name of each medication and dose if known)   Simvastatin 30 mg po q d   2. Which pharmacy/location (including street and city if local pharmacy) is medication to be sent to?   [facility]  3. Do they need a 30 day or 90 day supply? Krystal Payne

## 2020-03-13 ENCOUNTER — Telehealth: Payer: Self-pay

## 2020-03-13 NOTE — Telephone Encounter (Signed)
PA started through [employer]  Key: [id number] PA Case ID: [id number]

## 2020-04-01 ENCOUNTER — Other Ambulatory Visit: Payer: Self-pay | Admitting: Family Medicine

## 2020-04-01 DIAGNOSIS — Z1231 Encounter for screening mammogram for malignant neoplasm of breast: Secondary | ICD-10-CM

## 2020-04-03 ENCOUNTER — Other Ambulatory Visit: Payer: Self-pay

## 2020-04-03 ENCOUNTER — Ambulatory Visit
Admission: RE | Admit: 2020-04-03 | Discharge: 2020-04-03 | Disposition: A | Discharge status: Home / Self Care | Payer: Medicare Other | Source: Ambulatory Visit | Attending: Family Medicine | Admitting: Family Medicine

## 2020-04-03 DIAGNOSIS — Z1231 Encounter for screening mammogram for malignant neoplasm of breast: Secondary | ICD-10-CM

## 2020-07-07 ENCOUNTER — Other Ambulatory Visit: Payer: Self-pay

## 2020-07-07 ENCOUNTER — Ambulatory Visit: Payer: Medicare Other | Admitting: Dermatology

## 2020-07-07 ENCOUNTER — Encounter: Payer: Self-pay | Admitting: Dermatology

## 2020-07-07 DIAGNOSIS — T148XXA Other injury of unspecified body region, initial encounter: Secondary | ICD-10-CM

## 2020-07-07 DIAGNOSIS — Z1283 Encounter for screening for malignant neoplasm of skin: Secondary | ICD-10-CM

## 2020-07-07 DIAGNOSIS — D229 Melanocytic nevi, unspecified: Secondary | ICD-10-CM

## 2020-07-07 DIAGNOSIS — Z85828 Personal history of other malignant neoplasm of skin: Secondary | ICD-10-CM

## 2020-07-07 DIAGNOSIS — C44519 Basal cell carcinoma of skin of other part of trunk: Secondary | ICD-10-CM

## 2020-07-07 DIAGNOSIS — L578 Other skin changes due to chronic exposure to nonionizing radiation: Secondary | ICD-10-CM

## 2020-07-07 DIAGNOSIS — D1800 Hemangioma unspecified site: Secondary | ICD-10-CM

## 2020-07-07 DIAGNOSIS — D225 Melanocytic nevi of trunk: Secondary | ICD-10-CM | POA: Diagnosis not present

## 2020-07-07 DIAGNOSIS — L821 Other seborrheic keratosis: Secondary | ICD-10-CM

## 2020-07-07 DIAGNOSIS — L219 Seborrheic dermatitis, unspecified: Secondary | ICD-10-CM | POA: Diagnosis not present

## 2020-07-07 DIAGNOSIS — L814 Other melanin hyperpigmentation: Secondary | ICD-10-CM

## 2020-07-07 DIAGNOSIS — D2272 Melanocytic nevi of left lower limb, including hip: Secondary | ICD-10-CM

## 2020-07-07 DIAGNOSIS — L820 Inflamed seborrheic keratosis: Secondary | ICD-10-CM

## 2020-07-07 DIAGNOSIS — D492 Neoplasm of unspecified behavior of bone, soft tissue, and skin: Secondary | ICD-10-CM

## 2020-07-07 MED ORDER — HYDROCORTISONE 2.5 % EX CREA: TOPICAL_CREAM | CUTANEOUS | 2 refills | Status: DC

## 2020-07-07 MED ORDER — KETOCONAZOLE 2 % EX CREA: 1.0000 "application " | TOPICAL_CREAM | Freq: Every day | CUTANEOUS | 3 refills | Status: AC

## 2020-07-07 NOTE — Patient Instructions (Signed)

## 2020-07-07 NOTE — Progress Notes (Signed)
Follow-Up Visit   Subjective  Krystal Payne is a 74 y.o. female who presents for the following: Annual exam (Total body skin exam,hx of BCC mid upper sternum, R upper nasolabial) and Seborrheic Dermatitis (face, ketoconazole 2% cr, HC 2.5% cream).  Most recent Quinlan was treated 3/21 with EDC L upper back.   The following portions of the chart were reviewed this encounter and updated as appropriate:      Review of Systems:  No other skin or systemic complaints except as noted in HPI or Assessment and Plan.  Objective  Well appearing patient in no apparent distress; mood and affect are within normal limits.  A full examination was performed including scalp, head, eyes, ears, nose, lips, neck, chest, axillae, abdomen, back, buttocks, bilateral upper extremities, bilateral lower extremities, hands, feet, fingers, toes, fingernails, and toenails. All findings within normal limits unless otherwise noted below.  Objective  face: Mild erythema and scale alar crease bilateral, similar L temple  Objective  Left mid upper back: 7.0 x 3.23mm pink pearly pap     Objective    R upper buttock: 4.0 x 3.93mm med brown macule  Left plantar foot: 3.7mm brown macule  Objective  L temple x 1, L zygoma x 1 (2): Pink scaly macules, slightly waxy  Objective  R posterior neck: excoriation   Assessment & Plan   Skin cancer screening performed today.  Actinic Damage - diffuse scaly erythematous macules with underlying dyspigmentation - Recommend daily broad spectrum sunscreen SPF 30+ to sun-exposed areas, reapply every 2 hours as needed.  - Call for new or changing lesions.  Hemangiomas - Red papules - Discussed benign nature - Observe - Call for any changes  Seborrheic Keratoses - Stuck-on, waxy, tan-brown papules and plaques  - Discussed benign etiology and prognosis. - Observe - Call for any changes  Lentigines - Scattered tan macules - Discussed due to sun exposure -  Benign, observe - Call for any changes  History of Basal Cell Carcinoma of the Skin - No evidence of recurrence today - Mid upper sternum, R upper nasolabial, L upper back - Recommend regular full body skin exams - Recommend daily broad spectrum sunscreen SPF 30+ to sun-exposed areas, reapply every 2 hours as needed.  - Call if any new or changing lesions are noted between office visits  Seborrheic dermatitis face  Cont Ketoconazole 2% cream qd Cont HC 2.5% cream qd/bid prn flares  ketoconazole (NIZORAL) 2 % cream - face  hydrocortisone 2.5 % cream - face  Neoplasm of skin Left mid upper back  Epidermal / dermal shaving  Lesion diameter (cm):  0.7 Informed consent: discussed and consent obtained   Timeout: patient name, date of birth, surgical site, and procedure verified   Procedure prep:  Patient was prepped and draped in usual sterile fashion Prep type:  Isopropyl alcohol Anesthesia: the lesion was anesthetized in a standard fashion   Anesthetic:  1% lidocaine w/ epinephrine 1-100,000 buffered w/ 8.4% NaHCO3 Instrument used: flexible razor blade   Hemostasis achieved with: pressure and aluminum chloride   Outcome: patient tolerated procedure well    Destruction of lesion  Destruction method: electrodesiccation and curettage   Informed consent: discussed and consent obtained   Timeout:  patient name, date of birth, surgical site, and procedure verified Curettage performed in three different directions: Yes   Electrodesiccation performed over the curetted area: Yes   Lesion length (cm):  0.7 Lesion width (cm):  0.3 Margin per side (cm):  0.1 Final wound size (cm):  0.9 Hemostasis achieved with:  pressure, aluminum chloride and electrodesiccation Outcome: patient tolerated procedure well with no complications   Post-procedure details: wound care instructions given   Additional details:  Mupirocin ointment and Bandaid applied    Specimen 1 - Surgical  pathology Differential Diagnosis: D48.5 Irritated Scar r/o BCC Check Margins: No 7.0 x 3.10mm pink pearly pap EDC today  Scar r/o BCC Biopsy and EDC performed today  Nevus (2) Left plantar foot; R upper buttock  Benign appearing, observe for changes  Inflamed seborrheic keratosis (2) L temple x 1, L zygoma x 1  Prior to procedure, discussed risks of blister formation, small wound, skin dyspigmentation, or rare scar following cryotherapy.    Destruction of lesion - L temple x 1, L zygoma x 1  Destruction method: cryotherapy   Informed consent: discussed and consent obtained   Lesion destroyed using liquid nitrogen: Yes   Region frozen until ice ball extended beyond lesion: Yes   Outcome: patient tolerated procedure well with no complications   Post-procedure details: wound care instructions given    Excoriation R posterior neck  Recheck on f/u  Return in about 6 months (around 01/04/2021) for TBSE, hx BCCs, Aks, recheck excoriation R post neck.  I, Othelia Pulling, RMA, am acting as scribe for Brendolyn Patty, MD . Documentation: I have reviewed the above documentation for accuracy and completeness, and I agree with the above.  Brendolyn Patty MD

## 2020-07-13 ENCOUNTER — Telehealth: Payer: Self-pay

## 2020-07-13 NOTE — Telephone Encounter (Signed)
Advised patient that biopsy done on the left mid upper back was [facility] and was treated with EDC at that time. Patient to keep 6 mo follow-up appt.

## 2020-11-27 ENCOUNTER — Telehealth: Payer: Self-pay | Admitting: Internal Medicine

## 2020-11-27 MED ORDER — SIMVASTATIN 20 MG PO TABS
30.0000 mg | ORAL_TABLET | Freq: Every day | ORAL | 1 refills | Status: DC
Start: 2020-11-27 — End: 2021-02-04

## 2020-11-27 NOTE — Telephone Encounter (Signed)
*  STAT* If patient is at the pharmacy, call can be transferred to refill team.   1. Which medications need to be refilled? (please list name of each medication and dose if known)   Simvastatin 30 mg po q d   2. Which pharmacy/location (including street and city if local pharmacy) is medication to be sent to?  [phone]  3. Do they need a 30 day or 90 day supply? [place]

## 2020-11-27 NOTE — Telephone Encounter (Signed)
Rx request sent to pharmacy.  

## 2020-12-11 DIAGNOSIS — M7581 Other shoulder lesions, right shoulder: Secondary | ICD-10-CM | POA: Insufficient documentation

## 2021-01-07 ENCOUNTER — Telehealth: Payer: Self-pay | Admitting: Internal Medicine

## 2021-01-07 NOTE — Telephone Encounter (Signed)
Medication was sent in 11/27/2020 with refills.  Will call pharmacy to confirm.   simvastatin (ZOCOR) 20 MG tablet 135 tablet 1 11/27/2020    Sig - Route: Take 1.5 tablets (30 mg total) by mouth daily at 6 PM. - Oral   Sent to pharmacy as: simvastatin (ZOCOR) 20 MG tablet   Notes to Pharmacy: Dosage increase   E-Prescribing Status: Receipt confirmed by pharmacy (11/27/2020  1:29 PM EST)     Pharmacy  [facility] [place]

## 2021-01-07 NOTE — Telephone Encounter (Signed)
*  STAT* If patient is at the pharmacy, call can be transferred to refill team.   1. Which medications need to be refilled? (please list name of each medication and dose if known) simvastatin 20 MG 1.5 tablets daily  2. Which pharmacy/location (including street and city if local pharmacy) is medication to be sent to? [facility]   3. Do they need a 30 day or 90 day supply? 90 day

## 2021-01-12 ENCOUNTER — Encounter: Payer: Self-pay | Admitting: Dermatology

## 2021-01-12 ENCOUNTER — Ambulatory Visit: Payer: Medicare Other | Admitting: Dermatology

## 2021-01-12 ENCOUNTER — Other Ambulatory Visit: Payer: Self-pay

## 2021-01-12 DIAGNOSIS — L853 Xerosis cutis: Secondary | ICD-10-CM

## 2021-01-12 DIAGNOSIS — L219 Seborrheic dermatitis, unspecified: Secondary | ICD-10-CM | POA: Diagnosis not present

## 2021-01-12 DIAGNOSIS — D229 Melanocytic nevi, unspecified: Secondary | ICD-10-CM

## 2021-01-12 DIAGNOSIS — Z1283 Encounter for screening for malignant neoplasm of skin: Secondary | ICD-10-CM

## 2021-01-12 DIAGNOSIS — D1800 Hemangioma unspecified site: Secondary | ICD-10-CM

## 2021-01-12 DIAGNOSIS — D225 Melanocytic nevi of trunk: Secondary | ICD-10-CM | POA: Diagnosis not present

## 2021-01-12 DIAGNOSIS — L820 Inflamed seborrheic keratosis: Secondary | ICD-10-CM

## 2021-01-12 DIAGNOSIS — Z85828 Personal history of other malignant neoplasm of skin: Secondary | ICD-10-CM | POA: Diagnosis not present

## 2021-01-12 DIAGNOSIS — L570 Actinic keratosis: Secondary | ICD-10-CM

## 2021-01-12 DIAGNOSIS — L578 Other skin changes due to chronic exposure to nonionizing radiation: Secondary | ICD-10-CM

## 2021-01-12 DIAGNOSIS — L988 Other specified disorders of the skin and subcutaneous tissue: Secondary | ICD-10-CM

## 2021-01-12 DIAGNOSIS — L738 Other specified follicular disorders: Secondary | ICD-10-CM

## 2021-01-12 DIAGNOSIS — L821 Other seborrheic keratosis: Secondary | ICD-10-CM

## 2021-01-12 DIAGNOSIS — L814 Other melanin hyperpigmentation: Secondary | ICD-10-CM

## 2021-01-12 NOTE — Progress Notes (Signed)
Follow-Up Visit   Subjective  Krystal Payne is a 75 y.o. female who presents for the following: Total body skin exam (Hx of BCC, AKs), recheck excoriation (R post neck), itchy spots (Back, ~83m, itchy), and check growth (L brow). Picks at it.   The following portions of the chart were reviewed this encounter and updated as appropriate:       Review of Systems:  No other skin or systemic complaints except as noted in HPI or Assessment and Plan.  Objective  Well appearing patient in no apparent distress; mood and affect are within normal limits.  A full examination was performed including scalp, head, eyes, ears, nose, lips, neck, chest, axillae, abdomen, back, buttocks, bilateral upper extremities, bilateral lower extremities, hands, feet, fingers, toes, fingernails, and toenails. All findings within normal limits unless otherwise noted below.  Objective  multiple: Well healed scars with no evidence of recurrence.   Objective  face: Clear today  Objective  Left chest x 1, upper back x 2 (3): Pink keratotic macules   Objective  Left upper back at braline x 1, L mid eyebrow x 1 (2): Erythematous keratotic or waxy stuck-on papule or plaque.   Right upper buttock  Objective  Right upper buttock: 2.5 x 3.24mm brown macule  Objective  face: Yellow lobulated paps  Objective  face: Rhytides and volume loss.    Assessment & Plan    Lentigines - Scattered tan macules - Due to sun exposure - Benign-appering, observe - Recommend daily broad spectrum sunscreen SPF 30+ to sun-exposed areas, reapply every 2 hours as needed. - Call for any changes  Seborrheic Keratoses - Stuck-on, waxy, tan-brown papules and plaques  - Discussed benign etiology and prognosis. - Observe - Call for any changes  Melanocytic Nevi - Tan-brown and/or pink-flesh-colored symmetric macules and papules - Benign appearing on exam today - Observation - Call clinic for new or changing  moles - Recommend daily use of broad spectrum spf 30+ sunscreen to sun-exposed areas.   Hemangiomas - Red papules - Discussed benign nature - Observe - Call for any changes  Actinic Damage - Chronic, secondary to cumulative UV/sun exposure - diffuse scaly erythematous macules with underlying dyspigmentation - Recommend daily broad spectrum sunscreen SPF 30+ to sun-exposed areas, reapply every 2 hours as needed.  - Call for new or changing lesions.  Skin cancer screening performed today.  Xerosis - diffuse xerotic patches - recommend gentle, hydrating skin care - gentle skin care handout given  History of basal cell carcinoma (BCC) multiple  Clear. Observe for recurrence. Call clinic for new or changing lesions.  Recommend regular skin exams, daily broad-spectrum spf 30+ sunscreen use, and photoprotection.     Seborrheic dermatitis face  Seborrheic Dermatitis- controlled  -  is a chronic persistent rash characterized by pinkness and scaling most commonly of the mid face but also can occur on the scalp (dandruff), ears; mid chest and mid back. It tends to be exacerbated by stress and cooler weather.  People who have neurologic disease may experience new onset or exacerbation of existing seborrheic dermatitis.  The condition is not curable but treatable and can be controlled.  Cont Ketoconazole 2% cr qhs Cont HC 2.5% cr qhs prn flares  Other Related Medications hydrocortisone 2.5 % cream  AK (actinic keratosis) (3) Left chest x 1, upper back x 2  vs ISK   Destruction of lesion - Left chest x 1, upper back x 2  Destruction method: cryotherapy  Informed consent: discussed and consent obtained   Lesion destroyed using liquid nitrogen: Yes   Region frozen until ice ball extended beyond lesion: Yes   Outcome: patient tolerated procedure well with no complications   Post-procedure details: wound care instructions given    Inflamed seborrheic keratosis (2) Left upper  back at braline x 1, L mid eyebrow x 1  Destruction of lesion - Left upper back at braline x 1, L mid eyebrow x 1 Complexity: simple   Destruction method: cryotherapy   Informed consent: discussed and consent obtained   Lesion destroyed using liquid nitrogen: Yes   Region frozen until ice ball extended beyond lesion: Yes   Outcome: patient tolerated procedure well with no complications   Post-procedure details: wound care instructions given    Nevus Right upper buttock  Benign-appearing.  Observation.  Call clinic for new or changing moles.  Recommend daily use of broad spectrum spf 30+ sunscreen to sun-exposed areas.    Sebaceous hyperplasia face  Benign, observe.    Elastosis of skin face  Discussed Botox to frown complex ~25 units Discussed Restylane Defyne to marionette lines, pt had done a year ago 1 syringe with good result Pt may schedule.   Return in about 6 months (around 07/15/2021) for recheck BCCs, AKs.  I, Sonya Hupman, RMA, am acting as scribe for Brendolyn Patty, MD .  Documentation: I have reviewed the above documentation for accuracy and completeness, and I agree with the above.  Brendolyn Patty MD

## 2021-01-12 NOTE — Patient Instructions (Signed)

## 2021-01-25 ENCOUNTER — Telehealth: Payer: Self-pay | Admitting: Internal Medicine

## 2021-01-25 NOTE — Telephone Encounter (Signed)
It just needs to be noted in the chart (as it is now) and the fax filed with the prior authorizations.

## 2021-01-25 NOTE — Telephone Encounter (Signed)
Received fax from [place] stating prior authorization for Simvastatin has been approved from 10/31/20-10/30/21. Approved to receive a quantity of up to 135 tablets every 90 days as prescribed by the physician.

## 2021-02-04 MED ORDER — SIMVASTATIN 20 MG PO TABS: 30.0000 mg | ORAL_TABLET | Freq: Every day | ORAL | 0 refills | Status: DC

## 2021-02-04 NOTE — Addendum Note (Signed)
Addended by: Raelene Bott, Maylyn Narvaiz L on: 02/04/2021 02:01 PM   Modules accepted: Orders

## 2021-02-04 NOTE — Telephone Encounter (Signed)
Requested Prescriptions   Signed Prescriptions Disp Refills   simvastatin (ZOCOR) 20 MG tablet 135 tablet 0    Sig: Take 1.5 tablets (30 mg total) by mouth daily at 6 PM.    Authorizing Provider: END, CHRISTOPHER    Ordering User: Raelene Bott, Auren Valdes L

## 2021-02-04 NOTE — Telephone Encounter (Signed)
*  STAT* If patient is at the pharmacy, call can be transferred to refill team.   1. Which medications need to be refilled? (please list name of each medication and dose if known) simvastatin 20 MG 1.5 tablets daily   2. Which pharmacy/location (including street and city if local pharmacy) is medication to be sent to? [facility]   3. Do they need a 30 day or 90 day supply? 90 day   Pharmacy says prior auth was approved but there was never a prescription sent in.  Patient is out of medication. Please send ASAP.

## 2021-02-26 ENCOUNTER — Ambulatory Visit (INDEPENDENT_AMBULATORY_CARE_PROVIDER_SITE_OTHER): Payer: Medicare HMO

## 2021-02-26 ENCOUNTER — Other Ambulatory Visit: Payer: Self-pay

## 2021-02-26 ENCOUNTER — Encounter: Payer: Self-pay | Admitting: Internal Medicine

## 2021-02-26 ENCOUNTER — Ambulatory Visit: Payer: Medicare HMO | Admitting: Internal Medicine

## 2021-02-26 VITALS — BP 104/68 | HR 76 | Ht 65.5 in | Wt 179.0 lb

## 2021-02-26 DIAGNOSIS — R9439 Abnormal result of other cardiovascular function study: Secondary | ICD-10-CM | POA: Diagnosis not present

## 2021-02-26 DIAGNOSIS — R002 Palpitations: Secondary | ICD-10-CM

## 2021-02-26 DIAGNOSIS — R000 Tachycardia, unspecified: Secondary | ICD-10-CM

## 2021-02-26 DIAGNOSIS — E7800 Pure hypercholesterolemia, unspecified: Secondary | ICD-10-CM | POA: Diagnosis not present

## 2021-02-26 NOTE — Patient Instructions (Signed)
Medication Instructions:  Your physician recommends that you continue on your current medications as directed. Please refer to the Current Medication list given to you today.  *If you need a refill on your cardiac medications before your next appointment, please call your pharmacy*   Lab Work: None ordered If you have labs (blood work) drawn today and your tests are completely normal, you will receive your results only by: Marland Kitchen MyChart Message (if you have MyChart) OR . A paper copy in the mail If you have any lab test that is abnormal or we need to change your treatment, we will call you to review the results.   Testing/Procedures: Your physician has recommended that you wear a Zio XT monitor. (To be worn for 14 days) The monitor will be mailed to your home. Please follow the application instructions on how to apply and return.  This monitor is a medical device that records the heart's electrical activity. Doctors most often use these monitors to diagnose arrhythmias. Arrhythmias are problems with the speed or rhythm of the heartbeat. The monitor is a small device applied to your chest. You can wear one while you do your normal daily activities. While wearing this monitor if you have any symptoms to push the button and record what you felt. Once you have worn this monitor for the period of time provider prescribed (Usually 14 days), you will return the monitor device in the postage paid box. Once it is returned they will download the data collected and provide [place] with a report which the provider will then review and we will call you with those results. Important tips:  1. Avoid showering during the first 24 hours of wearing the monitor. 2. Avoid excessive sweating to help maximize wear time. 3. Do not submerge the device, no hot tubs, and no swimming pools. 4. Keep any lotions or oils away from the patch. 5. After 24 hours you may shower with the patch on. Take brief showers with your back  facing the shower head.  6. Do not remove patch once it has been placed because that will interrupt data and decrease adhesive wear time. 7. Push the button when you have any symptoms and write down what you were feeling. 8. Once you have completed wearing your monitor, remove and place into box which has postage paid and place in your outgoing mailbox.  9. If for some reason you have misplaced your box then call our office and we can provide another box and/or mail it off for you.       Follow-Up: At [facility], you and your health needs are our priority.  As part of our continuing mission to provide you with exceptional heart care, we have created designated Provider Care Teams.  These Care Teams include your primary Cardiologist (physician) and Advanced Practice Providers (APPs -  Physician Assistants and Nurse Practitioners) who all work together to provide you with the care you need, when you need it.  We recommend signing up for the patient portal called "MyChart".  Sign up information is provided on this After Visit Summary.  MyChart is used to connect with patients for Virtual Visits (Telemedicine).  Patients are able to view lab/test results, encounter notes, upcoming appointments, etc.  Non-urgent messages can be sent to your provider as well.   To learn more about what you can do with MyChart, go to [email or web address].    Your next appointment:   Your physician wants you to follow-up in: 1 year  You will receive a reminder letter in the mail two months in advance. If you don't receive a letter, please call our office to schedule the follow-up appointment.   The format for your next appointment:   In Person  Provider:   You may see Nelva Bush, MD or one of the following Advanced Practice Providers on your designated Care Team:    Murray Hodgkins, NP  Christell Faith, PA-C  Marrianne Mood, PA-C  Cadence [place], [place]  Laurann Montana, NP    Other  Instructions N/A

## 2021-02-26 NOTE — Progress Notes (Signed)
Follow-up Outpatient Visit Date: 02/26/2021  Primary Care Provider: Juluis Pitch, MD [address] [address]  Chief Complaint: Elevated heart rates  HPI:  Krystal Payne is a 75 y.o. female with history of left breast cancer status postlumpectomy andradiation as well ashyperlipidemia, who presents for follow-up of chest pain.  I last saw her a year ago for follow-up of chest pain, at which time Krystal Payne was feeling well.  She was asymptomatic at the time, with prior stress test showing small apical defect with possible artifact versus ischemia.  We elected to continue with medical therapy.  Today, Krystal Payne reports that she has been feeling well other than occasional palpitations.  Shortly after she lies down, she sometimes feels like her heart is beating faster than it should be.  She checks her heart rate on her phone and notes that it has been up to the 120s at rest.  The episodes typically last about 10 minutes though they have been a longer on occasion.  These seem to be more frequent now that she has been doing more work outside in her yard.  She denies chest pain, shortness of breath, and lightheadedness.  She has mild sporadic dependent edema.  --------------------------------------------------------------------------------------------------  Cardiovascular History & Procedures: Cardiovascular Problems:  Chest pain  Palpitations  Risk Factors:  Hyperlipidemia, family history,and age >28  Cath/PCI:  None  CV Surgery:  None  EP Procedures and Devices:  None  Non-Invasive Evaluation(s):  Pharmacologic MPI (05/06/2019): Abnormal, probably low risk study with small in size, mild in severity reversible apical inferior and apical later defect suggestive of mild ischemia but cannot rule out artifact. LVEF >65%. Mild coronary artery calcification present.   Recent CV Pertinent Labs: Lab Results  Component Value Date   CHOL 178 08/21/2019   HDL  63 08/21/2019   LDLCALC 96 08/21/2019   LDLDIRECT 95 08/21/2019   TRIG 106 08/21/2019   CHOLHDL 2.8 08/21/2019   CHOLHDL 3.5 04/25/2019   K 4.1 04/25/2019   K 4.6 04/24/2017   BUN 21 04/25/2019   BUN 15.0 04/24/2017   CREATININE 0.84 04/25/2019   CREATININE 0.8 04/24/2017    Past medical and surgical history were reviewed and updated in EPIC.  Current Meds  Medication Sig  . aspirin EC 81 MG tablet Take 1 tablet (81 mg total) by mouth daily.  . cyclobenzaprine (FLEXERIL) 5 MG tablet Take 1 tablet by mouth daily as needed.  . hydrocortisone 2.5 % cream Apply topically as directed. Qd up to 5 days a week to scaly areas around nose and temples prn flares  . oxybutynin (DITROPAN) 5 MG tablet Take 5 mg by mouth daily.  . simvastatin (ZOCOR) 20 MG tablet Take 1.5 tablets (30 mg total) by mouth daily at 6 PM.    Allergies: Patient has no known allergies.  Social History   Tobacco Use  . Smoking status: Former Smoker    Packs/day: 0.25    Years: 2.00    Pack years: 0.50    Types: Cigarettes    Quit date: 10/31/1968    Years since quitting: 52.3  . Smokeless tobacco: Never Used  Vaping Use  . Vaping Use: Never used  Substance Use Topics  . Alcohol use: Yes    Alcohol/week: 1.0 standard drink    Types: 1 Glasses of wine per week    Comment: weekly  . Drug use: No    Family History  Problem Relation Age of Onset  . Heart  disease Mother   . Diabetes Mother   . Cancer Brother        colon//under treatment  . Bladder Cancer Brother   . Cancer Maternal Aunt        breast/dx 16 years ago/currently 77  . Alzheimer's disease Father   . COPD Brother   . Heart disease Brother   . Heart disease Brother   . Heart attack Brother   . Lung disease Brother   . Arrhythmia Sister   . Breast cancer Other 42  . Cancer Brother   . Heart disease Sister   . Heart attack Sister   . Stroke Sister   . Stroke Brother   . Kidney cancer Neg Hx   . Prostate cancer Neg Hx     Review of  Systems: A 12-system review of systems was performed and was negative except as noted in the HPI.  --------------------------------------------------------------------------------------------------  Physical Exam: BP 104/68 (BP Location: Left Arm, Patient Position: Sitting, Cuff Size: Large)   Pulse 76   Ht 5' 5.5" (1.664 m)   Wt 179 lb (81.2 kg)   SpO2 95%   BMI 29.33 kg/m   General:  NAD. Neck: No JVD or HJR. Lungs: Clear to auscultation bilaterally without wheezes or crackles. Heart: Regular rate and rhythm without murmurs, rubs, or gallops. Abdomen: Soft, nontender, nondistended. Extremities: No lower extremity edema.  EKG: Normal sinus rhythm with left axis deviation and borderline LVH.  No significant change since 02/26/2020.  Lab Results  Component Value Date   WBC 6.3 04/25/2019   HGB 13.9 04/25/2019   HCT 42.8 04/25/2019   MCV 94.1 04/25/2019   PLT 284 04/25/2019    Lab Results  Component Value Date   NA 141 04/25/2019   K 4.1 04/25/2019   CL 107 04/25/2019   CO2 26 04/25/2019   BUN 21 04/25/2019   CREATININE 0.84 04/25/2019   GLUCOSE 95 04/25/2019   ALT 13 08/21/2019    Lab Results  Component Value Date   CHOL 178 08/21/2019   HDL 63 08/21/2019   LDLCALC 96 08/21/2019   LDLDIRECT 95 08/21/2019   TRIG 106 08/21/2019   CHOLHDL 2.8 08/21/2019   Outside lipid panel (04/17/2020): Total cholesterol 208, triglycerides 163, HDL 67, LDL 108  --------------------------------------------------------------------------------------------------  ASSESSMENT AND PLAN: Palpitations and tachycardia: Over the last few months, Krystal Payne has noticed elevated heart rates when lying down.  There are no associated symptoms.  However, given elevated heart rates, paroxysmal atrial fibrillation is a consideration.  EKG today shows normal sinus rhythm.  We have agreed to perform a 14-day event monitor for further characterization.  I will defer drawing labs today, as Krystal Payne  is due for complete physical with Krystal Payne in June.  TSH should be included with labs at that time.  Abnormal stress test: Krystal Payne denies further chest pain.  Prior myocardial perfusion stress test showed a small, reversible defect at the apex that could reflect a small area of ischemia versus artifact.  Given the lack of further symptoms, we will defer additional testing at this time.  Hyperlipidemia: Most recent lipid panel in June, 2021, showed LDL just above goal at 108.  Ms. Roepke did not tolerate further escalation of simvastatin in the past.  We will plan to continue simvastatin 30 mg daily with repeat lipid panel through Krystal Payne in June.  If LDL remains above 100, it might be worthwhile to consider switching to a high potency statin for target LDL  less than 100, ideally below 70.  Follow-up: Return to clinic in 1 year.  Nelva Bush, MD 02/26/2021 9:18 AM

## 2021-03-08 ENCOUNTER — Ambulatory Visit: Payer: Medicare HMO | Admitting: Dermatology

## 2021-03-15 ENCOUNTER — Telehealth: Payer: Self-pay | Admitting: Internal Medicine

## 2021-03-15 NOTE — Telephone Encounter (Signed)
Spoke with the patient. Apologized for the delay. Adv her that the was an error in the order and that it has been corrected. Adv her that the 14 day zio monitor should be mailed out to her in the next couple of days. Patient verbalized understanding. Thanked the patient for calling to follow up.

## 2021-03-15 NOTE — Telephone Encounter (Signed)
Patient states she has not received her monitor patch. Please call to discuss.

## 2021-03-28 DIAGNOSIS — R000 Tachycardia, unspecified: Secondary | ICD-10-CM

## 2021-03-28 DIAGNOSIS — R002 Palpitations: Secondary | ICD-10-CM | POA: Diagnosis not present

## 2021-03-30 ENCOUNTER — Other Ambulatory Visit: Payer: Self-pay | Admitting: Family Medicine

## 2021-03-30 DIAGNOSIS — Z1231 Encounter for screening mammogram for malignant neoplasm of breast: Secondary | ICD-10-CM

## 2021-04-16 ENCOUNTER — Telehealth: Payer: Self-pay | Admitting: Internal Medicine

## 2021-04-16 NOTE — Telephone Encounter (Signed)
Call received from [employer] with a critical report from the patient's ZIO monitor.  Monitor was placed for palpitations.  Per Sharyn Lull with [employer], the patient had: 1) SVT @ 184 bpm lasting 60 seconds that occurred on 03/29/21 @ 1041 (Page 15-16 , strip 7 of report)  2) 2249 total episodes of SVT noted  We are currently awaiting the full monitor report to be uploaded, but will forward to Dr. Saunders Revel.

## 2021-04-16 NOTE — Telephone Encounter (Signed)
[employer] calling with critical results  Transferred to [facility]

## 2021-04-19 NOTE — Telephone Encounter (Signed)
Full monitor has been reviewed, demonstrating numerous episodes of narrow-complex tachycardia.  Many likely represent PSVT, though some episodes are irregular and concerning for paroxysmal atrial fibrillation.  I will review monitor tracings with Dr. Caryl Comes when he is in the office tomorrow and provide further recommendations thereafter.  Nelva Bush, MD [facility]

## 2021-04-20 MED ORDER — METOPROLOL SUCCINATE ER 25 MG PO TB24: 12.5000 mg | ORAL_TABLET | Freq: Every day | ORAL | 5 refills | Status: DC

## 2021-04-20 NOTE — Telephone Encounter (Signed)
Scheduled

## 2021-04-20 NOTE — Telephone Encounter (Signed)
I spoke with Ms. Pete regarding the results of her event monitor, which showed numerous episodes of narrow complex tachycardia most consistent with SVT though some irregularity raises the potential for PAF.  Longest episode lasted 4 hours and 21 minutes.  Tracings were reviewed with Dr. Caryl Comes.  Given inconclusive findings of atrial fibrillation with CHA2DS2-VASc score of 2 and duration of episodes less than 12 hours, anticoagulation will be deferred.  Other than sporadic palpitations, predominantly at night, Ms. Beg has been asymptomatic.  We have discussed trial of beta-blocker therapy and will initiate metoprolol succinate 12.5 mg daily.  We will arrange for Ms. Mccaskey to follow-up with me or an APP in the office in 6 to 8 weeks to assess her response.  Nelva Bush, MD [facility]

## 2021-04-20 NOTE — Telephone Encounter (Signed)
Reviewed the patient's chart. Dr. Saunders Revel sent in her metoprolol RX to the pharmacy already.  Will forward to scheduling pool to please arrange for a follow up appointment in office in 6-8 weeks to re-evaluate her as per Dr. Saunders Revel.

## 2021-04-21 DIAGNOSIS — Z79899 Other long term (current) drug therapy: Secondary | ICD-10-CM

## 2021-04-21 DIAGNOSIS — E7800 Pure hypercholesterolemia, unspecified: Secondary | ICD-10-CM

## 2021-04-21 DIAGNOSIS — E785 Hyperlipidemia, unspecified: Secondary | ICD-10-CM

## 2021-04-22 ENCOUNTER — Encounter: Payer: Self-pay | Admitting: *Deleted

## 2021-04-28 MED ORDER — ROSUVASTATIN CALCIUM 20 MG PO TABS: 20.0000 mg | ORAL_TABLET | Freq: Every day | ORAL | 5 refills | Status: DC

## 2021-04-28 NOTE — Telephone Encounter (Signed)
Per Krystal Payne:  Please let Krystal Payne know that I have reviewed her recent labs with Krystal Payne.  Notably, her LDL is still above 100.  Given her history of coronary artery calcification and possible mild ischemia on her stress test, I would suggest that we escalate her statin therapy.  If she is agreeable, I would recommend switching simvastatin to rosuvastatin 20 mg daily with a follow-up lipid panel and ALT in about 3 months.   Called and spoke to pt, notified of results and Krystal Payne recc. Pt agrees to changed from Simvastatin to Rosuvastatin 20mg  daily.  She will have lipid panel and ALT in 3 months.  Rx sent to local pharmacy 30 day supply per pt request until received 3 month lab results.  Will place reminder in pt's MyChart for lab work per request as well.  Pt has no further questions at this time.

## 2021-05-25 ENCOUNTER — Other Ambulatory Visit: Payer: Self-pay

## 2021-05-25 ENCOUNTER — Ambulatory Visit
Admission: RE | Admit: 2021-05-25 | Discharge: 2021-05-25 | Disposition: A | Discharge status: Home / Self Care | Payer: Medicare HMO | Source: Ambulatory Visit | Attending: Family Medicine | Admitting: Family Medicine

## 2021-05-25 DIAGNOSIS — Z1231 Encounter for screening mammogram for malignant neoplasm of breast: Secondary | ICD-10-CM

## 2021-05-31 ENCOUNTER — Ambulatory Visit: Payer: Medicare HMO | Admitting: Dermatology

## 2021-06-03 ENCOUNTER — Other Ambulatory Visit: Payer: Self-pay

## 2021-06-03 ENCOUNTER — Encounter: Payer: Self-pay | Admitting: Internal Medicine

## 2021-06-03 ENCOUNTER — Ambulatory Visit: Payer: Medicare HMO | Admitting: Internal Medicine

## 2021-06-03 VITALS — BP 94/70 | HR 83 | Ht 65.5 in | Wt 181.0 lb

## 2021-06-03 DIAGNOSIS — R9439 Abnormal result of other cardiovascular function study: Secondary | ICD-10-CM

## 2021-06-03 DIAGNOSIS — I471 Supraventricular tachycardia: Secondary | ICD-10-CM | POA: Diagnosis not present

## 2021-06-03 DIAGNOSIS — E785 Hyperlipidemia, unspecified: Secondary | ICD-10-CM

## 2021-06-03 DIAGNOSIS — Z79899 Other long term (current) drug therapy: Secondary | ICD-10-CM

## 2021-06-03 NOTE — Patient Instructions (Signed)
Medication Instructions:   Your physician recommends that you continue on your current medications as directed. Please refer to the Current Medication list given to you today.  *If you need a refill on your cardiac medications before your next appointment, please call your pharmacy*   Lab Work:  -  Your physician recommends that you return FASTING for lab work at the [facility] at [facility] (Lipid panel, ALT) -  Please have labs the morning of your upcoming Echocardiogram -  Please go to the [facility]. You will check in at the front desk to the right as you walk into the atrium. Valet Parking is offered if needed. - No appointment needed. You may go any day between 7 am and 6 pm.    Testing/Procedures:  Your physician has requested that you have an echocardiogram (please schedule in the morning). Echocardiography is a painless test that uses sound waves to create images of your heart. It provides your doctor with information about the size and shape of your heart and how well your heart's chambers and valves are working. This procedure takes approximately one hour. There are no restrictions for this procedure.    Follow-Up: At [facility], you and your health needs are our priority.  As part of our continuing mission to provide you with exceptional heart care, we have created designated Provider Care Teams.  These Care Teams include your primary Cardiologist (physician) and Advanced Practice Providers (APPs -  Physician Assistants and Nurse Practitioners) who all work together to provide you with the care you need, when you need it.  We recommend signing up for the patient portal called "MyChart".  Sign up information is provided on this After Visit Summary.  MyChart is used to connect with patients for Virtual Visits (Telemedicine).  Patients are able to view lab/test results, encounter notes, upcoming appointments, etc.  Non-urgent messages can be sent to your provider as well.    To learn more about what you can do with MyChart, go to [email or web address].    Your next appointment:   6 month(s)  The format for your next appointment:   In Person  Provider:   You may see Nelva Bush, MD or one of the following Advanced Practice Providers on your designated Care Team:   Murray Hodgkins, NP Christell Faith, PA-C Marrianne Mood, PA-C [place]ence [place], [place]

## 2021-06-03 NOTE — Progress Notes (Signed)
Follow-up Outpatient Visit Date: 06/03/2021  Primary Care Provider: Juluis Pitch, MD [address] [address]  Chief Complaint: Follow-up SVT  HPI:  Ms. Ehrenfeld is a 75 y.o. female with history of left breast cancer status postlumpectomy and radiation as well as hyperlipidemia, who presents for follow-up of palpitations.  I last saw her in April, at which time she reported occasional palpitations, usually when first lying down at night.  Event monitor showed sinus rhythm with occasional PACs as well as numerous episodes of PSVT lasting over 4 hours at times.  We agreed to add metoprolol succinate 12.5 mg daily.  Today, Ms. Gipe reports that her palpitations have been much better with addition of metoprolol.  She has only noticed one episode of palpitations.  She was a little woozy the first few days that she was on metoprolol, though this has resolved.  She denies chest pain, shortness of breath, lightheadedness, edema, and orthopnea.  She is also tolerating rosuvastatin well, which was started in place of simvastatin in late June.  --------------------------------------------------------------------------------------------------  Cardiovascular History & Procedures: Cardiovascular Problems: Chest pain SVT   Risk Factors: Hyperlipidemia, family history, and age > 5   Cath/PCI: None   CV Surgery: None   EP Procedures and Devices: 14-day event monitor (04/16/2021): Predominantly sinus rhythm with occasional PACs and rare PVCs.  Numerous (2182) episodes of SVT lasting up to 4:21 hours occurred.  Some episodes were irregular and PAF could not be entirely excluded.   Non-Invasive Evaluation(s): Pharmacologic MPI (05/06/2019): Abnormal, probably low risk study with small in size, mild in severity reversible apical inferior and apical later defect suggestive of mild ischemia but cannot rule out artifact.  LVEF >65%.  Mild coronary artery calcification present.  Recent CV  Pertinent Labs: Lab Results  Component Value Date   CHOL 178 08/21/2019   HDL 63 08/21/2019   LDLCALC 96 08/21/2019   LDLDIRECT 95 08/21/2019   TRIG 106 08/21/2019   CHOLHDL 2.8 08/21/2019   CHOLHDL 3.5 04/25/2019   K 4.1 04/25/2019   K 4.6 04/24/2017   BUN 21 04/25/2019   BUN 15.0 04/24/2017   CREATININE 0.84 04/25/2019   CREATININE 0.8 04/24/2017    Past medical and surgical history were reviewed and updated in EPIC.  Current Meds  Medication Sig   aspirin EC 81 MG tablet Take 1 tablet (81 mg total) by mouth daily.   cyclobenzaprine (FLEXERIL) 5 MG tablet Take 1 tablet by mouth daily as needed.   hydrocortisone 2.5 % cream Apply topically as directed. Qd up to 5 days a week to scaly areas around nose and temples prn flares   metoprolol succinate (TOPROL XL) 25 MG 24 hr tablet Take 0.5 tablets (12.5 mg total) by mouth daily.   oxybutynin (DITROPAN) 5 MG tablet Take 5 mg by mouth daily.   rosuvastatin (CRESTOR) 20 MG tablet Take 1 tablet (20 mg total) by mouth daily.    Allergies: Patient has no known allergies.  Social History   Tobacco Use   Smoking status: Former    Packs/day: 0.25    Years: 2.00    Pack years: 0.50    Types: Cigarettes    Quit date: 10/31/1968    Years since quitting: 52.6   Smokeless tobacco: Never  Vaping Use   Vaping Use: Never used  Substance Use Topics   Alcohol use: Yes    Alcohol/week: 1.0 standard drink    Types: 1 Glasses of wine per week  Comment: weekly   Drug use: No    Family History  Problem Relation Age of Onset   Heart disease Mother    Diabetes Mother    Cancer Brother        colon//under treatment   Bladder Cancer Brother    Cancer Maternal Aunt        breast/dx 64 years ago/currently 19   Alzheimer's disease Father    COPD Brother    Heart disease Brother    Heart disease Brother    Heart attack Brother    Lung disease Brother    Arrhythmia Sister    Breast cancer Other 67   Cancer Brother    Heart disease  Sister    Heart attack Sister    Stroke Sister    Stroke Brother    Kidney cancer Neg Hx    Prostate cancer Neg Hx     Review of Systems: A 12-system review of systems was performed and was negative except as noted in the HPI.  --------------------------------------------------------------------------------------------------  Physical Exam: BP 94/70 (BP Location: Left Arm, Patient Position: Sitting, Cuff Size: Large)   Pulse 83   Ht 5' 5.5" (1.664 m)   Wt 181 lb (82.1 kg)   SpO2 96%   BMI 29.66 kg/m   General:  NAD. Neck: No JVD or HJR. Lungs: Clear to auscultation bilaterally without wheezes or crackles. Heart: Regular rate and rhythm without murmurs, rubs, or gallops. Abdomen: Soft, nontender, nondistended. Extremities: No lower extremity edema.  EKG:  NSR with PAC's and left axis deviation.  PAC is new since 02/26/2021.  Otherwise, no significant interval change.  Lab Results  Component Value Date   WBC 6.3 04/25/2019   HGB 13.9 04/25/2019   HCT 42.8 04/25/2019   MCV 94.1 04/25/2019   PLT 284 04/25/2019    Lab Results  Component Value Date   NA 141 04/25/2019   K 4.1 04/25/2019   CL 107 04/25/2019   CO2 26 04/25/2019   BUN 21 04/25/2019   CREATININE 0.84 04/25/2019   GLUCOSE 95 04/25/2019   ALT 13 08/21/2019    Lab Results  Component Value Date   CHOL 178 08/21/2019   HDL 63 08/21/2019   LDLCALC 96 08/21/2019   LDLDIRECT 95 08/21/2019   TRIG 106 08/21/2019   CHOLHDL 2.8 08/21/2019    --------------------------------------------------------------------------------------------------  ASSESSMENT AND PLAN: SVT: Significant PSVT lasting over 4 hours at a time noted on event monitor.  Palpitations have improved with addition of low-dose metoprolol succinate.  Borderline blood pressure (asymptomatic) precludes further dose escalation.  We will arrange for an echocardiogram to exclude underlying structural abnormalities.  No medication changes recommended  at this time.  Abnormal stress test: No chest pain or dyspnea reported.  Continue medical therapy for small apical defect that could represent ischemia or artifact.  Hyperlipidemia: Ms. Wiita is tolerating rosuvastatin 20 mg daily well.  We will check a lipid panel and ALT when she returns for her echocardiogram to ensure that her LDL is at goal (LDL < 70).  Follow-up: Return to clinic in 6 months.  Nelva Bush, MD 06/03/2021 9:12 AM

## 2021-06-08 NOTE — Telephone Encounter (Signed)
Note created in error.

## 2021-06-09 ENCOUNTER — Other Ambulatory Visit: Payer: Self-pay

## 2021-06-09 ENCOUNTER — Other Ambulatory Visit
Admission: RE | Admit: 2021-06-09 | Discharge: 2021-06-09 | Disposition: A | Discharge status: Home / Self Care | Payer: Medicare HMO | Source: Ambulatory Visit | Attending: Internal Medicine | Admitting: Internal Medicine

## 2021-06-09 ENCOUNTER — Ambulatory Visit (INDEPENDENT_AMBULATORY_CARE_PROVIDER_SITE_OTHER): Payer: Medicare HMO

## 2021-06-09 DIAGNOSIS — Z79899 Other long term (current) drug therapy: Secondary | ICD-10-CM | POA: Insufficient documentation

## 2021-06-09 DIAGNOSIS — E785 Hyperlipidemia, unspecified: Secondary | ICD-10-CM | POA: Insufficient documentation

## 2021-06-09 DIAGNOSIS — I471 Supraventricular tachycardia: Secondary | ICD-10-CM

## 2021-06-09 LAB — LIPID PANEL
Cholesterol: 162 mg/dL (ref 0–200)
HDL: 58 mg/dL (ref >40)
LDL Cholesterol: 86 mg/dL (ref 0–99)
Total CHOL/HDL Ratio: 2.8 RATIO
Triglycerides: 91 mg/dL (ref <150)
VLDL: 18 mg/dL (ref 0–40)

## 2021-06-09 LAB — ALT: ALT: 16 U/L (ref 0–44)

## 2021-06-09 MED ORDER — PERFLUTREN LIPID MICROSPHERE
1.0000 mL | INTRAVENOUS | Status: AC | PRN
  Administered 2021-06-09: 2 mL via INTRAVENOUS

## 2021-06-10 LAB — ECHOCARDIOGRAM COMPLETE
AR max vel: 2.98 cm2
AV Area VTI: 3.12 cm2
AV Area mean vel: 2.85 cm2
AV Mean grad: 2 mmHg
AV Peak grad: 4.2 mmHg
Ao pk vel: 1.02 m/s
Area-P 1/2: 2.6 cm2
Calc EF: 55.7 %
S' Lateral: 3.5 cm
Single Plane A2C EF: 52.2 %
Single Plane A4C EF: 57.7 %

## 2021-06-11 ENCOUNTER — Telehealth: Payer: Self-pay | Admitting: *Deleted

## 2021-06-11 NOTE — Telephone Encounter (Signed)
Spoke to pt, notified of lab results and Dr. Darnelle Bos recc.  Pt expresses concern with "losing clumps of hair, and I think it's related to my statin."   Pt reports when she was on Simvastatin 20 mg she had no issues with hair loss.  Then Simvastatin was incr to 40 mg, she noticed clumps of hair falling out.  Pt (on her own) reduced Simvastatin to 30 mg daily and "hair loss improved." Now pt has been changed to Rosuvastatin 20 mg she reports 2 episodes of clumps of hair falling out again.  Pt wanted to discuss this prior to recc today of incr Rosuvastatin to 40 mg.  Pt also reports incr fatigue as well and is not sure if this is related to Rosuvastatin.   Notified pt I will make Dr. Saunders Revel aware and will call her back with his recc.

## 2021-06-11 NOTE — Telephone Encounter (Signed)
-----   Message from Nelva Bush, MD sent at 06/09/2021 10:03 PM EDT ----- Please let Krystal Payne know that her LDL is a little bit above goal at 86 (target less than 70).  I suggest that we increase rosuvastatin to 40 mg daily, with plans to repeat a lipid panel and ALT in about 3 months.

## 2021-06-15 NOTE — Telephone Encounter (Signed)
Spoke to pt, notified of Dr. Darnelle Bos recc below.  Pt agrees to statin holiday for 1 month.  Will follow up with pt in a month re symptoms.  Pt voiced understanding and has no further questions at this time.

## 2021-06-15 NOTE — Telephone Encounter (Signed)
I recommend that we give Ms. Blacklock a statin holiday and have her hold rosuvastatin for a month.  If her hair loss improves, we will need to consider lower dose statin therapy or alternative interventions.  If hair loss persists, we would be more aggressive with statin therapy and have Ms. Oshea see her PCP for further evaluation of her alopecia.  Nelva Bush, MD [facility]

## 2021-07-27 ENCOUNTER — Ambulatory Visit: Payer: Medicare HMO | Admitting: Dermatology

## 2021-07-27 ENCOUNTER — Other Ambulatory Visit
Admission: RE | Admit: 2021-07-27 | Discharge: 2021-07-27 | Disposition: A | Discharge status: Home / Self Care | Payer: Medicare HMO | Attending: Internal Medicine | Admitting: Internal Medicine

## 2021-07-27 ENCOUNTER — Other Ambulatory Visit: Payer: Self-pay

## 2021-07-27 DIAGNOSIS — Z85828 Personal history of other malignant neoplasm of skin: Secondary | ICD-10-CM

## 2021-07-27 DIAGNOSIS — L738 Other specified follicular disorders: Secondary | ICD-10-CM

## 2021-07-27 DIAGNOSIS — E785 Hyperlipidemia, unspecified: Secondary | ICD-10-CM | POA: Insufficient documentation

## 2021-07-27 DIAGNOSIS — L719 Rosacea, unspecified: Secondary | ICD-10-CM

## 2021-07-27 DIAGNOSIS — L219 Seborrheic dermatitis, unspecified: Secondary | ICD-10-CM

## 2021-07-27 DIAGNOSIS — L820 Inflamed seborrheic keratosis: Secondary | ICD-10-CM | POA: Diagnosis not present

## 2021-07-27 DIAGNOSIS — L821 Other seborrheic keratosis: Secondary | ICD-10-CM

## 2021-07-27 DIAGNOSIS — Z872 Personal history of diseases of the skin and subcutaneous tissue: Secondary | ICD-10-CM | POA: Diagnosis not present

## 2021-07-27 DIAGNOSIS — E7800 Pure hypercholesterolemia, unspecified: Secondary | ICD-10-CM | POA: Insufficient documentation

## 2021-07-27 DIAGNOSIS — L814 Other melanin hyperpigmentation: Secondary | ICD-10-CM

## 2021-07-27 DIAGNOSIS — Z79899 Other long term (current) drug therapy: Secondary | ICD-10-CM | POA: Diagnosis present

## 2021-07-27 DIAGNOSIS — L578 Other skin changes due to chronic exposure to nonionizing radiation: Secondary | ICD-10-CM

## 2021-07-27 LAB — LIPID PANEL
Cholesterol: 260 mg/dL — ABNORMAL HIGH (ref 0–200)
HDL: 65 mg/dL (ref >40)
LDL Cholesterol: 178 mg/dL — ABNORMAL HIGH (ref 0–99)
Total CHOL/HDL Ratio: 4 RATIO
Triglycerides: 83 mg/dL (ref <150)
VLDL: 17 mg/dL (ref 0–40)

## 2021-07-27 LAB — ALT: ALT: 16 U/L (ref 0–44)

## 2021-07-27 MED ORDER — METRONIDAZOLE 0.75 % EX CREA: TOPICAL_CREAM | CUTANEOUS | 3 refills | Status: DC

## 2021-07-27 NOTE — Progress Notes (Signed)
Follow-Up Visit   Subjective  Krystal Payne is a 75 y.o. female who presents for the following: Follow-up (Patient here for 3 month follow-up. She has a history of multiple BCCs and history of Aks. She has a pink area on her left chin that came up 3 months ago. There was a raised area at one point, but it went back down. No symptoms. ). She also has an itchy spot on her back at the braline.  She also uses ketoconazole cream for [facility].   The following portions of the chart were reviewed this encounter and updated as appropriate:       Review of Systems:  No other skin or systemic complaints except as noted in HPI or Assessment and Plan.  Objective  Well appearing patient in no apparent distress; mood and affect are within normal limits.  A focused examination was performed including face, chest, back, arms. Relevant physical exam findings are noted in the Assessment and Plan.  face Erythema with sebaceous hypertrophy and telangiectasias on the chin; telangiectasias of the malar cheeks  Forehead Pink scaliness of the glabella and nasal alar creases.  L mid back at braline x 1 Erythematous keratotic or waxy stuck-on papule or plaque.    Assessment & Plan  Actinic Damage - chronic, secondary to cumulative UV radiation exposure/sun exposure over time - diffuse scaly erythematous macules with underlying dyspigmentation - Recommend daily broad spectrum sunscreen SPF 30+ to sun-exposed areas, reapply every 2 hours as needed.  - Recommend staying in the shade or wearing long sleeves, sun glasses (UVA+UVB protection) and wide brim hats (4-inch brim around the entire circumference of the hat). - Call for new or changing lesions.  Rosacea face  Rosacea is a chronic progressive skin condition usually affecting the face of adults, causing redness and/or acne bumps. It is treatable but not curable. It sometimes affects the eyes (ocular rosacea) as well. It may respond to topical  and/or systemic medication and can flare with stress, sun exposure, alcohol, exercise and some foods.  Daily application of broad spectrum spf 30+ sunscreen to face is recommended to reduce flares.  Start metronidazole 0.75% cream Apply to AA face qd/bid dsp 45g 3Rf.  metroNIDAZOLE (METROCREAM) 0.75 % cream - face Apply to affected areas face 1-2 times a day for rosacea.  Seborrheic dermatitis Forehead  Seborrheic Dermatitis  -  is a chronic persistent rash characterized by pinkness and scaling most commonly of the mid face but also can occur on the scalp (dandruff), ears; mid chest, mid back and groin.  It tends to be exacerbated by stress and cooler weather.  People who have neurologic disease may experience new onset or exacerbation of existing seborrheic dermatitis.  The condition is not curable but treatable and can be controlled.  Continue ketoconazole 2% cream qd/bid as directed. Continue hydrocortisone 2.5% cream qd/bid as directed.   Related Medications hydrocortisone 2.5 % cream Apply topically as directed. Qd up to 5 days a week to scaly areas around nose and temples prn flares  Inflamed seborrheic keratosis L mid back at braline x 1  Destruction of lesion - L mid back at braline x 1  Destruction method: cryotherapy   Informed consent: discussed and consent obtained   Lesion destroyed using liquid nitrogen: Yes   Region frozen until ice ball extended beyond lesion: Yes   Outcome: patient tolerated procedure well with no complications   Post-procedure details: wound care instructions given   Additional details:  Prior  to procedure, discussed risks of blister formation, small wound, skin dyspigmentation, or rare scar following cryotherapy. Recommend Vaseline ointment to treated areas while healing.  History of Basal Cell Carcinoma of the Skin - No evidence of recurrence today - Recommend regular full body skin exams - Recommend daily broad spectrum sunscreen SPF 30+ to  sun-exposed areas, reapply every 2 hours as needed.  - Call if any new or changing lesions are noted between office visits  Seborrheic Keratoses - Stuck-on, waxy, tan-brown papules and/or plaques  - Benign-appearing - Discussed benign etiology and prognosis. - Observe - Call for any changes  Sebaceous Hyperplasia - Small yellow papules with a central dell - Benign - Observe  Lentigines - Scattered tan macules - Due to sun exposure - Benign-appering, observe - Recommend daily broad spectrum sunscreen SPF 30+ to sun-exposed areas, reapply every 2 hours as needed. - Call for any changes   Return in about 6 months (around 01/24/2022) for TBSE.  IJamesetta Orleans, CMA, am acting as scribe for Brendolyn Patty, MD .  Documentation: I have reviewed the above documentation for accuracy and completeness, and I agree with the above.  Brendolyn Patty MD

## 2021-07-27 NOTE — Patient Instructions (Addendum)
Cryotherapy Aftercare  Wash gently with soap and water everyday.   Apply Vaseline and Band-Aid daily until healed.    Seborrheic Dermatitis  Mix hydrocortisone with ketaconazole 2% twice a day. If improved, decrease to hydrocortisone and ketaconazole mixed once a day. If still clear, decrease to ketaconazole only.    If you have any questions or concerns for your doctor, please call our main line at [phone] and press option 4 to reach your doctor's medical assistant. If no one answers, please leave a voicemail as directed and we will return your call as soon as possible. Messages left after 4 pm will be answered the following business day.   You may also send [place] a message via [place]. We typically respond to MyChart messages within 1-2 business days.  For prescription refills, please ask your pharmacy to contact our office. Our fax number is [phone].  If you have an urgent issue when the clinic is closed that cannot wait until the next business day, you can page your doctor at the number below.    Please note that while we do our best to be available for urgent issues outside of office hours, we are not available 24/7.   If you have an urgent issue and are unable to reach [place], you may choose to seek medical care at your doctor's office, retail clinic, urgent care center, or emergency room.  If you have a medical emergency, please immediately call 911 or go to the emergency department.  Pager Numbers  - Dr. Nehemiah Massed: [phone]  - Dr. Laurence Ferrari: [phone]  - Dr. Nicole Kindred: [phone]  In the event of inclement weather, please call our main line at [phone] for an update on the status of any delays or closures.  Dermatology Medication Tips: Please keep the boxes that topical medications come in in order to help keep track of the instructions about where and how to use these. Pharmacies typically print the medication instructions only on the boxes and not directly on  the medication tubes.   If your medication is too expensive, please contact our office at [phone] option 4 or send [place] a message through [place].   We are unable to tell what your co-pay for medications will be in advance as this is different depending on your insurance coverage. However, we may be able to find a substitute medication at lower cost or fill out paperwork to get insurance to cover a needed medication.   If a prior authorization is required to get your medication covered by your insurance company, please allow [place] 1-2 business days to complete this process.  Drug prices often vary depending on where the prescription is filled and some pharmacies may offer cheaper prices.  The website www.goodrx.com contains coupons for medications through different pharmacies. The prices here do not account for what the cost may be with help from insurance (it may be cheaper with your insurance), but the website can give you the price if you did not use any insurance.  - You can print the associated coupon and take it with your prescription to the pharmacy.  - You may also stop by our office during regular business hours and pick up a GoodRx coupon card.  - If you need your prescription sent electronically to a different pharmacy, notify our office through [facility] or by phone at [phone] option 4.

## 2021-07-29 ENCOUNTER — Telehealth: Payer: Self-pay | Admitting: *Deleted

## 2021-07-29 NOTE — Telephone Encounter (Signed)
Reviewed results and recommendations with patient. She states that she was having such fatigue and no energy since starting medication. She reports being off Metoprolol for around 20 days and off Rosuvastatin for 30 days. Patient states that her heart has not raced since stopping medication and she wants to know if she can take Metoprolol as needed. She also stated that she tolerated Simvastatin better. Advised I can send this information to provider for his review and if he should have any recommendations based on her updates. She verbalized understanding of our conversation with no further questions at this time.

## 2021-07-29 NOTE — Telephone Encounter (Signed)
Spoke with patient and reviewed provider recommendations to come in for discussing about medications and changes. Provided with date and time for next week with Dr. Saunders Revel. She was agreeable with appointment and had no further questions at this time.

## 2021-07-29 NOTE — Telephone Encounter (Signed)
-----   Message from Nelva Bush, MD sent at 07/28/2021  9:15 AM EDT ----- Please let [name] know that her cholesterol has increased significantly over the last month with a statin holiday.  If her hair loss has not changed significantly with holding rosuvastatin, I suggest she go back on rosuvastatin 40 mg daily.

## 2021-07-29 NOTE — Telephone Encounter (Signed)
I recommend that we make arrangements for Ms. Brunke to see me or an APP to discuss her medication concerns further.  Given her frequent runs of SVT noted on monitor, I am not sure that a prn medication would be effective.  It may be best to explore other options (alternative beta blocker or a calcium channel blocker) in person.  We can also readdress statin therapy, though her LDL was not at goal in the past when on simvastatin.  She can hold both medications until she is seen in the office in the next week or two.  Nelva Bush, MD [facility]

## 2021-08-05 ENCOUNTER — Other Ambulatory Visit: Payer: Self-pay

## 2021-08-05 ENCOUNTER — Ambulatory Visit: Payer: Medicare HMO | Admitting: Internal Medicine

## 2021-08-05 ENCOUNTER — Encounter: Payer: Self-pay | Admitting: Internal Medicine

## 2021-08-05 VITALS — BP 120/80 | HR 96 | Ht 65.0 in | Wt 174.0 lb

## 2021-08-05 DIAGNOSIS — I471 Supraventricular tachycardia: Secondary | ICD-10-CM

## 2021-08-05 DIAGNOSIS — E7800 Pure hypercholesterolemia, unspecified: Secondary | ICD-10-CM

## 2021-08-05 DIAGNOSIS — I491 Atrial premature depolarization: Secondary | ICD-10-CM | POA: Diagnosis not present

## 2021-08-05 DIAGNOSIS — I2510 Atherosclerotic heart disease of native coronary artery without angina pectoris: Secondary | ICD-10-CM | POA: Insufficient documentation

## 2021-08-05 DIAGNOSIS — Z79899 Other long term (current) drug therapy: Secondary | ICD-10-CM

## 2021-08-05 HISTORY — DX: Atherosclerotic heart disease of native coronary artery without angina pectoris: I25.10

## 2021-08-05 MED ORDER — SIMVASTATIN 20 MG PO TABS: 30.0000 mg | ORAL_TABLET | Freq: Every day | ORAL | 3 refills | Status: DC

## 2021-08-05 MED ORDER — BISOPROLOL FUMARATE 5 MG PO TABS: 2.5000 mg | ORAL_TABLET | Freq: Every day | ORAL | 1 refills | Status: DC

## 2021-08-05 NOTE — Patient Instructions (Signed)
Medication Instructions:   Your physician has recommended you make the following change in your medication:   RESTART Simvastatin 30 mg daily   START Bisoprolol 2.5 mg daily   *If you need a refill on your cardiac medications before your next appointment, please call your pharmacy*   Lab Work:  Your physician recommends that you return for FASTING lab work in: 3 MONTHS (Lipid panel / ALT)  Testing/Procedures:  None ordered   Follow-Up: At [facility], you and your health needs are our priority.  As part of our continuing mission to provide you with exceptional heart care, we have created designated Provider Care Teams.  These Care Teams include your primary Cardiologist (physician) and Advanced Practice Providers (APPs -  Physician Assistants and Nurse Practitioners) who all work together to provide you with the care you need, when you need it.  We recommend signing up for the patient portal called "MyChart".  Sign up information is provided on this After Visit Summary.  MyChart is used to connect with patients for Virtual Visits (Telemedicine).  Patients are able to view lab/test results, encounter notes, upcoming appointments, etc.  Non-urgent messages can be sent to your provider as well.   To learn more about what you can do with MyChart, go to [email or web address].    Your next appointment:   3 month(s) (Shortly after 3 month labs)  The format for your next appointment:   In Person  Provider:   You may see Nelva Bush, MD or one of the following Advanced Practice Providers on your designated Care Team:   Murray Hodgkins, NP Christell Faith, PA-C Marrianne Mood, PA-C Cadence Columbia, [place]

## 2021-08-05 NOTE — Progress Notes (Signed)
Follow-up Outpatient Visit Date: 08/05/2021  Primary Care Provider: Juluis Pitch, MD [address] [address]  Chief Complaint: Follow-up hyperlipidemia and supraventricular tachycardia  HPI:  Ms. Canales is a 75 y.o. female with history of PSVT, coronary artery calcification with mild perfusion defect (favor small area of ischemia versus artifact).  Hyperlipidemia, and left breast cancer status postlumpectomy and radiation, who presents for follow-up of hyperlipidemia and PSVT.  I last saw her in early August, at which time Ms. Honold reported significant improvement in palpitations with metoprolol.  She reported tolerating metoprolol and atorvastatin well.  However, her LDL was above goal at 86, prompting [place] to increase rosuvastatin to 40 mg daily.  Ms. Burgueno reported hair loss that she attributed to rosuvastatin.  We therefore undertook a statin holiday that led to a marked rise in LDL (86->178).  She returns today to discuss further medical treatment of her hyperlipidemia and the study of mildly abnormal MPI in 2020 with associated coronary artery calcification.  Today, Ms. Gura reports that she is actually feeling better after coming off of simvastatin and metoprolol.  Her hair loss has stopped.  She also feels more energetic.  She has not had any chest pain, shortness of breath, palpitations, lightheadedness, or edema.  She has been trying to lose weight with the keto diet and happily reports that she is down 9 pounds.  --------------------------------------------------------------------------------------------------  Cardiovascular History & Procedures: Cardiovascular Problems: Chest pain SVT   Risk Factors: Hyperlipidemia, family history, and age > 34   Cath/PCI: None   CV Surgery: None   EP Procedures and Devices: 14-day event monitor (04/16/2021): Predominantly sinus rhythm with occasional PACs and rare PVCs.  Numerous (2182) episodes of SVT lasting up to 4:21  hours occurred.  Some episodes were irregular and PAF could not be entirely excluded.   Non-Invasive Evaluation(s): Pharmacologic MPI (05/06/2019): Abnormal, probably low risk study with small in size, mild in severity reversible apical inferior and apical later defect suggestive of mild ischemia but cannot rule out artifact.  LVEF >65%.  Mild coronary artery calcification present.  Recent CV Pertinent Labs: Lab Results  Component Value Date   CHOL 260 (H) 07/27/2021   CHOL 178 08/21/2019   HDL 65 07/27/2021   HDL 63 08/21/2019   LDLCALC 178 (H) 07/27/2021   LDLCALC 96 08/21/2019   LDLDIRECT 95 08/21/2019   TRIG 83 07/27/2021   CHOLHDL 4.0 07/27/2021   K 4.1 04/25/2019   K 4.6 04/24/2017   BUN 21 04/25/2019   BUN 15.0 04/24/2017   CREATININE 0.84 04/25/2019   CREATININE 0.8 04/24/2017    Past medical and surgical history were reviewed and updated in EPIC.  Current Meds  Medication Sig   aspirin EC 81 MG tablet Take 1 tablet (81 mg total) by mouth daily.   cyclobenzaprine (FLEXERIL) 5 MG tablet Take 1 tablet by mouth daily as needed.   hydrocortisone 2.5 % cream Apply topically as directed. Qd up to 5 days a week to scaly areas around nose and temples prn flares   metroNIDAZOLE (METROCREAM) 0.75 % cream Apply to affected areas face 1-2 times a day for rosacea.   oxybutynin (DITROPAN) 5 MG tablet Take 5 mg by mouth daily.    Allergies: Patient has no known allergies.  Social History   Tobacco Use   Smoking status: Former    Packs/day: 0.25    Years: 2.00    Pack years: 0.50    Types: Cigarettes  Quit date: 10/31/1968    Years since quitting: 52.7   Smokeless tobacco: Never  Vaping Use   Vaping Use: Never used  Substance Use Topics   Alcohol use: Not Currently    Alcohol/week: 1.0 standard drink    Types: 1 Glasses of wine per week    Comment: weekly   Drug use: No    Family History  Problem Relation Age of Onset   Heart disease Mother    Diabetes Mother     Cancer Brother        colon//under treatment   Bladder Cancer Brother    Cancer Maternal Aunt        breast/dx 46 years ago/currently 23   Alzheimer's disease Father    COPD Brother    Heart disease Brother    Heart disease Brother    Heart attack Brother    Lung disease Brother    Arrhythmia Sister    Breast cancer Other 29   Cancer Brother    Heart disease Sister    Heart attack Sister    Stroke Sister    Stroke Brother    Kidney cancer Neg Hx    Prostate cancer Neg Hx     Review of Systems: A 12-system review of systems was performed and was negative except as noted in the HPI.  --------------------------------------------------------------------------------------------------  Physical Exam: BP 120/80 (BP Location: Left Arm, Patient Position: Sitting, Cuff Size: Large)   Pulse 96   Ht 5\' 5"  (1.651 m)   Wt 174 lb (78.9 kg)   SpO2 98%   BMI 28.96 kg/m   General:  NAD. Neck: No JVD or HJR. Lungs: Clear to auscultation bilaterally without wheezes or crackles. Heart: Regular rate rate and rhythm with occasional extrasystoles.  No rubs or gallops. Abdomen: Soft, nontender, nondistended. Extremities: No lower extremity edema.  EKG: Likely ectopic atrial rhythm with PACs and left axis deviation compared with prior tracing from 06/03/2021, ectopic atrial rhythm has replaced sinus rhythm.  Lab Results  Component Value Date   WBC 6.3 04/25/2019   HGB 13.9 04/25/2019   HCT 42.8 04/25/2019   MCV 94.1 04/25/2019   PLT 284 04/25/2019    Lab Results  Component Value Date   NA 141 04/25/2019   K 4.1 04/25/2019   CL 107 04/25/2019   CO2 26 04/25/2019   BUN 21 04/25/2019   CREATININE 0.84 04/25/2019   GLUCOSE 95 04/25/2019   ALT 16 07/27/2021    Lab Results  Component Value Date   CHOL 260 (H) 07/27/2021   HDL 65 07/27/2021   LDLCALC 178 (H) 07/27/2021   LDLDIRECT 95 08/21/2019   TRIG 83 07/27/2021   CHOLHDL 4.0 07/27/2021     --------------------------------------------------------------------------------------------------  ASSESSMENT AND PLAN: PSVT and ectopic atrial rhythm: Ms. Soltis overall feels well with discontinuation of metoprolol the prior event monitor showed very frequent episodes of PSVT.  Her EKG today also suggests ectopic atrial rhythm with PACs.  We have discussed the rationale behind pharmacotherapy and potential alternatives to metoprolol, including a different beta-blocker and calcium channel blocker.  Given the potential interaction between diltiazem and simvastatin, we have agreed to try bisoprolol 2.5 mg daily.  Hyperlipidemia: Ms. Santino reports cessation of hair loss with discontinuation of rosuvastatin (this also occurred when she took simvastatin 40 mg daily in the past).  She was doing well with simvastatin 30 mg daily though her LDL was not quite at goal.  We have agreed to rechallenge her with simvastatin 30 mg  daily and continue working on lifestyle modifications.  We will plan to repeat a lipid panel and ALT in 3 months.  Though I ideally would like to achieve an LDL less than 70, given her statin intolerance, I think it is reasonable to shoot for a goal of less than 100.  Coronary artery disease without angina: No chest pain reported with prior myocardial perfusion stress test showing small defect suspicious for low risk ischemia versus artifact.  Continue aspirin and statin therapy with addition of bisoprolol, as above.  Follow-up: Return to clinic in 3 months following labs.  Nelva Bush, MD 08/05/2021 11:52 AM

## 2021-10-11 ENCOUNTER — Other Ambulatory Visit: Payer: Self-pay

## 2021-10-11 ENCOUNTER — Other Ambulatory Visit (INDEPENDENT_AMBULATORY_CARE_PROVIDER_SITE_OTHER): Payer: Self-pay | Admitting: Nurse Practitioner

## 2021-10-11 ENCOUNTER — Ambulatory Visit (INDEPENDENT_AMBULATORY_CARE_PROVIDER_SITE_OTHER): Payer: Medicare HMO | Admitting: Vascular Surgery

## 2021-10-11 ENCOUNTER — Encounter (INDEPENDENT_AMBULATORY_CARE_PROVIDER_SITE_OTHER): Payer: Self-pay | Admitting: Vascular Surgery

## 2021-10-11 ENCOUNTER — Ambulatory Visit (INDEPENDENT_AMBULATORY_CARE_PROVIDER_SITE_OTHER): Payer: Medicare HMO

## 2021-10-11 VITALS — BP 116/72 | HR 68 | Ht 65.0 in | Wt 172.0 lb

## 2021-10-11 DIAGNOSIS — Z860100 Personal history of colon polyps, unspecified: Secondary | ICD-10-CM | POA: Insufficient documentation

## 2021-10-11 DIAGNOSIS — Z8619 Personal history of other infectious and parasitic diseases: Secondary | ICD-10-CM | POA: Insufficient documentation

## 2021-10-11 DIAGNOSIS — I2510 Atherosclerotic heart disease of native coronary artery without angina pectoris: Secondary | ICD-10-CM

## 2021-10-11 DIAGNOSIS — I739 Peripheral vascular disease, unspecified: Secondary | ICD-10-CM | POA: Diagnosis not present

## 2021-10-11 DIAGNOSIS — M79604 Pain in right leg: Secondary | ICD-10-CM

## 2021-10-11 DIAGNOSIS — M79605 Pain in left leg: Secondary | ICD-10-CM

## 2021-10-11 DIAGNOSIS — N3281 Overactive bladder: Secondary | ICD-10-CM | POA: Insufficient documentation

## 2021-10-11 DIAGNOSIS — I70219 Atherosclerosis of native arteries of extremities with intermittent claudication, unspecified extremity: Secondary | ICD-10-CM | POA: Insufficient documentation

## 2021-10-11 DIAGNOSIS — I70213 Atherosclerosis of native arteries of extremities with intermittent claudication, bilateral legs: Secondary | ICD-10-CM

## 2021-10-11 DIAGNOSIS — E7800 Pure hypercholesterolemia, unspecified: Secondary | ICD-10-CM

## 2021-10-11 DIAGNOSIS — C4491 Basal cell carcinoma of skin, unspecified: Secondary | ICD-10-CM | POA: Insufficient documentation

## 2021-10-11 NOTE — Progress Notes (Signed)
MRN : [id number]  Krystal Payne is a 75 y.o. (Apr 23, 1946) female who presents with chief complaint of leg pain.  History of Present Illness:  The patient is seen for evaluation of painful lower extremities. Patient notes the pain is variable and not always associated with activity.  The pain is somewhat consistent day to day occurring on most days. The patient notes the pain also occurs with standing and routinely seems worse as the day wears on. The pain has been progressive over the past several years. The patient states these symptoms are causing  a profound negative impact on quality of life and daily activities.  The patient denies rest pain or dangling of an extremity off the side of the bed during the night for relief. No open wounds or sores at this time. No history of DVT or phlebitis. No prior interventions or surgeries.  There is no history of back problems and DJD of the lumbar and sacral spine.      ABI's are normal bilaterally  No outpatient medications have been marked as taking for the 10/11/21 encounter (Appointment) with Delana Meyer, Dolores Lory, MD.    Past Medical History:  Diagnosis Date   Actinic keratosis    Arm fracture, left 1969   Basal cell carcinoma 05/11/2009   mid upper sternum   Basal cell carcinoma 12/10/2018   right upper nasolabial, excised 01/14/2019   Basal cell carcinoma 07/07/2020   Left mid upper back, ED[facility]   Basal cell carcinoma (BCC) of back 01/21/2020   L upper back, [facility]   Breast cancer ([place])    left breast   Coronary artery disease involving native coronary artery of native heart without angina pectoris 08/05/2021   History of radiation therapy 01/25/12 - 03/09/12   left breast   Hyperlipidemia    Personal history of radiation therapy 2013   Urgency of urination     Past Surgical History:  Procedure Laterality Date   BREAST BIOPSY Left 12/15/2011   BREAST LUMPECTOMY Left 01/02/2012   COLONOSCOPY WITH PROPOFOL N/A 11/01/2017    Procedure: COLONOSCOPY WITH PROPOFOL;  Surgeon: Toledo, Benay Pike, MD;  Location: [facility] ENDOSCOPY;  Service: Gastroenterology;  Laterality: N/A;   VAGINAL HYSTERECTOMY  1985    Social History Social History   Tobacco Use   Smoking status: Former    Packs/day: 0.25    Years: 2.00    Pack years: 0.50    Types: Cigarettes    Quit date: 10/31/1968    Years since quitting: 52.9   Smokeless tobacco: Never  Vaping Use   Vaping Use: Never used  Substance Use Topics   Alcohol use: Not Currently    Alcohol/week: 1.0 standard drink    Types: 1 Glasses of wine per week    Comment: weekly   Drug use: No    Family History Family History  Problem Relation Age of Onset   Heart disease Mother    Diabetes Mother    Cancer Brother        colon//under treatment   Bladder Cancer Brother    Cancer Maternal Aunt        breast/dx 47 years ago/currently 41   Alzheimer's disease Father    COPD Brother    Heart disease Brother    Heart disease Brother    Heart attack Brother    Lung disease Brother    Arrhythmia Sister    Breast cancer Other 31   Cancer Brother    Heart disease Sister  Heart attack Sister    Stroke Sister    Stroke Brother    Kidney cancer Neg Hx    Prostate cancer Neg Hx     No Known Allergies   REVIEW OF SYSTEMS (Negative unless checked)  Constitutional: [] Weight loss  [] Fever  [] Chills Cardiac: [] Chest pain   [] Chest pressure   [] Palpitations   [] Shortness of breath when laying flat   [] Shortness of breath with exertion. Vascular:  [x] Pain in legs with walking   [x] Pain in legs at rest  [] History of DVT   [] Phlebitis   [] Swelling in legs   [] Varicose veins   [] Non-healing ulcers Pulmonary:   [] Uses home oxygen   [] Productive cough   [] Hemoptysis   [] Wheeze  [] COPD   [] Asthma Neurologic:  [] Dizziness   [] Seizures   [] History of stroke   [] History of TIA  [] Aphasia   [] Vissual changes   [] Weakness or numbness in arm   [] Weakness or numbness in leg Musculoskeletal:    [] Joint swelling   [] Joint pain   [] Low back pain Hematologic:  [] Easy bruising  [] Easy bleeding   [] Hypercoagulable state   [] Anemic Gastrointestinal:  [] Diarrhea   [] Vomiting  [] Gastroesophageal reflux/heartburn   [] Difficulty swallowing. Genitourinary:  [] Chronic kidney disease   [] Difficult urination  [] Frequent urination   [] Blood in urine Skin:  [] Rashes   [] Ulcers  Psychological:  [] History of anxiety   []  History of major depression.  Physical Examination  There were no vitals filed for this visit. There is no height or weight on file to calculate BMI. Gen: WD/WN, NAD Head: [place]/AT, No temporalis wasting.  Ear/Nose/Throat: Hearing grossly intact, nares w/o erythema or drainage Eyes: PER, EOMI, sclera nonicteric.  Neck: Supple, no masses.  No bruit or JVD.  Pulmonary:  Good air movement, no audible wheezing, no use of accessory muscles.  Cardiac: RRR, normal S1, S2, no Murmurs. Vascular:   Vessel Right Left  Radial Palpable Palpable  PT Palpable Palpable  DP Palpable Palpable  Gastrointestinal: soft, non-distended. No guarding/no peritoneal signs.  Musculoskeletal: M/S 5/5 throughout.  No visible deformity.  Neurologic: CN 2-12 intact. Pain and light touch intact in extremities.  Symmetrical.  Speech is fluent. Motor exam as listed above. Psychiatric: Judgment intact, Mood & affect appropriate for pt's clinical situation. Dermatologic: No rashes or ulcers noted.  No changes consistent with cellulitis.   CBC Lab Results  Component Value Date   WBC 6.3 04/25/2019   HGB 13.9 04/25/2019   HCT 42.8 04/25/2019   MCV 94.1 04/25/2019   PLT 284 04/25/2019    BMET    Component Value Date/Time   NA 141 04/25/2019 1046   NA 142 04/24/2017 1020   K 4.1 04/25/2019 1046   K 4.6 04/24/2017 1020   CL 107 04/25/2019 1046   CL 106 04/01/2013 1031   CO2 26 04/25/2019 1046   CO2 26 04/24/2017 1020   GLUCOSE 95 04/25/2019 1046   GLUCOSE 98 04/24/2017 1020   GLUCOSE 100 (H)  04/01/2013 1031   BUN 21 04/25/2019 1046   BUN 15.0 04/24/2017 1020   CREATININE 0.84 04/25/2019 1046   CREATININE 0.8 04/24/2017 1020   CALCIUM 9.4 04/25/2019 1046   CALCIUM 9.9 04/24/2017 1020   GFRNONAA >60 04/25/2019 1046   GFRAA >60 04/25/2019 1046   CrCl cannot be calculated (Patient's most recent lab result is older than the maximum 21 days allowed.).  COAG No results found for: INR, PROTIME  Radiology No results found.   Assessment/Plan 1. Pain  in both lower extremities Recommend:  I do not find evidence of Vascular pathology that would explain the patient's symptoms  The patient has atypical pain symptoms for vascular disease  I do not find evidence of Vascular pathology that would explain the patient's symptoms and I suspect the patient is c/o pseudoclaudication.  Patient should have an evaluation of his LS spine which I defer to the primary service.  Noninvasive studies including venous ultrasound of the legs do not identify vascular problems  The patient should continue walking and begin a more formal exercise program. The patient should continue his antiplatelet therapy and aggressive treatment of the lipid abnormalities. The patient should begin wearing graduated compression socks 15-20 mmHg strength to control her mild edema.  Patient will follow-up with me on a PRN basis  Further work-up of her lower extremity pain is deferred to the primary service      2. PAD (peripheral artery disease) (HCC) Recommend:  I do not find evidence of life style limiting vascular disease. The patient specifically denies life style limitation.  Previous noninvasive studies including ABI's of the legs do not identify critical vascular problems.  The patient should continue walking and begin a more formal exercise program. The patient should continue his antiplatelet therapy and aggressive treatment of the lipid abnormalities.  The patient should begin wearing graduated  compression socks 15-20 mmHg strength to control her mild edema.  Patient will follow-up with me on a PRN basis   3. Coronary artery disease involving native coronary artery of native heart without angina pectoris Continue cardiac and antihypertensive medications as already ordered and reviewed, no changes at this time.  Continue statin as ordered and reviewed, no changes at this time  Nitrates PRN for chest pain   4. Pure hypercholesterolemia Continue statin as ordered and reviewed, no changes at this time     Hortencia Pilar, MD  10/11/2021 1:26 PM

## 2021-10-16 ENCOUNTER — Encounter (INDEPENDENT_AMBULATORY_CARE_PROVIDER_SITE_OTHER): Payer: Self-pay | Admitting: Vascular Surgery

## 2021-10-16 DIAGNOSIS — M25569 Pain in unspecified knee: Secondary | ICD-10-CM | POA: Insufficient documentation

## 2021-10-16 DIAGNOSIS — M79606 Pain in leg, unspecified: Secondary | ICD-10-CM | POA: Insufficient documentation

## 2021-10-16 DIAGNOSIS — I739 Peripheral vascular disease, unspecified: Secondary | ICD-10-CM | POA: Insufficient documentation

## 2021-11-01 ENCOUNTER — Encounter: Payer: Self-pay | Admitting: Internal Medicine

## 2021-11-10 ENCOUNTER — Other Ambulatory Visit: Payer: Medicare HMO

## 2021-11-10 ENCOUNTER — Other Ambulatory Visit
Admission: RE | Admit: 2021-11-10 | Discharge: 2021-11-10 | Disposition: A | Discharge status: Home / Self Care | Payer: Medicare HMO | Attending: Internal Medicine | Admitting: Internal Medicine

## 2021-11-10 DIAGNOSIS — E7800 Pure hypercholesterolemia, unspecified: Secondary | ICD-10-CM | POA: Insufficient documentation

## 2021-11-10 DIAGNOSIS — Z79899 Other long term (current) drug therapy: Secondary | ICD-10-CM | POA: Diagnosis present

## 2021-11-10 LAB — ALT: ALT: 13 U/L (ref 0–44)

## 2021-11-10 LAB — LIPID PANEL
Cholesterol: 190 mg/dL (ref 0–200)
HDL: 69 mg/dL (ref >40)
LDL Cholesterol: 107 mg/dL — ABNORMAL HIGH (ref 0–99)
Total CHOL/HDL Ratio: 2.8 RATIO
Triglycerides: 68 mg/dL (ref <150)
VLDL: 14 mg/dL (ref 0–40)

## 2021-11-17 NOTE — Progress Notes (Signed)
Follow-up Outpatient Visit Date: 11/18/2021  Primary Care Provider: Juluis Pitch, MD [address] [address]  Chief Complaint: Follow-up PSVT and hyperlipidemia  HPI:  Krystal Payne is a 77 y.o. female with history of PSVT, coronary artery calcification with mild perfusion defect near the apex (favor small area of ischemia versus artifact), hyperlipidemia, and left breast cancer status post lumpectomy and radiation, who presents for follow-up of hyperlipidemia and PSVT.  I last saw her in October, at which time she reported feeling better with discontinuation of rosuvastatin and metoprolol.  Previous hair loss resolved with these medication changes.  She also noticed improved energy.  As she had previously tolerated simvastatin up to 30 mg daily well, we agreed to restart this medication.  We also added bisoprolol 2.5 mg daily for management of her frequent PSVT noted on prior monitor.  Repeat lipid panel last week showed improvement in LDL from 178 -> 107 with addition of simvastatin.  Today, Krystal Payne reports that she has been feeling quite well.  She has not had any palpitations since our last visit.  She is tolerating low-dose bisoprolol well.  She is also doing well with current dose of simvastatin without further myalgias or hair loss.  She is trying to lose weight through exercise and the keto diet.  She denies chest pain, shortness of breath, palpitations, and lightheadedness.  She has not had much leg swelling, though she was referred to [facility] after home health nurse commented on some leg swelling.  --------------------------------------------------------------------------------------------------  Cardiovascular History & Procedures: Cardiovascular Problems: Chest pain SVT   Risk Factors: Hyperlipidemia, family history, and age > 85   Cath/PCI: None   CV Surgery: None   EP Procedures and Devices: 14-day event monitor (04/16/2021):  Predominantly sinus rhythm with occasional PACs and rare PVCs.  Numerous (2182) episodes of SVT lasting up to 4:21 hours occurred.  Some episodes were irregular and PAF could not be entirely excluded.   Non-Invasive Evaluation(s): TTE (06/09/2021): Normal LV size and wall thickness.  LVEF 55-60% with normal wall motion.  Grade 1 diastolic dysfunction.  Normal RV size and function.  Normal PA pressure.  Normal biatrial size.  No significant valvular abnormality.  Normal CVP. Pharmacologic MPI (05/06/2019): Abnormal, probably low risk study with small in size, mild in severity reversible apical inferior and apical lateral defect suggestive of mild ischemia but cannot rule out artifact.  LVEF >65%.  Mild coronary artery calcification present.  Recent CV Pertinent Labs: Lab Results  Component Value Date   CHOL 190 11/10/2021   CHOL 178 08/21/2019   HDL 69 11/10/2021   HDL 63 08/21/2019   LDLCALC 107 (H) 11/10/2021   LDLCALC 96 08/21/2019   LDLDIRECT 95 08/21/2019   TRIG 68 11/10/2021   CHOLHDL 2.8 11/10/2021   K 4.1 04/25/2019   K 4.6 04/24/2017   BUN 21 04/25/2019   BUN 15.0 04/24/2017   CREATININE 0.84 04/25/2019   CREATININE 0.8 04/24/2017    Past medical and surgical history were reviewed and updated in EPIC.  Current Meds  Medication Sig   aspirin EC 81 MG tablet Take 1 tablet (81 mg total) by mouth daily.   bisoprolol (ZEBETA) 5 MG tablet Take 0.5 tablets (2.5 mg total) by mouth daily.   cyclobenzaprine (FLEXERIL) 5 MG tablet Take 1 tablet by mouth daily as needed.   hydrocortisone 2.5 % cream Apply topically as directed. Qd up to 5 days a week to scaly areas around  nose and temples prn flares   metroNIDAZOLE (METROCREAM) 0.75 % cream Apply to affected areas face 1-2 times a day for rosacea.   oxybutynin (DITROPAN) 5 MG tablet Take 5 mg by mouth daily.   simvastatin (ZOCOR) 20 MG tablet Take 1.5 tablets (30 mg total) by mouth at bedtime.    Allergies: Patient has no known  allergies.  Social History   Tobacco Use   Smoking status: Former    Packs/day: 0.25    Years: 2.00    Pack years: 0.50    Types: Cigarettes    Quit date: 10/31/1968    Years since quitting: 53.0   Smokeless tobacco: Never  Vaping Use   Vaping Use: Never used  Substance Use Topics   Alcohol use: Not Currently    Alcohol/week: 1.0 standard drink    Types: 1 Glasses of wine per week    Comment: weekly   Drug use: No    Family History  Problem Relation Age of Onset   Heart disease Mother    Diabetes Mother    Cancer Brother        colon//under treatment   Bladder Cancer Brother    Cancer Maternal Aunt        breast/dx 1 years ago/currently 68   Alzheimer's disease Father    COPD Brother    Heart disease Brother    Heart disease Brother    Heart attack Brother    Lung disease Brother    Arrhythmia Sister    Breast cancer Other 25   Cancer Brother    Heart disease Sister    Heart attack Sister    Stroke Sister    Stroke Brother    Kidney cancer Neg Hx    Prostate cancer Neg Hx     Review of Systems: A 12-system review of systems was performed and was negative except as noted in the HPI.  --------------------------------------------------------------------------------------------------  Physical Exam: BP 110/80 (BP Location: Left Arm, Patient Position: Sitting, Cuff Size: Large)    Pulse 74    Ht 5\' 5"  (1.651 m)    Wt 170 lb (77.1 kg)    SpO2 98%    BMI 28.29 kg/m   General:  NAD. Neck: No JVD or HJR. Lungs: Clear to auscultation bilaterally without wheezes or crackles. Heart: Regular rate and rhythm without murmurs, rubs, or gallops. Abdomen: Soft, nontender, nondistended. Extremities: No lower extremity edema.  EKG: Ectopic atrial rhythm and left axis deviation.  Heart rate has decreased since 08/05/2021.  PACs are also no longer present.  Lab Results  Component Value Date   WBC 6.3 04/25/2019   HGB 13.9 04/25/2019   HCT 42.8 04/25/2019   MCV 94.1  04/25/2019   PLT 284 04/25/2019    Lab Results  Component Value Date   NA 141 04/25/2019   K 4.1 04/25/2019   CL 107 04/25/2019   CO2 26 04/25/2019   BUN 21 04/25/2019   CREATININE 0.84 04/25/2019   GLUCOSE 95 04/25/2019   ALT 13 11/10/2021    Lab Results  Component Value Date   CHOL 190 11/10/2021   HDL 69 11/10/2021   LDLCALC 107 (H) 11/10/2021   LDLDIRECT 95 08/21/2019   TRIG 68 11/10/2021   CHOLHDL 2.8 11/10/2021    --------------------------------------------------------------------------------------------------  ASSESSMENT AND PLAN: PSVT and ectopic atrial rhythm: Palpitations have ceased with addition of low-dose bisoprolol.  EKG today again shows unusual P wave axis suspicious for an ectopic atrial rhythm, though heart rate is lower  than in October.  Given absence of symptoms, we will defer further work-up/intervention.  Continue bisoprolol 2.5 mg daily.  Coronary artery calcification and hyperlipidemia: No angina reported.  LDL much improved and just above goal on recent lab check (LDL 107).  Given intolerance of higher doses of simvastatin as well as other statins, we will continue with simvastatin 30 mg daily.  Krystal Payne will continue to work on lifestyle modifications.  Follow-up: Return to clinic in 1 year.  Nelva Bush, MD 11/18/2021 11:23 AM

## 2021-11-18 ENCOUNTER — Ambulatory Visit: Payer: Medicare HMO | Admitting: Internal Medicine

## 2021-11-18 ENCOUNTER — Other Ambulatory Visit: Payer: Self-pay

## 2021-11-18 ENCOUNTER — Encounter: Payer: Self-pay | Admitting: Internal Medicine

## 2021-11-18 VITALS — BP 110/80 | HR 74 | Ht 65.0 in | Wt 170.0 lb

## 2021-11-18 DIAGNOSIS — I2510 Atherosclerotic heart disease of native coronary artery without angina pectoris: Secondary | ICD-10-CM

## 2021-11-18 DIAGNOSIS — I491 Atrial premature depolarization: Secondary | ICD-10-CM | POA: Diagnosis not present

## 2021-11-18 DIAGNOSIS — I471 Supraventricular tachycardia: Secondary | ICD-10-CM | POA: Diagnosis not present

## 2021-11-18 DIAGNOSIS — E7800 Pure hypercholesterolemia, unspecified: Secondary | ICD-10-CM

## 2021-11-18 MED ORDER — BISOPROLOL FUMARATE 5 MG PO TABS: 2.5000 mg | ORAL_TABLET | Freq: Every day | ORAL | 3 refills | Status: DC

## 2021-11-18 NOTE — Patient Instructions (Signed)
Medication Instructions:  - Your physician recommends that you continue on your current medications as directed. Please refer to the Current Medication list given to you today.  - Bisoprolol has been refilled for you today   *If you need a refill on your cardiac medications before your next appointment, please call your pharmacy*   Lab Work: - none ordered  If you have labs (blood work) drawn today and your tests are completely normal, you will receive your results only by: [place] (if you have MyChart) OR A paper copy in the mail If you have any lab test that is abnormal or we need to change your treatment, we will call you to review the results.   Testing/Procedures: - none ordered   Follow-Up: At [facility], you and your health needs are our priority.  As part of our continuing mission to provide you with exceptional heart care, we have created designated Provider Care Teams.  These Care Teams include your primary Cardiologist (physician) and Advanced Practice Providers (APPs -  Physician Assistants and Nurse Practitioners) who all work together to provide you with the care you need, when you need it.  We recommend signing up for the patient portal called "MyChart".  Sign up information is provided on this After Visit Summary.  MyChart is used to connect with patients for Virtual Visits (Telemedicine).  Patients are able to view lab/test results, encounter notes, upcoming appointments, etc.  Non-urgent messages can be sent to your provider as well.   To learn more about what you can do with MyChart, go to [email or web address].    Your next appointment:   1 year(s)  The format for your next appointment:   In Person  Provider:   You may see Nelva Bush, MD or one of the following Advanced Practice Providers on your designated Care Team:   Murray Hodgkins, NP Christell Faith, PA-C Cadence Kathlen Mody, [place]    Other Instructions N/a

## 2021-11-26 ENCOUNTER — Other Ambulatory Visit: Payer: Self-pay | Admitting: Internal Medicine

## 2021-12-08 ENCOUNTER — Ambulatory Visit: Payer: Medicare HMO | Admitting: Internal Medicine

## 2022-02-01 ENCOUNTER — Ambulatory Visit: Payer: Medicare HMO | Admitting: Dermatology

## 2022-02-01 DIAGNOSIS — L814 Other melanin hyperpigmentation: Secondary | ICD-10-CM

## 2022-02-01 DIAGNOSIS — L570 Actinic keratosis: Secondary | ICD-10-CM | POA: Diagnosis not present

## 2022-02-01 DIAGNOSIS — D225 Melanocytic nevi of trunk: Secondary | ICD-10-CM | POA: Diagnosis not present

## 2022-02-01 DIAGNOSIS — L719 Rosacea, unspecified: Secondary | ICD-10-CM

## 2022-02-01 DIAGNOSIS — L578 Other skin changes due to chronic exposure to nonionizing radiation: Secondary | ICD-10-CM | POA: Diagnosis not present

## 2022-02-01 DIAGNOSIS — Z85828 Personal history of other malignant neoplasm of skin: Secondary | ICD-10-CM

## 2022-02-01 DIAGNOSIS — L820 Inflamed seborrheic keratosis: Secondary | ICD-10-CM

## 2022-02-01 DIAGNOSIS — D692 Other nonthrombocytopenic purpura: Secondary | ICD-10-CM

## 2022-02-01 DIAGNOSIS — Z1283 Encounter for screening for malignant neoplasm of skin: Secondary | ICD-10-CM

## 2022-02-01 DIAGNOSIS — L738 Other specified follicular disorders: Secondary | ICD-10-CM

## 2022-02-01 DIAGNOSIS — D229 Melanocytic nevi, unspecified: Secondary | ICD-10-CM

## 2022-02-01 DIAGNOSIS — L821 Other seborrheic keratosis: Secondary | ICD-10-CM

## 2022-02-01 DIAGNOSIS — D1800 Hemangioma unspecified site: Secondary | ICD-10-CM

## 2022-02-01 NOTE — Patient Instructions (Addendum)
Cryotherapy Aftercare ? ?Wash gently with soap and water everyday.   ?Apply Vaseline and Band-Aid daily until healed.  ? ? ?Recommend starting moisturizer with exfoliant (Urea, Salicylic acid, or Lactic acid) one to two times daily to help smooth rough and bumpy skin.  OTC options include Cetaphil Rough and Bumpy lotion (Urea), Eucerin Roughness Relief lotion or spot treatment cream (Urea), CeraVe SA lotion/cream for Rough and Bumpy skin (Sal Acid), Gold Bond Rough and Bumpy cream (Sal Acid), and AmLactin 12% lotion/cream (Lactic Acid).  If applying in morning, also apply sunscreen to sun-exposed areas, since these exfoliating moisturizers can increase sensitivity to sun. ? ? ?Seborrheic Keratosis ? ?What causes seborrheic keratoses? ?Seborrheic keratoses are harmless, common skin growths that first appear during adult life.  As time goes by, more growths appear.  Some people may develop a large number of them.  Seborrheic keratoses appear on both covered and uncovered body parts.  They are not caused by sunlight.  The tendency to develop seborrheic keratoses can be inherited.  They vary in color from skin-colored to gray, brown, or even black.  They can be either smooth or have a rough, warty surface.   ?Seborrheic keratoses are superficial and look as if they were stuck on the skin.  Under the microscope this type of keratosis looks like layers upon layers of skin.  That is why at times the top layer may seem to fall off, but the rest of the growth remains and re-grows.   ? ?Treatment ?Seborrheic keratoses do not need to be treated, but can easily be removed in the office.  Seborrheic keratoses often cause symptoms when they rub on clothing or jewelry.  Lesions can be in the way of shaving.  If they become inflamed, they can cause itching, soreness, or burning.  Removal of a seborrheic keratosis can be accomplished by freezing, burning, or surgery. ?If any spot bleeds, scabs, or grows rapidly, please return to  have it checked, as these can be an indication of a skin cancer. ? ? ?If You Need Anything After Your Visit ? ?If you have any questions or concerns for your doctor, please call our main line at [phone] and press option 4 to reach your doctor's medical assistant. If no one answers, please leave a voicemail as directed and we will return your call as soon as possible. Messages left after 4 pm will be answered the following business day.  ? ?You may also send [place] a message via MyChart. We typically respond to MyChart messages within 1-2 business days. ? ?For prescription refills, please ask your pharmacy to contact our office. Our fax number is [phone]. ? ?If you have an urgent issue when the clinic is closed that cannot wait until the next business day, you can page your doctor at the number below.   ? ?Please note that while we do our best to be available for urgent issues outside of office hours, we are not available 24/7.  ? ?If you have an urgent issue and are unable to reach [place], you may choose to seek medical care at your doctor's office, retail clinic, urgent care center, or emergency room. ? ?If you have a medical emergency, please immediately call 911 or go to the emergency department. ? ?Pager Numbers ? ?- Dr. Nehemiah Massed: [phone] ? ?- Dr. Laurence Ferrari: [phone] ? ?- Dr. Nicole Kindred: [phone] ? ?In the event of inclement weather, please call our main line at [phone] for an update on the status of any  delays or closures. ? ?Dermatology Medication Tips: ?Please keep the boxes that topical medications come in in order to help keep track of the instructions about where and how to use these. Pharmacies typically print the medication instructions only on the boxes and not directly on the medication tubes.  ? ?If your medication is too expensive, please contact our office at [phone] option 4 or send [place] a message through [place].  ? ?We are unable to tell what your co-pay for medications will be in  advance as this is different depending on your insurance coverage. However, we may be able to find a substitute medication at lower cost or fill out paperwork to get insurance to cover a needed medication.  ? ?If a prior authorization is required to get your medication covered by your insurance company, please allow [place] 1-2 business days to complete this process. ? ?Drug prices often vary depending on where the prescription is filled and some pharmacies may offer cheaper prices. ? ?The website www.goodrx.com contains coupons for medications through different pharmacies. The prices here do not account for what the cost may be with help from insurance (it may be cheaper with your insurance), but the website can give you the price if you did not use any insurance.  ?- You can print the associated coupon and take it with your prescription to the pharmacy.  ?- You may also stop by our office during regular business hours and pick up a GoodRx coupon card.  ?- If you need your prescription sent electronically to a different pharmacy, notify our office through [facility] or by phone at [phone] option 4. ? ? ? ? ?Si Usted Necesita Algo Despu?s de Su Visita ? ?Tambi?n puede enviarnos un mensaje a trav?s de MyChart. Por lo general respondemos a los mensajes de MyChart en el transcurso de 1 a 2 d?as h?biles. ? ?Para renovar recetas, por favor pida a su farmacia que se ponga en contacto con nuestra oficina. Nuestro n?mero de fax es el [phone]. ? ?Si tiene un asunto urgente cuando la cl?nica est? cerrada y que no puede esperar hasta el siguiente d?a h?bil, puede llamar/localizar a su doctor(a) al n?mero que aparece a continuaci?n.  ? ?Por favor, tenga en cuenta que aunque hacemos todo lo posible para estar disponibles para asuntos urgentes fuera del horario de oficina, no estamos disponibles las 24 horas del d?a, los 7 d?as de la semana.  ? ?Si tiene un problema urgente y no puede comunicarse con nosotros, puede  optar por buscar atenci?n m?dica  en el consultorio de su doctor(a), en una cl?nica privada, en un centro de atenci?n urgente o en una sala de emergencias. ? ?Si tiene Engineer, maintenance (IT) m?dica, por favor llame inmediatamente al 911 o vaya a la sala de emergencias. ? ?N?meros de b?per ? ?- Dr. Nehemiah Massed: [phone] ? ?- Dra. Moye: [phone] ? ?- Dra. Nicole Kindred: [phone] ? ?En caso de inclemencias del tiempo, por favor llame a nuestra l?nea principal al [phone] para una actualizaci?n sobre el estado de cualquier retraso o cierre. ? ?Consejos para la medicaci?n en dermatolog?a: ?Por favor, guarde las cajas en las que vienen los medicamentos de uso t?pico para ayudarle a seguir las instrucciones sobre d?nde y c?mo usarlos. Las farmacias generalmente imprimen las instrucciones del medicamento s?lo en las cajas y no directamente en los tubos del [place].  ? ?Si su medicamento es muy caro, por favor, p?ngase en contacto con Zigmund Daniel llamando al [phone] y presione la opci?n  4 o env?enos un mensaje a trav?s de MyChart.  ? ?No podemos decirle cu?l ser? su copago por los medicamentos por adelantado ya que esto es diferente dependiendo de la cobertura de su seguro. Sin embargo, es posible que podamos encontrar un medicamento sustituto a Electrical engineer un formulario para que el seguro cubra el medicamento que se considera necesario.  ? ?Si se requiere Ardelia Mems autorizaci?n previa para que su compa??a de seguros [place] su medicamento, por favor perm?tanos de 1 a 2 d?as h?biles para completar este proceso. ? ?Los precios de los medicamentos var?an con frecuencia dependiendo del Environmental consultant de d?nde se surte la receta y alguna farmacias pueden ofrecer precios m?s baratos. ? ?El sitio web www.goodrx.com tiene cupones para medicamentos de Airline pilot. Los precios aqu? no tienen en cuenta lo que podr?a costar con la ayuda del seguro (puede ser m?s barato con su seguro), pero el sitio web puede darle el  precio si no utiliz? ning?n seguro.  ?- Puede imprimir el cup?n correspondiente y llevarlo con su receta a la farmacia.  ?- Tambi?n puede pasar por nuestra oficina durante el horario de atenci?n regular y re

## 2022-02-01 NOTE — Progress Notes (Signed)
? ?Follow-Up Visit ?  ?Subjective  ?Krystal Payne is a 76 y.o. female who presents for the following: Follow-up. ? ?The patient presents for Total-Body Skin Exam (TBSE) for skin cancer screening and mole check.  The patient has spots, moles and lesions to be evaluated, some may be new or changing.  She has a history of BCCs and AKs. She has Rosacea of the cheeks and chin, no improvement with metronidazole cream. It got better on its own.  She has a couple itchy spots she picks at on upper arms. ? ?The following portions of the chart were reviewed this encounter and updated as appropriate:  ?  ?  ? ?Review of Systems:  No other skin or systemic complaints except as noted in HPI or Assessment and Plan. ? ?Objective  ?Well appearing patient in no apparent distress; mood and affect are within normal limits. ? ?A full examination was performed including scalp, head, eyes, ears, nose, lips, neck, chest, axillae, abdomen, back, buttocks, bilateral upper extremities, bilateral lower extremities, hands, feet, fingers, toes, fingernails, and toenails. All findings within normal limits unless otherwise noted below. ? ?R upper buttock ?2.0 x 3.0 mm brown macule ? ?cheeks, chin ?Clear today. Mild pinkness of chin ? ?L cheek x 1, sternal notch x 3 (4) ?Pink scaly macules. ? ?L upper arm x 1, R upper arm x 1 (2) ?Erythematous stuck-on, waxy papule ? ? ? ?Assessment & Plan . ?Skin cancer screening performed today. ? ?Actinic Damage ?- chronic, secondary to cumulative UV radiation exposure/sun exposure over time ?- diffuse scaly erythematous macules with underlying dyspigmentation ?- Recommend daily broad spectrum sunscreen SPF 30+ to sun-exposed areas, reapply every 2 hours as needed.  ?- Recommend staying in the shade or wearing long sleeves, sun glasses (UVA+UVB protection) and wide brim hats (4-inch brim around the entire circumference of the hat). ?- Call for new or changing lesions. ? ?History of Basal Cell Carcinoma of  the Skin ?- No evidence of recurrence today of the right upper nasolabial, mid upper sternum, L upper back, L mid upper back ?- Recommend regular full body skin exams ?- Recommend daily broad spectrum sunscreen SPF 30+ to sun-exposed areas, reapply every 2 hours as needed.  ?- Call if any new or changing lesions are noted between office visits ? ?Lentigines ?- Scattered tan macules ?- Due to sun exposure ?- Benign-appering, observe ?- Recommend daily broad spectrum sunscreen SPF 30+ to sun-exposed areas, reapply every 2 hours as needed. ?- Call for any changes ? ?Seborrheic Keratoses ?- Stuck-on, waxy, tan-brown papules and/or plaques  ?- Benign-appearing ?- Discussed benign etiology and prognosis. ?- Observe ?- Call for any changes ? ?Sebaceous Hyperplasia ?- Small yellow papules with a central dell ?- Benign ?- Observe ? ?Purpura - Chronic; persistent and recurrent.  Treatable, but not curable. ?- Violaceous macules and patches ?- Benign ?- Related to trauma, age, sun damage and/or use of blood thinners, chronic use of topical and/or oral steroids ?- Observe ?- Can use OTC arnica containing moisturizer such as Dermend Bruise Formula if desired ?- Call for worsening or other concerns ? ?Hemangiomas ?- Red papules ?- Discussed benign nature ?- Observe ?- Call for any changes ? ?Nevus ?R upper buttock ? ?Benign-appearing.  Stable. Observation.  Call clinic for new or changing moles.  Recommend daily use of broad spectrum spf 30+ sunscreen to sun-exposed areas.  ? ?Rosacea ?cheeks, chin ? ?Mild, no treatment needed at this time ? ?Rosacea is a chronic progressive  skin condition usually affecting the face of adults, causing redness and/or acne bumps. It is treatable but not curable. It sometimes affects the eyes (ocular rosacea) as well. It may respond to topical and/or systemic medication and can flare with stress, sun exposure, alcohol, exercise and some foods.  Daily application of broad spectrum spf 30+ sunscreen  to face is recommended to reduce flares. ? ?  ? ?AK (actinic keratosis) (4) ?L cheek x 1, sternal notch x 3 ? ?Actinic keratoses are precancerous spots that appear secondary to cumulative UV radiation exposure/sun exposure over time. They are chronic with expected duration over 1 year. A portion of actinic keratoses will progress to squamous cell carcinoma of the skin. It is not possible to reliably predict which spots will progress to skin cancer and so treatment is recommended to prevent development of skin cancer. ? ?Recommend daily broad spectrum sunscreen SPF 30+ to sun-exposed areas, reapply every 2 hours as needed.  ?Recommend staying in the shade or wearing long sleeves, sun glasses (UVA+UVB protection) and wide brim hats (4-inch brim around the entire circumference of the hat). ?Call for new or changing lesions. ? ?Destruction of lesion - L cheek x 1, sternal notch x 3 ? ?Destruction method: cryotherapy   ?Informed consent: discussed and consent obtained   ?Lesion destroyed using liquid nitrogen: Yes   ?Region frozen until ice ball extended beyond lesion: Yes   ?Outcome: patient tolerated procedure well with no complications   ?Post-procedure details: wound care instructions given   ?Additional details:  Prior to procedure, discussed risks of blister formation, small wound, skin dyspigmentation, or rare scar following cryotherapy. Recommend Vaseline ointment to treated areas while healing. ? ? ?Inflamed seborrheic keratosis (2) ?L upper arm x 1, R upper arm x 1 ? ?Destruction of lesion - L upper arm x 1, R upper arm x 1 ? ?Destruction method: cryotherapy   ?Informed consent: discussed and consent obtained   ?Lesion destroyed using liquid nitrogen: Yes   ?Region frozen until ice ball extended beyond lesion: Yes   ?Outcome: patient tolerated procedure well with no complications   ?Post-procedure details: wound care instructions given   ?Additional details:  Prior to procedure, discussed risks of blister  formation, small wound, skin dyspigmentation, or rare scar following cryotherapy. Recommend Vaseline ointment to treated areas while healing. ? ? ? ?Return in about 1 year (around 02/02/2023) for TBSE, Hx BCC. ? ?I, Jamesetta Orleans, CMA, am acting as scribe for Brendolyn Patty, MD .. ?Documentation: I have reviewed the above documentation for accuracy and completeness, and I agree with the above. ? ?Brendolyn Patty MD  ? ? ?

## 2022-02-12 ENCOUNTER — Other Ambulatory Visit: Payer: Self-pay | Admitting: Internal Medicine

## 2022-04-21 ENCOUNTER — Other Ambulatory Visit: Payer: Self-pay | Admitting: Family Medicine

## 2022-04-21 DIAGNOSIS — Z1231 Encounter for screening mammogram for malignant neoplasm of breast: Secondary | ICD-10-CM

## 2022-05-26 ENCOUNTER — Ambulatory Visit
Admission: RE | Admit: 2022-05-26 | Discharge: 2022-05-26 | Disposition: A | Discharge status: Home / Self Care | Payer: Medicare HMO | Source: Ambulatory Visit | Attending: Family Medicine | Admitting: Family Medicine

## 2022-05-26 DIAGNOSIS — Z1231 Encounter for screening mammogram for malignant neoplasm of breast: Secondary | ICD-10-CM

## 2022-05-27 ENCOUNTER — Other Ambulatory Visit: Payer: Self-pay | Admitting: Family Medicine

## 2022-05-27 DIAGNOSIS — R928 Other abnormal and inconclusive findings on diagnostic imaging of breast: Secondary | ICD-10-CM

## 2022-06-01 ENCOUNTER — Other Ambulatory Visit: Payer: Self-pay | Admitting: Family Medicine

## 2022-06-01 ENCOUNTER — Ambulatory Visit
Admission: RE | Admit: 2022-06-01 | Discharge: 2022-06-01 | Disposition: A | Discharge status: Home / Self Care | Payer: Medicare HMO | Source: Ambulatory Visit | Attending: Family Medicine | Admitting: Family Medicine

## 2022-06-01 DIAGNOSIS — R921 Mammographic calcification found on diagnostic imaging of breast: Secondary | ICD-10-CM

## 2022-06-01 DIAGNOSIS — R928 Other abnormal and inconclusive findings on diagnostic imaging of breast: Secondary | ICD-10-CM

## 2022-06-09 ENCOUNTER — Ambulatory Visit
Admission: RE | Admit: 2022-06-09 | Discharge: 2022-06-09 | Disposition: A | Discharge status: Home / Self Care | Payer: Medicare HMO | Source: Ambulatory Visit | Attending: Family Medicine | Admitting: Family Medicine

## 2022-06-09 DIAGNOSIS — R921 Mammographic calcification found on diagnostic imaging of breast: Secondary | ICD-10-CM

## 2022-07-29 ENCOUNTER — Other Ambulatory Visit: Payer: Self-pay | Admitting: Internal Medicine

## 2022-08-29 ENCOUNTER — Encounter (INDEPENDENT_AMBULATORY_CARE_PROVIDER_SITE_OTHER): Payer: Self-pay

## 2022-10-26 ENCOUNTER — Other Ambulatory Visit: Payer: Self-pay | Admitting: Internal Medicine

## 2022-10-27 NOTE — Telephone Encounter (Signed)
Please schedule F/U appointment for further refills. Thank you! 

## 2022-11-20 NOTE — Progress Notes (Signed)
Cardiology Clinic Note   Patient Name: Krystal Payne Date of Encounter: 11/21/2022  Primary Care Provider:  Juluis Pitch, MD Primary Cardiologist:  Krystal Bush, MD  Patient Profile    77 year old female with a past medical history of PSVT, coronary artery calcification, hyperlipidemia, left breast cancer, who presents for follow-up of her hyperlipidemia and PSVT.  Past Medical History    Past Medical History:  Diagnosis Date   Actinic keratosis    Arm fracture, left 1969   Basal cell carcinoma 05/11/2009   mid upper sternum   Basal cell carcinoma 12/10/2018   right upper nasolabial, excised 01/14/2019   Basal cell carcinoma 07/07/2020   Left mid upper back, EDC   Basal cell carcinoma (BCC) of back 01/21/2020   L upper back, EDC   Breast cancer ([place])    left breast   Coronary artery disease involving native coronary artery of native heart without angina pectoris 08/05/2021   History of radiation therapy 01/25/12 - 03/09/12   left breast   Hyperlipidemia    Personal history of radiation therapy 2013   Urgency of urination    Past Surgical History:  Procedure Laterality Date   BREAST BIOPSY Left 12/15/2011   BREAST LUMPECTOMY Left 01/02/2012   COLONOSCOPY WITH PROPOFOL N/A 11/01/2017   Procedure: COLONOSCOPY WITH PROPOFOL;  Surgeon: Toledo, Benay Pike, MD;  Location: [facility] ENDOSCOPY;  Service: Gastroenterology;  Laterality: N/A;   VAGINAL HYSTERECTOMY  1985    Allergies  No Known Allergies  History of Present Illness    Krystal Payne is a 77 year old female with the previously mentioned past medical history of PSVT, coronary artery calcifications with mild perfusion defect near the apex, hyperlipidemia, and left breast cancer status post lumpectomy and radiation.  Lexiscan MPI 05/2019 with abnormal, probably low risk with small in size and mild in severity, reversible defect involving the apical inferior and lateral segments that may represent ischemia  but cannot rule out artifact, LVEF by visual estimation is greater than 65%, Long-term heart monitor  04/2019 with increased amount of episodes of narrow complex tachycardia, most of which likely represent SVT, some episodes were irregular raised possibility of paroxysmal atrial fibrillation, longest episode lasted for hours with a maximum heart rate of 231 bpm there were no prolonged pauses that were identified or only occasional PACs and PVCs.  Echocardiogram completed 05/2021 revealed LVEF 55-60%, no regional wall motion abnormalities, G1 DD, and no valvular abnormalities.  Last seen in clinic 11/18/2021 by Dr. She is doing very well.  And she has been started on low-dose of bisoprolol due to her palpitations and has not had any breakthrough events since that time.  She has continued to follow with [facility] after home health nurse will commented on some leg swelling.  She returns to clinic today stating that overall she has been feeling well. She endorses weight gain over the holidays but continues to be active and goes to [employer] three times per week. Denies any break through palpitations, chest pain, or shortness of breath. Denies any hospitalizations or emergency department visits.   Home Medications    Current Outpatient Medications  Medication Sig Dispense Refill   aspirin EC 81 MG tablet Take 1 tablet (81 mg total) by mouth daily. 90 tablet 3   cyclobenzaprine (FLEXERIL) 5 MG tablet Take 1 tablet by mouth daily as needed.     hydrocortisone 2.5 % cream Apply topically as directed. Qd up to 5 days a  week to scaly areas around nose and temples prn flares 30 g 2   oxybutynin (DITROPAN) 5 MG tablet Take 5 mg by mouth daily.     simvastatin (ZOCOR) 20 MG tablet TAKE 1 AND 1/2 TABLETS AT  BEDTIME (Patient taking differently: Take 20 mg by mouth at bedtime.) 135 tablet 0   bisoprolol (ZEBETA) 5 MG tablet Take 0.5 tablets (2.5 mg total) by mouth daily. 45 tablet 3   No  current facility-administered medications for this visit.     Family History    Family History  Problem Relation Age of Onset   Heart disease Mother    Diabetes Mother    Cancer Brother        colon//under treatment   Bladder Cancer Brother    Cancer Maternal Aunt        breast/dx 49 years ago/currently 48   Alzheimer's disease Father    COPD Brother    Heart disease Brother    Heart disease Brother    Heart attack Brother    Lung disease Brother    Arrhythmia Sister    Breast cancer Other 24   Cancer Brother    Heart disease Sister    Heart attack Sister    Stroke Sister    Stroke Brother    Kidney cancer Neg Hx    Prostate cancer Neg Hx    She indicated that her mother is deceased. She indicated that her father is deceased. She indicated that all of her four sisters are alive. She indicated that three of her seven brothers are alive. She indicated that her maternal aunt is deceased. She indicated that the status of her neg hx is unknown. She indicated that her other is deceased.  Social History    Social History   Socioeconomic History   Marital status: Married    Spouse name: Not on file   Number of children: Not on file   Years of education: Not on file   Highest education level: Not on file  Occupational History   Not on file  Tobacco Use   Smoking status: Former    Packs/day: 0.25    Years: 2.00    Total pack years: 0.50    Types: Cigarettes    Quit date: 10/31/1968    Years since quitting: 54.0   Smokeless tobacco: Never  Vaping Use   Vaping Use: Never used  Substance and Sexual Activity   Alcohol use: Not Currently    Alcohol/week: 1.0 standard drink of alcohol    Types: 1 Glasses of wine per week    Comment: weekly   Drug use: No   Sexual activity: Yes    Birth control/protection: Surgical, Post-menopausal  Other Topics Concern   Not on file  Social History Narrative   Not on file   Social Determinants of Health   Financial Resource Strain:  Not on file  Food Insecurity: Not on file  Transportation Needs: Not on file  Physical Activity: Not on file  Stress: Not on file  Social Connections: Not on file  Intimate Partner Violence: Not on file     Review of Systems    General:  No chills, fever, night sweats or weight changes.  Cardiovascular:  No chest pain, dyspnea on exertion, edema, orthopnea, palpitations, paroxysmal nocturnal dyspnea. Dermatological: No rash, lesions/masses Respiratory: No cough, dyspnea Urologic: No hematuria, dysuria Abdominal:   No nausea, vomiting, diarrhea, bright red blood per rectum, melena, or hematemesis Neurologic:  No visual changes,  wkns, changes in mental status. All other systems reviewed and are otherwise negative except as noted above.   Physical Exam    VS:  BP 122/70 (BP Location: Right Arm, Patient Position: Sitting, Cuff Size: Normal)   Pulse 67   Ht '5\' 5"'$  (1.651 m)   Wt 170 lb (77.1 kg)   SpO2 98%   BMI 28.29 kg/m  , BMI Body mass index is 28.29 kg/m.     GEN: Well nourished, well developed, in no acute distress. HEENT: normal. Neck: Supple, no JVD, carotid bruits, or masses. Cardiac: RRR, no murmurs, rubs, or gallops. No clubbing, cyanosis, edema.  Radials 2+/PT 2+ and equal bilaterally.  Respiratory:  Respirations regular and unlabored, clear to auscultation bilaterally. GI: Soft, nontender, nondistended, BS + x 4. MS: no deformity or atrophy. Skin: warm and dry, no rash. Neuro:  Strength and sensation are intact. Psych: Normal affect.  Accessory Clinical Findings    ECG personally reviewed by me today- sinus rhythm rate of 67, left axis deviation - No acute changes  Lab Results  Component Value Date   WBC 6.3 04/25/2019   HGB 13.9 04/25/2019   HCT 42.8 04/25/2019   MCV 94.1 04/25/2019   PLT 284 04/25/2019   Lab Results  Component Value Date   CREATININE 0.84 04/25/2019   BUN 21 04/25/2019   NA 141 04/25/2019   K 4.1 04/25/2019   CL 107 04/25/2019    CO2 26 04/25/2019   Lab Results  Component Value Date   ALT 13 11/10/2021   AST 13 (L) 04/25/2019   ALKPHOS 75 04/25/2019   BILITOT 0.4 04/25/2019   Lab Results  Component Value Date   CHOL 190 11/10/2021   HDL 69 11/10/2021   LDLCALC 107 (H) 11/10/2021   LDLDIRECT 95 08/21/2019   TRIG 68 11/10/2021   CHOLHDL 2.8 11/10/2021    No results found for: "HGBA1C"  Assessment & Plan   1.  History of PSVT with palpitations that have ceased since starting on low dose bisoprolol 2.5 mg daily. She had previously been tried on 5 mg daily but the dose was too large. She does request a refill today sent to her mail-in pharmacy.   2. Coronary artery calcification noted on CT. she continues to deny anginal or anginal equivalents.  She is currently continued on aspirin 81 mg daily.  No further workup/intervention is needed at this time.  3.  Hyperlipidemia with a last LDL of 107 on 11/10/2021.  She will be continued on simvastatin 20 mg daily at bedtime.  She has been sent for a lipid panel today.  She has been encouraged to continue with the lifestyle modifications that she had had some weight gain over the holidays due to snacking which would likely impact her cholesterol as well.  4.  Position patient return to clinic to see MD/APP in 1 year or sooner if needed with EKG on return.  Darek Eifler, NP 11/21/2022, 8:35 AM

## 2022-11-21 ENCOUNTER — Other Ambulatory Visit
Admission: RE | Admit: 2022-11-21 | Discharge: 2022-11-21 | Disposition: A | Discharge status: Home / Self Care | Payer: Medicare HMO | Attending: Cardiology | Admitting: Cardiology

## 2022-11-21 ENCOUNTER — Encounter: Payer: Self-pay | Admitting: Cardiology

## 2022-11-21 ENCOUNTER — Ambulatory Visit: Payer: Medicare HMO | Attending: Cardiology | Admitting: Cardiology

## 2022-11-21 VITALS — BP 122/70 | HR 67 | Ht 65.0 in | Wt 170.0 lb

## 2022-11-21 DIAGNOSIS — E785 Hyperlipidemia, unspecified: Secondary | ICD-10-CM | POA: Diagnosis not present

## 2022-11-21 DIAGNOSIS — I2510 Atherosclerotic heart disease of native coronary artery without angina pectoris: Secondary | ICD-10-CM | POA: Diagnosis not present

## 2022-11-21 DIAGNOSIS — I471 Supraventricular tachycardia, unspecified: Secondary | ICD-10-CM

## 2022-11-21 DIAGNOSIS — Z79899 Other long term (current) drug therapy: Secondary | ICD-10-CM

## 2022-11-21 LAB — LIPID PANEL
Cholesterol: 204 mg/dL — ABNORMAL HIGH (ref 0–200)
HDL: 74 mg/dL (ref >40)
LDL Cholesterol: 113 mg/dL — ABNORMAL HIGH (ref 0–99)
Total CHOL/HDL Ratio: 2.8 RATIO
Triglycerides: 84 mg/dL (ref <150)
VLDL: 17 mg/dL (ref 0–40)

## 2022-11-21 MED ORDER — BISOPROLOL FUMARATE 5 MG PO TABS: 2.5000 mg | ORAL_TABLET | Freq: Every day | ORAL | 3 refills | Status: DC

## 2022-11-21 NOTE — Patient Instructions (Signed)
Medication Instructions:  Your Physician recommend you continue on your current medication as directed.    *If you need a refill on your cardiac medications before your next appointment, please call your pharmacy*   Lab Work: Your provider would like for you to have following labs drawn: (Lipid).   Please go to the [facility] entrance and check in at the front desk.  You do not need an appointment.  They are open from 7am-6 pm.   If you have labs (blood work) drawn today and your tests are completely normal, you will receive your results only by: [place] (if you have MyChart) OR A paper copy in the mail If you have any lab test that is abnormal or we need to change your treatment, we will call you to review the results.   Testing/Procedures: None ordered today   Follow-Up: At [facility], you and your health needs are our priority.  As part of our continuing mission to provide you with exceptional heart care, we have created designated Provider Care Teams.  These Care Teams include your primary Cardiologist (physician) and Advanced Practice Providers (APPs -  Physician Assistants and Nurse Practitioners) who all work together to provide you with the care you need, when you need it.  We recommend signing up for the patient portal called "MyChart".  Sign up information is provided on this After Visit Summary.  MyChart is used to connect with patients for Virtual Visits (Telemedicine).  Patients are able to view lab/test results, encounter notes, upcoming appointments, etc.  Non-urgent messages can be sent to your provider as well.   To learn more about what you can do with MyChart, go to [email or web address].    Your next appointment:   1 year(s)  Provider:   You may see Nelva Bush, MD or one of the following Advanced Practice Providers on your designated Care Team:   Murray Hodgkins, NP Christell Faith, PA-C Cadence Kathlen Mody, PA-C Gerrie Nordmann, NP

## 2022-11-21 NOTE — Progress Notes (Signed)
LDL increased from last year. Previously patient had tolerated 30 mg of simvastatin nightly but is currently taking 20 mg according to her record. Recommend she increase back to 30 mg nightly with repeat lipid and hepatic panel in 3 months.

## 2022-11-22 ENCOUNTER — Telehealth: Payer: Self-pay | Admitting: *Deleted

## 2022-11-22 DIAGNOSIS — I2510 Atherosclerotic heart disease of native coronary artery without angina pectoris: Secondary | ICD-10-CM

## 2022-11-22 DIAGNOSIS — E785 Hyperlipidemia, unspecified: Secondary | ICD-10-CM

## 2022-11-22 DIAGNOSIS — E7800 Pure hypercholesterolemia, unspecified: Secondary | ICD-10-CM

## 2022-11-22 MED ORDER — SIMVASTATIN 20 MG PO TABS: 30.0000 mg | ORAL_TABLET | Freq: Every day | ORAL | 3 refills | Status: DC

## 2022-11-22 NOTE — Telephone Encounter (Signed)
Reviewed results and recommendations with patient. She verbalized understanding with no further questions at this time. Medication increased and reviewed repeat labs needed. Will send this information to her through My Chart per her request. She verbalized understanding of our conversation, agreement with plan, and had no further questions at this time.

## 2022-11-22 NOTE — Telephone Encounter (Signed)
-----  Message from Gerrie Nordmann, NP sent at 11/21/2022  2:07 PM EST ----- LDL increased from last year. Previously patient had tolerated 30 mg of simvastatin nightly but is currently taking 20 mg according to her record. Recommend she increase back to 30 mg nightly with repeat lipid and hepatic panel in 3 months.

## 2023-01-11 ENCOUNTER — Encounter: Payer: Self-pay | Admitting: Licensed Clinical Social Worker

## 2023-01-11 ENCOUNTER — Inpatient Hospital Stay: Payer: Medicare HMO

## 2023-01-11 ENCOUNTER — Inpatient Hospital Stay: Payer: Medicare HMO | Attending: Oncology | Admitting: Licensed Clinical Social Worker

## 2023-01-11 DIAGNOSIS — Z8042 Family history of malignant neoplasm of prostate: Secondary | ICD-10-CM

## 2023-01-11 DIAGNOSIS — C50412 Malignant neoplasm of upper-outer quadrant of left female breast: Secondary | ICD-10-CM | POA: Diagnosis not present

## 2023-01-11 DIAGNOSIS — Z803 Family history of malignant neoplasm of breast: Secondary | ICD-10-CM | POA: Diagnosis not present

## 2023-01-11 DIAGNOSIS — Z170 Estrogen receptor positive status [ER+]: Secondary | ICD-10-CM

## 2023-01-11 DIAGNOSIS — Z800 Family history of malignant neoplasm of digestive organs: Secondary | ICD-10-CM

## 2023-01-11 NOTE — Progress Notes (Signed)
REFERRING PROVIDER: Ok Edwards, NP [place],  [place] [id number]  PRIMARY PROVIDER:  Juluis Pitch, MD  PRIMARY REASON FOR VISIT:  1. Malignant neoplasm of upper-outer quadrant of left breast in female, estrogen receptor positive (Krystal Payne)   2. Family history of breast cancer   3. Family history of colon cancer      HISTORY OF PRESENT ILLNESS:   Krystal Payne, a 77 y.o. female, was seen for a [place] cancer genetics consultation at the request of Stephens November, NP due to a family history of cancer.  Krystal Payne presents to clinic today to discuss the possibility of a hereditary predisposition to cancer, genetic testing, and to further clarify her future cancer risks, as well as potential cancer risks for family members.    CANCER HISTORY:  At the age of 77, Krystal Payne was diagnosed with invasive ductal carcinoma of the left breast. The treatment plan included lumpectomy, adjuvant radiation and adjuvant anastrozole. She also has history of basal cell carcinomas.  Oncology History Overview Note  ASSESSMENT: 77 y.o. [place], [place] woman:  1.  Status post left lumpectomy and left axillary sentinel node biopsy on 01/02/2012 for a pT1c pN0,stage IA invasive ductal carcinoma, grade 2,  ER 100%, PR 100%, Ki-67 6%, HER-2/neu negative for amplification  2.  Status post radiation therapy from 01/25/2012 through 03/09/2012.  3.  The patient started anastrozole in 03/2012, the plan being to continue for a total of 5 years (until June 2018).  4.   Osteopenia: the patient's bone density scan on 03/14/2012 showed a T score of -1.8.             (a) on Fosamax.             (b) repeat bone density July 2015 showed a T score of -1.1 (improved)  PLAN: Krystal Payne is tolerating the anastrozole well. She gets it at no cost. We discussed the new data that suggests that 10 years of aromatase inhibitors is superior to 5 years. The differences  small but measurable. It bounced about 3% if her and send recurrence. Since she is also tolerating the Fosamax well, this may be something she will want to consider.  I suggested she go back to Silver sneakers. I also offered her referral to a urologist to discuss the stress urinary incontinence further. She wants to wait on that.  We discussed our new survivorship program. She will see our survivorship nurse practitioner a year from now and she will see me again in 2 years. At that time we will discuss what she wants to do regarding treatment and further follow-up   Malignant neoplasm of upper-outer quadrant of left breast in female, estrogen receptor positive (Krystal Payne)  01/02/2012 Definitive Surgery   Left lumpectomy/SLNB: invasive ductal carcinoma, grade 2, ER+ (100%), PR+ (100%), HER2/neu negative, Ki67 6%   01/02/2012 Pathologic Stage   Stage IA: T1c N0   01/25/2012 - 03/09/2012 Radiation Therapy   Adjuvant RT left breast   03/2012 -  Anti-estrogen oral therapy   Anastrozole 1 mg daily.  Planned duration of therapy 5-10 years     RISK FACTORS:  Menarche was at age 66.  First live birth at age 55.  Ovaries intact: yes.  Hysterectomy: yes.  Menopausal status: postmenopausal.  HRT use: 0 years. Colonoscopy: yes;  reports >10 cumulative polyps .Has been having cscopes since age 35. Mammogram within the last year: yes.  Past Medical History:  Diagnosis  Date   Actinic keratosis    Arm fracture, left 1969   Basal cell carcinoma 05/11/2009   mid upper sternum   Basal cell carcinoma 12/10/2018   right upper nasolabial, excised 01/14/2019   Basal cell carcinoma 07/07/2020   Left mid upper back, EDC   Basal cell carcinoma (BCC) of back 01/21/2020   L upper back, EDC   Breast cancer ([place])    left breast   Coronary artery disease involving native coronary artery of native heart without angina pectoris 08/05/2021   History of radiation therapy 01/25/12 - 03/09/12   left breast    Hyperlipidemia    Personal history of radiation therapy 2013   Urgency of urination     Past Surgical History:  Procedure Laterality Date   BREAST BIOPSY Left 12/15/2011   BREAST LUMPECTOMY Left 01/02/2012   COLONOSCOPY WITH PROPOFOL N/A 11/01/2017   Procedure: COLONOSCOPY WITH PROPOFOL;  Surgeon: Toledo, Benay Pike, MD;  Location: [facility] ENDOSCOPY;  Service: Gastroenterology;  Laterality: N/A;   VAGINAL HYSTERECTOMY  1985    FAMILY HISTORY:  We obtained a detailed, 4-generation family history.  Significant diagnoses are listed below: Family History  Problem Relation Age of Onset   Heart disease Mother    Diabetes Mother    Alzheimer's disease Father    Arrhythmia Sister    Heart disease Sister    Heart attack Sister    Stroke Sister    Colon cancer Brother    Bladder Cancer Brother    COPD Brother    Heart disease Brother    Heart disease Brother    Heart attack Brother    Lung disease Brother    Colon cancer Brother    Stroke Brother    Breast cancer Maternal Aunt        70s   Breast cancer Other 42   Prostate cancer Nephew        metastatic   Prostate cancer Nephew        dx 47s   Kidney cancer Neg Hx    Krystal Payne has 1 son, 33 and 1 daughter, 63. She has 11 siblings. One brother had colon and bladder cancer and passed in his 29s. Another brother had colon cancer and passed in his 66s. A niece died of breast cancer at 62. A nephew currently has prostate cancer that is metastatic. Another nephew had prostate cancer in his 52s.  Krystal Payne mother died at 71. She did have 6 inches of her colon removed, unsure if for cancer but may have been for diverticulitis. A maternal aunt had breast cancer and died in her 75s. A maternal cousin had lung cancer. No other known cancers on this side of the family.  Krystal Payne father died at 36. No known cancers on his side of the family.  Krystal Payne is unaware of previous family history of genetic testing for hereditary cancer risks.  There is no reported Ashkenazi Jewish ancestry. There is no known consanguinity.    GENETIC COUNSELING ASSESSMENT: Ms. Winford is a 77 y.o. female with a personal and family history of cancer/colon polyps which is somewhat suggestive of a hereditary cancer syndrome and predisposition to cancer. We, therefore, discussed and recommended the following at today's visit.   DISCUSSION: We discussed that approximately 10% of  cancer is hereditary. Most cases of hereditary breast cancer are associated with BRCA1/BRCA2 genes and most cases of hereditary colon cancer are associated with Lynch syndrome genes,  although there are other genes  associated with hereditary cancer/ colon polyposis as well. Cancers and risks are gene specific. We discussed that testing is beneficial for several reasons including knowing about cancer risks, identifying potential screening and risk-reduction options that may be appropriate, and to understand if other family members could be at risk for cancer and allow them to undergo genetic testing.   We reviewed the characteristics, features and inheritance patterns of hereditary cancer syndromes. We also discussed genetic testing, including the appropriate family members to test, the process of testing, insurance coverage and turn-around-time for results. We discussed the implications of a negative, positive and/or variant of uncertain significant result. We recommended Ms. Zachar pursue genetic testing for the [facility] Multi-Cancer+RNA gene panel.   Based on Ms. Dipinto's personal and family history of cancer, she meets medical criteria for genetic testing. Despite that she meets criteria, she may still have an out of pocket cost. We discussed that if her out of pocket cost for testing is over $100, the laboratory will call and confirm whether she wants to proceed with testing.  If the out of pocket cost of testing is less than $100 she will be billed by the genetic testing laboratory.    PLAN: After considering the risks, benefits, and limitations, Ms. Conkle provided informed consent to pursue genetic testing and the blood sample was sent to [facility] for analysis of the Multi-Cancer+RNA panel. Results should be available within approximately 2-3 weeks' time, at which point they will be disclosed by telephone to Ms. Poullard, as will any additional recommendations warranted by these results. Ms. Warne will receive a summary of her genetic counseling visit and a copy of her results once available. This information will also be available in Epic.   Ms. Flanagin questions were answered to her satisfaction today. Our contact information was provided should additional questions or concerns arise. Thank you for the referral and allowing [place] to share in the care of your patient.   Faith Rogue, MS, [facility] Genetic Counselor [email or web address] Phone: [phone]  The patient was seen for a total of 30 minutes in face-to-face genetic counseling.  Dr. Grayland Ormond was available for discussion regarding this case.   _______________________________________________________________________ For Office Staff:  Number of people involved in session: 1 Was an Intern/ student involved with case: no

## 2023-01-27 ENCOUNTER — Telehealth: Payer: Self-pay | Admitting: Licensed Clinical Social Worker

## 2023-01-27 NOTE — Telephone Encounter (Signed)
I contacted [name] to discuss her genetic testing results. No pathogenic variants were identified in the 70 genes analyzed. Detailed clinic note to follow.   The test report has been scanned into EPIC and is located under the Molecular Pathology section of the Results Review tab.  A portion of the result report is included below for reference.      Faith Rogue, MS, [facility] Genetic Counselor [email or web address] Phone: [phone]

## 2023-01-31 ENCOUNTER — Ambulatory Visit: Payer: Self-pay | Admitting: Licensed Clinical Social Worker

## 2023-01-31 ENCOUNTER — Encounter: Payer: Self-pay | Admitting: Licensed Clinical Social Worker

## 2023-01-31 DIAGNOSIS — Z1379 Encounter for other screening for genetic and chromosomal anomalies: Secondary | ICD-10-CM | POA: Insufficient documentation

## 2023-01-31 NOTE — Progress Notes (Signed)
HPI:   Ms. Wainio was previously seen in the [place] clinic due to a personal and family history of cancer and concerns regarding a hereditary predisposition to cancer. Please refer to our prior cancer genetics clinic note for more information regarding our discussion, assessment and recommendations, at the time. Ms. Mcnamar recent genetic test results were disclosed to her, as were recommendations warranted by these results. These results and recommendations are discussed in more detail below.  CANCER HISTORY:  Oncology History Overview Note  ASSESSMENT: 77 y.o. [place], [place] woman:  1.  Status post left lumpectomy and left axillary sentinel node biopsy on 01/02/2012 for a pT1c pN0,stage IA invasive ductal carcinoma, grade 2,  ER 100%, PR 100%, Ki-67 6%, HER-2/neu negative for amplification  2.  Status post radiation therapy from 01/25/2012 through 03/09/2012.  3.  The patient started anastrozole in 03/2012, the plan being to continue for a total of 5 years (until June 2018).  4.   Osteopenia: the patient's bone density scan on 03/14/2012 showed a T score of -1.8.             (a) on Fosamax.             (b) repeat bone density July 2015 showed a T score of -1.1 (improved)  PLAN: Abreana is tolerating the anastrozole well. She gets it at no cost. We discussed the new data that suggests that 10 years of aromatase inhibitors is superior to 5 years. The differences small but measurable. It bounced about 3% if her and send recurrence. Since she is also tolerating the Fosamax well, this may be something she will want to consider.  I suggested she go back to Silver sneakers. I also offered her referral to a urologist to discuss the stress urinary incontinence further. She wants to wait on that.  We discussed our new survivorship program. She will see our survivorship nurse practitioner a year from now and she will see me again in 2 years. At that time we will discuss  what she wants to do regarding treatment and further follow-up   Malignant neoplasm of upper-outer quadrant of left breast in female, estrogen receptor positive  01/02/2012 Definitive Surgery   Left lumpectomy/SLNB: invasive ductal carcinoma, grade 2, ER+ (100%), PR+ (100%), HER2/neu negative, Ki67 6%   01/02/2012 Pathologic Stage   Stage IA: T1c N0   01/25/2012 - 03/09/2012 Radiation Therapy   Adjuvant RT left breast   03/2012 -  Anti-estrogen oral therapy   Anastrozole 1 mg daily.  Planned duration of therapy 5-10 years     FAMILY HISTORY:  We obtained a detailed, 4-generation family history.  Significant diagnoses are listed below: Family History  Problem Relation Age of Onset   Heart disease Mother    Diabetes Mother    Alzheimer's disease Father    Arrhythmia Sister    Heart disease Sister    Heart attack Sister    Stroke Sister    Colon cancer Brother    Bladder Cancer Brother    COPD Brother    Heart disease Brother    Heart disease Brother    Heart attack Brother    Lung disease Brother    Colon cancer Brother    Stroke Brother    Breast cancer Maternal Aunt        70s   Breast cancer Other 42   Prostate cancer Nephew        metastatic   Prostate cancer Nephew  dx 34s   Kidney cancer Neg Hx    Ms. Isidoro has 1 son, 53 and 1 daughter, 40. She has 11 siblings. One brother had colon and bladder cancer and passed in his 65s. Another brother had colon cancer and passed in his 85s. A niece died of breast cancer at 88. A nephew currently has prostate cancer that is metastatic. Another nephew had prostate cancer in his 40s.   Ms. Suing mother died at 47. She did have 6 inches of her colon removed, unsure if for cancer but may have been for diverticulitis. A maternal aunt had breast cancer and died in her 48s. A maternal cousin had lung cancer. No other known cancers on this side of the family.   Ms. Vivenzio father died at 17. No known cancers on his side of the  family.   Ms. Brundige is unaware of previous family history of genetic testing for hereditary cancer risks. There is no reported Ashkenazi Jewish ancestry. There is no known consanguinity.      GENETIC TEST RESULTS:  The [facility] Multi-Cancer+RNA Panel found no pathogenic mutations.   The Multi-Cancer + RNA Panel offered by [facility] includes sequencing and/or deletion/duplication analysis of the following 70 genes:  AIP*, ALK, APC*, ATM*, AXIN2*, BAP1*, BARD1*, BLM*, BMPR1A*, BRCA1*, BRCA2*, BRIP1*, CDC73*, CDH1*, CDK4, CDKN1B*, CDKN2A, CHEK2*, CTNNA1*, DICER1*, EPCAM, EGFR, FH*, FLCN*, GREM1, HOXB13, KIT, LZTR1, MAX*, MBD4, MEN1*, MET, MITF, MLH1*, MSH2*, MSH3*, MSH6*, MUTYH*, NF1*, NF2*, NTHL1*, PALB2*, PDGFRA, PMS2*, POLD1*, POLE*, POT1*, PRKAR1A*, PTCH1*, PTEN*, RAD51C*, RAD51D*, RB1*, RET, SDHA*, SDHAF2*, SDHB*, SDHC*, SDHD*, SMAD4*, SMARCA4*, SMARCB1*, SMARCE1*, STK11*, SUFU*, TMEM127*, TP53*, TSC1*, TSC2*, VHL*. RNA analysis is performed for * genes.   The test report has been scanned into EPIC and is located under the Molecular Pathology section of the Results Review tab.  A portion of the result report is included below for reference. Genetic testing reported out on 01/26/2023.    Even though a pathogenic variant was not identified, possible explanations for the cancer in the family may include: There may be no hereditary risk for cancer in the family. The cancers in Ms. Nuttall and/or her family may be sporadic/familial or due to other genetic and environmental factors. There may be a gene mutation in one of these genes that current testing methods cannot detect but that chance is small. There could be another gene that has not yet been discovered, or that we have not yet tested, that is responsible for the cancer diagnoses in the family.  It is also possible there is a hereditary cause for the cancer in the family that Ms. Baney did not inherit.  Therefore, it is important to remain in touch  with cancer genetics in the future so that we can continue to offer Ms. Clapsaddle the most up to date genetic testing.   ADDITIONAL GENETIC TESTING:  We discussed with Ms. Mills that her genetic testing was fairly extensive.  If there are additional relevant genes identified to increase cancer risk that can be analyzed in the future, we would be happy to discuss and coordinate this testing at that time.    CANCER SCREENING RECOMMENDATIONS:  Ms. Blauer test result is considered negative (normal).  This means that we have not identified a hereditary cause for her personal and family history of cancer at this time.   An individual's cancer risk and medical management are not determined by genetic test results alone. Overall cancer risk assessment incorporates additional factors, including personal medical history, family  history, and any available genetic information that may result in a personalized plan for cancer prevention and surveillance. Therefore, it is recommended she continue to follow the cancer management and screening guidelines provided by her  primary healthcare provider.  Based on the reported personal and family history, specific cancer screenings for Ms. Payton Doughty and her family include:  Colon Cancer Screening: Due to Ms. Branson's family's history of colon cancer, she is recommended to repeat colonoscopies at least every 5 years. More frequent colonoscopies may be recommended if polyps are identified.  RECOMMENDATIONS FOR FAMILY MEMBERS:   Since she did not inherit a identifiable mutation in a cancer predisposition gene included on this panel, her children could not have inherited a known mutation from her in one of these genes. Individuals in this family might be at some increased risk of developing cancer, over the general population risk, due to the family history of cancer.  Individuals in the family should notify their providers of the family history of cancer. We recommend  women in this family have a yearly mammogram beginning at age 81, or 73 years younger than the earliest onset of cancer, an annual clinical breast exam, and perform monthly breast self-exams.  Family members should have colonoscopies by at age 36, or earlier, as recommended by their providers. Other members of the family may still carry a pathogenic variant in one of these genes that Ms. Hulet did not inherit. Based on the family history, we recommend her relatives that have had cancer, or their close relatives, have genetic counseling and testing. Ms. Krummel will let us know if we can be of any assistance in coordinating genetic counseling and/or testing for this family member.     FOLLOW-UP:  Lastly, we discussed with Ms. Sebastiano that cancer genetics is a rapidly advancing field and it is possible that new genetic tests will be appropriate for her and/or her family members in the future. We encouraged her to remain in contact with cancer genetics on an annual basis so we can update her personal and family histories and let her know of advances in cancer genetics that may benefit this family.   Our contact number was provided. Ms. Lippe questions were answered to her satisfaction, and she knows she is welcome to call us at anytime with additional questions or concerns.    Faith Rogue, MS, [facility] Genetic Counselor [email or web address] Phone: [phone]

## 2023-02-07 ENCOUNTER — Ambulatory Visit: Payer: Medicare HMO | Admitting: Dermatology

## 2023-02-14 ENCOUNTER — Ambulatory Visit: Payer: Medicare HMO | Admitting: Dermatology

## 2023-02-14 VITALS — BP 102/60 | HR 69

## 2023-02-14 DIAGNOSIS — L738 Other specified follicular disorders: Secondary | ICD-10-CM

## 2023-02-14 DIAGNOSIS — Z1283 Encounter for screening for malignant neoplasm of skin: Secondary | ICD-10-CM

## 2023-02-14 DIAGNOSIS — L988 Other specified disorders of the skin and subcutaneous tissue: Secondary | ICD-10-CM

## 2023-02-14 DIAGNOSIS — D229 Melanocytic nevi, unspecified: Secondary | ICD-10-CM

## 2023-02-14 DIAGNOSIS — L578 Other skin changes due to chronic exposure to nonionizing radiation: Secondary | ICD-10-CM

## 2023-02-14 DIAGNOSIS — D692 Other nonthrombocytopenic purpura: Secondary | ICD-10-CM | POA: Diagnosis not present

## 2023-02-14 DIAGNOSIS — L814 Other melanin hyperpigmentation: Secondary | ICD-10-CM

## 2023-02-14 DIAGNOSIS — Z85828 Personal history of other malignant neoplasm of skin: Secondary | ICD-10-CM

## 2023-02-14 DIAGNOSIS — D1801 Hemangioma of skin and subcutaneous tissue: Secondary | ICD-10-CM

## 2023-02-14 DIAGNOSIS — D225 Melanocytic nevi of trunk: Secondary | ICD-10-CM

## 2023-02-14 DIAGNOSIS — L821 Other seborrheic keratosis: Secondary | ICD-10-CM

## 2023-02-14 NOTE — Progress Notes (Signed)
Follow-Up Visit   Subjective  Krystal Payne is a 77 y.o. female who presents for the following: Skin Cancer Screening and Full Body Skin Exam  The patient presents for Total-Body Skin Exam (TBSE) for skin cancer screening and mole check. The patient has spots, moles and lesions to be evaluated, some may be new or changing and the patient has concerns that these could be cancer.    The following portions of the chart were reviewed this encounter and updated as appropriate: medications, allergies, medical history  Review of Systems:  No other skin or systemic complaints except as noted in HPI or Assessment and Plan.  Objective  Well appearing patient in no apparent distress; mood and affect are within normal limits.  A full examination was performed including scalp, head, eyes, ears, nose, lips, neck, chest, axillae, abdomen, back, buttocks, bilateral upper extremities, bilateral lower extremities, hands, feet, fingers, toes, fingernails, and toenails. All findings within normal limits unless otherwise noted below.   Relevant physical exam findings are noted in the Assessment and Plan.    Assessment & Plan   LENTIGINES, SEBORRHEIC KERATOSES, HEMANGIOMAS - Benign normal skin lesions - Benign-appearing - Call for any changes  MELANOCYTIC NEVI - Tan-brown and/or pink-flesh-colored symmetric macules and papules - R upper buttock   2.0 x 3.0 mm brown macule, stable - Benign appearing on exam today - Observation - Call clinic for new or changing moles - Recommend daily use of broad spectrum spf 30+ sunscreen to sun-exposed areas.   ACTINIC DAMAGE - Chronic condition, secondary to cumulative UV/sun exposure - diffuse scaly erythematous macules with underlying dyspigmentation - Recommend daily broad spectrum sunscreen SPF 30+ to sun-exposed areas, reapply every 2 hours as needed.  - Staying in the shade or wearing long sleeves, sun glasses (UVA+UVB protection) and wide brim  hats (4-inch brim around the entire circumference of the hat) are also recommended for sun protection.  - Call for new or changing lesions.  SKIN CANCER SCREENING PERFORMED TODAY.  HISTORY OF BASAL CELL CARCINOMA OF THE SKIN - No evidence of recurrence today - Recommend regular full body skin exams - Recommend daily broad spectrum sunscreen SPF 30+ to sun-exposed areas, reapply every 2 hours as needed.  - Call if any new or changing lesions are noted between office visits  Purpura - Chronic; persistent and recurrent.  Treatable, but not curable. - Violaceous macules and patches - Benign - Related to trauma, age, sun damage and/or use of blood thinners, chronic use of topical and/or oral steroids - Observe - Can use OTC arnica containing moisturizer such as Dermend Bruise Formula if desired - Call for worsening or other concerns  Sebaceous Hyperplasia - Small yellow papules with a central dell - Benign-appearing - Observe. Call for changes.  FACIAL ELASTOSIS Exam: Rhytides and volume loss of the glabella. Deep midline line.  Treatment Plan: Discussed Botox injections to the frown complex. Plan 25 units.   Recommend daily broad spectrum sunscreen SPF 30+ to sun-exposed areas, reapply every 2 hours as needed. Call for new or changing lesions.  Staying in the shade or wearing long sleeves, sun glasses (UVA+UVB protection) and wide brim hats (4-inch brim around the entire circumference of the hat) are also recommended for sun protection.    Return in about 1 year (around 02/14/2024) for TBSE, Hx BCC, Hx AKs. As scheduled 02/27/23 for Botox. Wendee Beavers, CMA, am acting as scribe for Willeen Niece, MD .   Documentation: I have  reviewed the above documentation for accuracy and completeness, and I agree with the above.  Willeen Niece, MD

## 2023-02-14 NOTE — Patient Instructions (Signed)
Due to recent changes in healthcare laws, you may see results of your pathology and/or laboratory studies on MyChart before the doctors have had a chance to review them. We understand that in some cases there may be results that are confusing or concerning to you. Please understand that not all results are received at the same time and often the doctors may need to interpret multiple results in order to provide you with the best plan of care or course of treatment. Therefore, we ask that you please give us 2 business days to thoroughly review all your results before contacting the office for clarification. Should we see a critical lab result, you will be contacted sooner.   If You Need Anything After Your Visit  If you have any questions or concerns for your doctor, please call our main line at [phone] and press option 4 to reach your doctor's medical assistant. If no one answers, please leave a voicemail as directed and we will return your call as soon as possible. Messages left after 4 pm will be answered the following business day.   You may also send us a message via MyChart. We typically respond to MyChart messages within 1-2 business days.  For prescription refills, please ask your pharmacy to contact our office. Our fax number is [phone].  If you have an urgent issue when the clinic is closed that cannot wait until the next business day, you can page your doctor at the number below.    Please note that while we do our best to be available for urgent issues outside of office hours, we are not available 24/7.   If you have an urgent issue and are unable to reach us, you may choose to seek medical care at your doctor's office, retail clinic, urgent care center, or emergency room.  If you have a medical emergency, please immediately call 911 or go to the emergency department.  Pager Numbers  - Dr. Kowalski: [phone]  - Dr. Moye: [phone]  - Dr. Stewart:  [phone]  In the event of inclement weather, please call our main line at [phone] for an update on the status of any delays or closures.  Dermatology Medication Tips: Please keep the boxes that topical medications come in in order to help keep track of the instructions about where and how to use these. Pharmacies typically print the medication instructions only on the boxes and not directly on the medication tubes.   If your medication is too expensive, please contact our office at [phone] option 4 or send us a message through MyChart.   We are unable to tell what your co-pay for medications will be in advance as this is different depending on your insurance coverage. However, we may be able to find a substitute medication at lower cost or fill out paperwork to get insurance to cover a needed medication.   If a prior authorization is required to get your medication covered by your insurance company, please allow us 1-2 business days to complete this process.  Drug prices often vary depending on where the prescription is filled and some pharmacies may offer cheaper prices.  The website www.goodrx.com contains coupons for medications through different pharmacies. The prices here do not account for what the cost may be with help from insurance (it may be cheaper with your insurance), but the website can give you the price if you did not use any insurance.  - You can print the associated coupon and take it with   your prescription to the pharmacy.  - You may also stop by our office during regular business hours and pick up a GoodRx coupon card.  - If you need your prescription sent electronically to a different pharmacy, notify our office through [facility] MyChart or by phone at [phone] option 4.     Si Usted Necesita Algo Despus de Su Visita  Tambin puede enviarnos un mensaje a travs de MyChart. Por lo general respondemos a los mensajes de MyChart en el transcurso de 1 a 2  das hbiles.  Para renovar recetas, por favor pida a su farmacia que se ponga en contacto con nuestra oficina. Nuestro nmero de fax es el [phone].  Si tiene un asunto urgente cuando la clnica est cerrada y que no puede esperar hasta el siguiente da hbil, puede llamar/localizar a su doctor(a) al nmero que aparece a continuacin.   Por favor, tenga en cuenta que aunque hacemos todo lo posible para estar disponibles para asuntos urgentes fuera del horario de oficina, no estamos disponibles las 24 horas del da, los 7 das de la semana.   Si tiene un problema urgente y no puede comunicarse con nosotros, puede optar por buscar atencin mdica  en el consultorio de su doctor(a), en una clnica privada, en un centro de atencin urgente o en una sala de emergencias.  Si tiene una emergencia mdica, por favor llame inmediatamente al 911 o vaya a la sala de emergencias.  Nmeros de bper  - Dr. Kowalski: [phone]  - Dra. Moye: [phone]  - Dra. Stewart: [phone]  En caso de inclemencias del tiempo, por favor llame a nuestra lnea principal al [phone] para una actualizacin sobre el estado de cualquier retraso o cierre.  Consejos para la medicacin en dermatologa: Por favor, guarde las cajas en las que vienen los medicamentos de uso tpico para ayudarle a seguir las instrucciones sobre dnde y cmo usarlos. Las farmacias generalmente imprimen las instrucciones del medicamento slo en las cajas y no directamente en los tubos del medicamento.   Si su medicamento es muy caro, por favor, pngase en contacto con nuestra oficina llamando al [phone] y presione la opcin 4 o envenos un mensaje a travs de MyChart.   No podemos decirle cul ser su copago por los medicamentos por adelantado ya que esto es diferente dependiendo de la cobertura de su seguro. Sin embargo, es posible que podamos encontrar un medicamento sustituto a menor costo o llenar un formulario para que el  seguro cubra el medicamento que se considera necesario.   Si se requiere una autorizacin previa para que su compaa de seguros cubra su medicamento, por favor permtanos de 1 a 2 das hbiles para completar este proceso.  Los precios de los medicamentos varan con frecuencia dependiendo del lugar de dnde se surte la receta y alguna farmacias pueden ofrecer precios ms baratos.  El sitio web www.goodrx.com tiene cupones para medicamentos de diferentes farmacias. Los precios aqu no tienen en cuenta lo que podra costar con la ayuda del seguro (puede ser ms barato con su seguro), pero el sitio web puede darle el precio si no utiliz ningn seguro.  - Puede imprimir el cupn correspondiente y llevarlo con su receta a la farmacia.  - Tambin puede pasar por nuestra oficina durante el horario de atencin regular y recoger una tarjeta de cupones de GoodRx.  - Si necesita que su receta se enve electrnicamente a una farmacia diferente, informe a nuestra oficina a travs de MyChart de [place]   o por telfono llamando al [phone] y presione la opcin 4.  

## 2023-02-27 ENCOUNTER — Ambulatory Visit (INDEPENDENT_AMBULATORY_CARE_PROVIDER_SITE_OTHER): Payer: Self-pay | Admitting: Dermatology

## 2023-02-27 VITALS — BP 100/64

## 2023-02-27 DIAGNOSIS — L988 Other specified disorders of the skin and subcutaneous tissue: Secondary | ICD-10-CM

## 2023-02-27 NOTE — Progress Notes (Signed)
   Follow-Up Visit   Subjective  Krystal Payne is a 77 y.o. female who presents for the following: Botox for facial elastosis  The following portions of the chart were reviewed this encounter and updated as appropriate: medications, allergies, medical history  Review of Systems:  No other skin or systemic complaints except as noted in HPI or Assessment and Plan.  Objective  Well appearing patient in no apparent distress; mood and affect are within normal limits.  A focused examination was performed of the face.  Relevant physical exam findings are noted in the Assessment and Plan.  Before botox photos if first time,         Injection map photo    Assessment & Plan   Elastosis of skin   Facial Elastosis  Location: See attached image  Informed consent: Discussed risks (infection, pain, bleeding, bruising, swelling, allergic reaction, paralysis of nearby muscles, eyelid droop, double vision, neck weakness, difficulty breathing, headache, undesirable cosmetic result, and need for additional treatment) and benefits of the procedure, as well as the alternatives.  Informed consent was obtained.  Discussed that deep line at rest will not clear, but may flatten with regular scheduled Botox treatments.  Preparation: The area was cleansed with alcohol.  Procedure Details:  Botox was injected into the dermis with a 30-gauge needle. Pressure applied to any bleeding. Ice packs offered for swelling.  Lot Number:  [id number] Expiration:  04/26  Total Units Injected:  25  Plan: Tylenol may be used for headache.  Allow 2 weeks before returning to clinic for additional dosing as needed. Patient will call for any problems.  Return in about 3 weeks (around 03/20/2023) for Botox f/u, post op.  I, Ardis Rowan, RMA, am acting as scribe for Willeen Niece, MD .   Documentation: I have reviewed the above documentation for accuracy and completeness, and I agree with the above.  Willeen Niece, MD

## 2023-02-27 NOTE — Patient Instructions (Signed)
Due to recent changes in healthcare laws, you may see results of your pathology and/or laboratory studies on MyChart before the doctors have had a chance to review them. We understand that in some cases there may be results that are confusing or concerning to you. Please understand that not all results are received at the same time and often the doctors may need to interpret multiple results in order to provide you with the best plan of care or course of treatment. Therefore, we ask that you please give us 2 business days to thoroughly review all your results before contacting the office for clarification. Should we see a critical lab result, you will be contacted sooner.   If You Need Anything After Your Visit  If you have any questions or concerns for your doctor, please call our main line at [phone] and press option 4 to reach your doctor's medical assistant. If no one answers, please leave a voicemail as directed and we will return your call as soon as possible. Messages left after 4 pm will be answered the following business day.   You may also send us a message via MyChart. We typically respond to MyChart messages within 1-2 business days.  For prescription refills, please ask your pharmacy to contact our office. Our fax number is [phone].  If you have an urgent issue when the clinic is closed that cannot wait until the next business day, you can page your doctor at the number below.    Please note that while we do our best to be available for urgent issues outside of office hours, we are not available 24/7.   If you have an urgent issue and are unable to reach us, you may choose to seek medical care at your doctor's office, retail clinic, urgent care center, or emergency room.  If you have a medical emergency, please immediately call 911 or go to the emergency department.  Pager Numbers  - Dr. Kowalski: [phone]  - Dr. Moye: [phone]  - Dr. Stewart:  [phone]  In the event of inclement weather, please call our main line at [phone] for an update on the status of any delays or closures.  Dermatology Medication Tips: Please keep the boxes that topical medications come in in order to help keep track of the instructions about where and how to use these. Pharmacies typically print the medication instructions only on the boxes and not directly on the medication tubes.   If your medication is too expensive, please contact our office at [phone] option 4 or send us a message through MyChart.   We are unable to tell what your co-pay for medications will be in advance as this is different depending on your insurance coverage. However, we may be able to find a substitute medication at lower cost or fill out paperwork to get insurance to cover a needed medication.   If a prior authorization is required to get your medication covered by your insurance company, please allow us 1-2 business days to complete this process.  Drug prices often vary depending on where the prescription is filled and some pharmacies may offer cheaper prices.  The website www.goodrx.com contains coupons for medications through different pharmacies. The prices here do not account for what the cost may be with help from insurance (it may be cheaper with your insurance), but the website can give you the price if you did not use any insurance.  - You can print the associated coupon and take it with   your prescription to the pharmacy.  - You may also stop by our office during regular business hours and pick up a GoodRx coupon card.  - If you need your prescription sent electronically to a different pharmacy, notify our office through [facility] MyChart or by phone at [phone] option 4.     Si Usted Necesita Algo Despus de Su Visita  Tambin puede enviarnos un mensaje a travs de MyChart. Por lo general respondemos a los mensajes de MyChart en el transcurso de 1 a 2  das hbiles.  Para renovar recetas, por favor pida a su farmacia que se ponga en contacto con nuestra oficina. Nuestro nmero de fax es el [phone].  Si tiene un asunto urgente cuando la clnica est cerrada y que no puede esperar hasta el siguiente da hbil, puede llamar/localizar a su doctor(a) al nmero que aparece a continuacin.   Por favor, tenga en cuenta que aunque hacemos todo lo posible para estar disponibles para asuntos urgentes fuera del horario de oficina, no estamos disponibles las 24 horas del da, los 7 das de la semana.   Si tiene un problema urgente y no puede comunicarse con nosotros, puede optar por buscar atencin mdica  en el consultorio de su doctor(a), en una clnica privada, en un centro de atencin urgente o en una sala de emergencias.  Si tiene una emergencia mdica, por favor llame inmediatamente al 911 o vaya a la sala de emergencias.  Nmeros de bper  - Dr. Kowalski: [phone]  - Dra. Moye: [phone]  - Dra. Stewart: [phone]  En caso de inclemencias del tiempo, por favor llame a nuestra lnea principal al [phone] para una actualizacin sobre el estado de cualquier retraso o cierre.  Consejos para la medicacin en dermatologa: Por favor, guarde las cajas en las que vienen los medicamentos de uso tpico para ayudarle a seguir las instrucciones sobre dnde y cmo usarlos. Las farmacias generalmente imprimen las instrucciones del medicamento slo en las cajas y no directamente en los tubos del medicamento.   Si su medicamento es muy caro, por favor, pngase en contacto con nuestra oficina llamando al [phone] y presione la opcin 4 o envenos un mensaje a travs de MyChart.   No podemos decirle cul ser su copago por los medicamentos por adelantado ya que esto es diferente dependiendo de la cobertura de su seguro. Sin embargo, es posible que podamos encontrar un medicamento sustituto a menor costo o llenar un formulario para que el  seguro cubra el medicamento que se considera necesario.   Si se requiere una autorizacin previa para que su compaa de seguros cubra su medicamento, por favor permtanos de 1 a 2 das hbiles para completar este proceso.  Los precios de los medicamentos varan con frecuencia dependiendo del lugar de dnde se surte la receta y alguna farmacias pueden ofrecer precios ms baratos.  El sitio web www.goodrx.com tiene cupones para medicamentos de diferentes farmacias. Los precios aqu no tienen en cuenta lo que podra costar con la ayuda del seguro (puede ser ms barato con su seguro), pero el sitio web puede darle el precio si no utiliz ningn seguro.  - Puede imprimir el cupn correspondiente y llevarlo con su receta a la farmacia.  - Tambin puede pasar por nuestra oficina durante el horario de atencin regular y recoger una tarjeta de cupones de GoodRx.  - Si necesita que su receta se enve electrnicamente a una farmacia diferente, informe a nuestra oficina a travs de MyChart de [facility]   o por telfono llamando al [phone] y presione la opcin 4.  

## 2023-03-10 ENCOUNTER — Encounter: Payer: Self-pay | Admitting: Emergency Medicine

## 2023-03-13 ENCOUNTER — Ambulatory Visit: Payer: Medicare HMO | Admitting: Anesthesiology

## 2023-03-13 ENCOUNTER — Encounter
Admission: RE | Disposition: A | Discharge status: Home / Self Care | Payer: Self-pay | Source: Home / Self Care | Attending: Gastroenterology

## 2023-03-13 ENCOUNTER — Ambulatory Visit
Admission: RE | Admit: 2023-03-13 | Discharge: 2023-03-13 | Disposition: A | Discharge status: Home / Self Care | Payer: Medicare HMO | Attending: Gastroenterology | Admitting: Gastroenterology

## 2023-03-13 ENCOUNTER — Encounter: Payer: Self-pay | Admitting: *Deleted

## 2023-03-13 DIAGNOSIS — Z87891 Personal history of nicotine dependence: Secondary | ICD-10-CM | POA: Diagnosis not present

## 2023-03-13 DIAGNOSIS — Z90710 Acquired absence of both cervix and uterus: Secondary | ICD-10-CM | POA: Insufficient documentation

## 2023-03-13 DIAGNOSIS — E785 Hyperlipidemia, unspecified: Secondary | ICD-10-CM | POA: Diagnosis not present

## 2023-03-13 DIAGNOSIS — Z1211 Encounter for screening for malignant neoplasm of colon: Secondary | ICD-10-CM | POA: Diagnosis present

## 2023-03-13 DIAGNOSIS — Z08 Encounter for follow-up examination after completed treatment for malignant neoplasm: Secondary | ICD-10-CM | POA: Insufficient documentation

## 2023-03-13 DIAGNOSIS — K640 First degree hemorrhoids: Secondary | ICD-10-CM | POA: Diagnosis not present

## 2023-03-13 DIAGNOSIS — Z800 Family history of malignant neoplasm of digestive organs: Secondary | ICD-10-CM | POA: Insufficient documentation

## 2023-03-13 DIAGNOSIS — K5730 Diverticulosis of large intestine without perforation or abscess without bleeding: Secondary | ICD-10-CM | POA: Diagnosis not present

## 2023-03-13 DIAGNOSIS — Z853 Personal history of malignant neoplasm of breast: Secondary | ICD-10-CM | POA: Insufficient documentation

## 2023-03-13 DIAGNOSIS — Z09 Encounter for follow-up examination after completed treatment for conditions other than malignant neoplasm: Secondary | ICD-10-CM | POA: Diagnosis not present

## 2023-03-13 DIAGNOSIS — Z86010 Personal history of colonic polyps: Secondary | ICD-10-CM | POA: Insufficient documentation

## 2023-03-13 DIAGNOSIS — K648 Other hemorrhoids: Secondary | ICD-10-CM | POA: Diagnosis not present

## 2023-03-13 DIAGNOSIS — Z79899 Other long term (current) drug therapy: Secondary | ICD-10-CM | POA: Diagnosis not present

## 2023-03-13 HISTORY — PX: COLONOSCOPY WITH PROPOFOL: SHX5780

## 2023-03-13 SURGERY — COLONOSCOPY WITH PROPOFOL
Anesthesia: General

## 2023-03-13 MED ORDER — LIDOCAINE HCL (CARDIAC) PF 100 MG/5ML IV SOSY
PREFILLED_SYRINGE | INTRAVENOUS | Status: DC | PRN
  Administered 2023-03-13: 20 mg via INTRAVENOUS

## 2023-03-13 MED ORDER — PROPOFOL 10 MG/ML IV BOLUS
INTRAVENOUS | Status: DC | PRN
  Administered 2023-03-13: 50 mg via INTRAVENOUS
  Administered 2023-03-13: 30 mg via INTRAVENOUS
  Administered 2023-03-13: 70 mg via INTRAVENOUS
  Administered 2023-03-13: 40 mg via INTRAVENOUS

## 2023-03-13 MED ORDER — SODIUM CHLORIDE 0.9 % IV SOLN: INTRAVENOUS | Status: DC

## 2023-03-13 MED ORDER — PROPOFOL 1000 MG/100ML IV EMUL
INTRAVENOUS | Status: AC
  Filled 2023-03-13: qty 100

## 2023-03-13 MED ORDER — PROPOFOL 500 MG/50ML IV EMUL
INTRAVENOUS | Status: DC | PRN
  Administered 2023-03-13: 125 ug/kg/min via INTRAVENOUS

## 2023-03-13 NOTE — Anesthesia Preprocedure Evaluation (Addendum)
Anesthesia Evaluation  Patient identified by MRN, date of birth, ID band Patient awake    Reviewed: Allergy & Precautions, NPO status , Patient's Chart, lab work & pertinent test results  History of Anesthesia Complications Negative for: history of anesthetic complications  Airway Mallampati: I   Neck ROM: Full    Dental  (+) Missing   Pulmonary former smoker (quit 1970)   Pulmonary exam normal breath sounds clear to auscultation       Cardiovascular Normal cardiovascular exam+ dysrhythmias Supra Ventricular Tachycardia  Rhythm:Regular Rate:Normal  ECG 11/21/22: sinus rhythm rate of 67, left axis deviation - No acute changes   Neuro/Psych negative neurological ROS     GI/Hepatic negative GI ROS,,,  Endo/Other  negative endocrine ROS    Renal/GU negative Renal ROS     Musculoskeletal   Abdominal   Peds  Hematology Breast CA   Anesthesia Other Findings Cardiology note 11/21/22:  1.  History of PSVT with palpitations that have ceased since starting on low dose bisoprolol 2.5 mg daily. She had previously been tried on 5 mg daily but the dose was too large. She does request a refill today sent to her mail-in pharmacy.    2. Coronary artery calcification noted on CT. she continues to deny anginal or anginal equivalents.  She is currently continued on aspirin 81 mg daily.  No further workup/intervention is needed at this time.   3.  Hyperlipidemia with a last LDL of 107 on 11/10/2021.  She will be continued on simvastatin 20 mg daily at bedtime.  She has been sent for a lipid panel today.  She has been encouraged to continue with the lifestyle modifications that she had had some weight gain over the holidays due to snacking which would likely impact her cholesterol as well.   4.  Position patient return to clinic to see MD/APP in 1 year or sooner if needed with EKG on return.   Reproductive/Obstetrics                              Anesthesia Physical Anesthesia Plan  ASA: 2  Anesthesia Plan: General   Post-op Pain Management:    Induction: Intravenous  PONV Risk Score and Plan: 3 and Propofol infusion, TIVA and Treatment may vary due to age or medical condition  Airway Management Planned: Natural Airway  Additional Equipment:   Intra-op Plan:   Post-operative Plan:   Informed Consent: I have reviewed the patients History and Physical, chart, labs and discussed the procedure including the risks, benefits and alternatives for the proposed anesthesia with the patient or authorized representative who has indicated his/her understanding and acceptance.       Plan Discussed with: CRNA  Anesthesia Plan Comments: (LMA/GETA backup discussed.  Patient consented for risks of anesthesia including but not limited to:  - adverse reactions to medications - damage to eyes, teeth, lips or other oral mucosa - nerve damage due to positioning  - sore throat or hoarseness - damage to heart, brain, nerves, lungs, other parts of body or loss of life  Informed patient about role of CRNA in peri- and intra-operative care.  Patient voiced understanding.)        Anesthesia Quick Evaluation

## 2023-03-13 NOTE — Anesthesia Postprocedure Evaluation (Signed)
Anesthesia Post Note  Patient: Krystal Payne  Procedure(s) Performed: COLONOSCOPY WITH PROPOFOL  Patient location during evaluation: PACU Anesthesia Type: General Level of consciousness: awake and alert, oriented and patient cooperative Pain management: pain level controlled Vital Signs Assessment: post-procedure vital signs reviewed and stable Respiratory status: spontaneous breathing, nonlabored ventilation and respiratory function stable Cardiovascular status: blood pressure returned to baseline and stable Postop Assessment: adequate PO intake Anesthetic complications: no   No notable events documented.   Last Vitals:  Vitals:   03/13/23 1411 03/13/23 1421  BP: 117/61 (!) 141/77  Pulse: 65 64  Resp: 19 20  Temp:    SpO2:  100%    Last Pain:  Vitals:   03/13/23 1431  TempSrc:   PainSc: (P) 0-No pain                 Reed Breech

## 2023-03-13 NOTE — Interval H&P Note (Signed)
History and Physical Interval Note:  03/13/2023 1:37 PM  Krystal Payne  has presented today for surgery, with the diagnosis of HX OF ADENOMATOUS POLYP OF COLON.  The various methods of treatment have been discussed with the patient and family. After consideration of risks, benefits and other options for treatment, the patient has consented to  Procedure(s): COLONOSCOPY WITH PROPOFOL (N/A) as a surgical intervention.  The patient's history has been reviewed, patient examined, no change in status, stable for surgery.  I have reviewed the patient's chart and labs.  Questions were answered to the patient's satisfaction.     Regis Bill  Ok to proceed with colonoscopy

## 2023-03-13 NOTE — H&P (Signed)
Outpatient short stay form Pre-procedure 03/13/2023  Regis Bill, MD  Primary Physician: Dorothey Baseman, MD  Reason for visit:  Surveillance  History of present illness:    77 y/o lady with history of breast cancer here for surveillance colonoscopy. Last colonoscopy in 2019 with small adenomatous polyps. No blood thinners. Brothers with colon cancer when they were older than 60. History of vaginal hysterectomy.    Current Facility-Administered Medications:    0.9 %  sodium chloride infusion, , Intravenous, Continuous, Birdie Beveridge, Rossie Muskrat, MD  Medications Prior to Admission  Medication Sig Dispense Refill Last Dose   aspirin EC 81 MG tablet Take 1 tablet (81 mg total) by mouth daily. 90 tablet 3 Past Week   bisoprolol (ZEBETA) 5 MG tablet Take 0.5 tablets (2.5 mg total) by mouth daily. 45 tablet 3 Past Week   hydrocortisone 2.5 % cream Apply topically as directed. Qd up to 5 days a week to scaly areas around nose and temples prn flares 30 g 2 Past Week   oxybutynin (DITROPAN) 5 MG tablet Take 5 mg by mouth daily.   Past Week   simvastatin (ZOCOR) 20 MG tablet Take 1.5 tablets (30 mg total) by mouth daily at 6 PM. 135 tablet 3 Past Week   cyclobenzaprine (FLEXERIL) 5 MG tablet Take 1 tablet by mouth daily as needed. (Patient not taking: Reported on 03/13/2023)   Not Taking     No Known Allergies   Past Medical History:  Diagnosis Date   Actinic keratosis    Arm fracture, left 1969   Basal cell carcinoma 05/11/2009   mid upper sternum   Basal cell carcinoma 12/10/2018   right upper nasolabial, excised 01/14/2019   Basal cell carcinoma 07/07/2020   Left mid upper back, [facility]   Basal cell carcinoma (BCC) of back 01/21/2020   L upper back, [facility]   Breast cancer (HCC)    left breast   Coronary artery disease involving native coronary artery of native heart without angina pectoris 08/05/2021   History of radiation therapy 01/25/12 - 03/09/12   left breast   Hyperlipidemia     Personal history of radiation therapy 2013   Urgency of urination     Review of systems:  Otherwise negative.    Physical Exam  Gen: Alert, oriented. Appears stated age.  HEENT: PERRLA. Lungs: No respiratory distress CV: RRR Abd: soft, benign, no masses Ext: No edema    Planned procedures: Proceed with colonoscopy. The patient understands the nature of the planned procedure, indications, risks, alternatives and potential complications including but not limited to bleeding, infection, perforation, damage to internal organs and possible oversedation/side effects from anesthesia. The patient agrees and gives consent to proceed.  Please refer to procedure notes for findings, recommendations and patient disposition/instructions.     Regis Bill, MD [facility]

## 2023-03-13 NOTE — Op Note (Signed)
[facility] Patient Name: Krystal Payne Procedure Date: 03/13/2023 1:39 PM MRN: [id number] Account #: [id number] Date of Birth: [birth year] Admit Type: Outpatient Age: 77 Room: [facility] ENDO ROOM 3 Gender: Female Note Status: Finalized Instrument Name: Prentice Docker [id number] Procedure:             Colonoscopy Indications:           Surveillance: Personal history of adenomatous polyps                         on last colonoscopy 5 years ago, Family history of                         colon cancer in multiple first-degree relatives Providers:             Eather Colas MD, MD Medicines:             Monitored Anesthesia Care Complications:         No immediate complications. Procedure:             Pre-Anesthesia Assessment:                        - Prior to the procedure, a History and Physical was                         performed, and patient medications and allergies were                         reviewed. The patient is competent. The risks and                         benefits of the procedure and the sedation options and                         risks were discussed with the patient. All questions                         were answered and informed consent was obtained.                         Patient identification and proposed procedure were                         verified by the physician, the nurse, the                         anesthesiologist, the anesthetist and the technician                         in the endoscopy suite. Mental Status Examination:                         alert and oriented. Airway Examination: normal                         oropharyngeal airway and neck mobility. Respiratory                         Examination: clear to auscultation. CV Examination:  normal. Prophylactic Antibiotics: The patient does not                         require prophylactic antibiotics. Prior                          Anticoagulants: The patient has taken no anticoagulant                         or antiplatelet agents except for aspirin. ASA Grade                         Assessment: II - A patient with mild systemic disease.                         After reviewing the risks and benefits, the patient                         was deemed in satisfactory condition to undergo the                         procedure. The anesthesia plan was to use monitored                         anesthesia care (MAC). Immediately prior to                         administration of medications, the patient was                         re-assessed for adequacy to receive sedatives. The                         heart rate, respiratory rate, oxygen saturations,                         blood pressure, adequacy of pulmonary ventilation, and                         response to care were monitored throughout the                         procedure. The physical status of the patient was                         re-assessed after the procedure.                        After obtaining informed consent, the colonoscope was                         passed under direct vision. Throughout the procedure,                         the patient's blood pressure, pulse, and oxygen                         saturations were monitored continuously. The  Colonoscope was introduced through the anus and                         advanced to the the terminal ileum. The colonoscopy                         was performed without difficulty. The patient                         tolerated the procedure well. The quality of the bowel                         preparation was good. The terminal ileum, ileocecal                         valve, appendiceal orifice, and rectum were                         photographed. Findings:      The perianal and digital rectal examinations were normal.      The terminal ileum appeared normal.      Multiple  small-mouthed diverticula were found in the sigmoid colon,       descending colon, transverse colon, hepatic flexure and ascending colon.      Internal hemorrhoids were found during retroflexion. The hemorrhoids       were Grade I (internal hemorrhoids that do not prolapse).      The exam was otherwise without abnormality on direct and retroflexion       views. Impression:            - The examined portion of the ileum was normal.                        - Diverticulosis in the sigmoid colon, in the                         descending colon, in the transverse colon, at the                         hepatic flexure and in the ascending colon.                        - Internal hemorrhoids.                        - The examination was otherwise normal on direct and                         retroflexion views.                        - No specimens collected. Recommendation:        - Discharge patient to home.                        - Resume previous diet.                        - Continue present medications.                        -  Repeat colonoscopy in 5 years for surveillance.                        - Return to referring physician as previously                         scheduled. Procedure Code(s):     --- Professional ---                        F6213, Colorectal cancer screening; colonoscopy on                         individual at high risk Diagnosis Code(s):     --- Professional ---                        Z86.010, Personal history of colonic polyps                        K64.0, First degree hemorrhoids                        Z80.0, Family history of malignant neoplasm of                         digestive organs                        K57.30, Diverticulosis of large intestine without                         perforation or abscess without bleeding CPT copyright 2022 American Medical Association. All rights reserved. The codes documented in this report are preliminary and upon coder review  may  be revised to meet current compliance requirements. Eather Colas MD, MD 03/13/2023 2:03:08 PM Number of Addenda: 0 Note Initiated On: 03/13/2023 1:39 PM Scope Withdrawal Time: 0 hours 8 minutes 3 seconds  Total Procedure Duration: 0 hours 11 minutes 43 seconds  Estimated Blood Loss:  Estimated blood loss: none.      [facility]

## 2023-03-13 NOTE — Transfer of Care (Signed)
Immediate Anesthesia Transfer of Care Note  Patient: Krystal Payne  Procedure(s) Performed: COLONOSCOPY WITH PROPOFOL  Patient Location: PACU  Anesthesia Type:General  Level of Consciousness: drowsy  Airway & Oxygen Therapy: Patient Spontanous Breathing  Post-op Assessment: Report given to RN and Post -op Vital signs reviewed and stable  Post vital signs: Reviewed and stable  Last Vitals:  Vitals Value Taken Time  BP 120/55 03/13/23 1401  Temp 35.9 C 03/13/23 1401  Pulse 74 03/13/23 1401  Resp 21 03/13/23 1401  SpO2 96 % 03/13/23 1401    Last Pain:  Vitals:   03/13/23 1401  TempSrc: Temporal  PainSc: Asleep         Complications: No notable events documented.

## 2023-03-14 ENCOUNTER — Encounter: Payer: Self-pay | Admitting: Gastroenterology

## 2023-03-20 ENCOUNTER — Ambulatory Visit (INDEPENDENT_AMBULATORY_CARE_PROVIDER_SITE_OTHER): Payer: Self-pay | Admitting: Dermatology

## 2023-03-20 VITALS — BP 108/67 | HR 78

## 2023-03-20 DIAGNOSIS — L988 Other specified disorders of the skin and subcutaneous tissue: Secondary | ICD-10-CM

## 2023-03-20 NOTE — Progress Notes (Signed)
      Follow-Up Visit   Subjective  Krystal Payne is a 77 y.o. female who presents for the following: 3 week Botox follow-up of the glabella. Patient hasn't noticed much difference.    The following portions of the chart were reviewed this encounter and updated as appropriate: medications, allergies, medical history  Review of Systems:  No other skin or systemic complaints except as noted in HPI or Assessment and Plan.  Objective  Well appearing patient in no apparent distress; mood and affect are within normal limits.  A focused examination was performed of the following areas: Face  Relevant physical exam findings are noted in the Assessment and Plan.  Glabella Rhytides and volume loss. Still some movement at glabella, with deep medial wrinkle.  Some flattening noted when compared to baseline photo         Assessment & Plan   Elastosis of skin Glabella  Improved after Botox injections 02/27/23, but patient wants to try and soften deep wrinkle at glabella.  Emphasized that wrinkle will lessen over time with repeated botox treatments, as muscles weaken.  Photorejuvenation - Glabella Location: See attached image  Informed consent: Discussed risks (infection, pain, bleeding, bruising, swelling, allergic reaction, paralysis of nearby muscles, eyelid droop, double vision, neck weakness, difficulty breathing, headache, undesirable cosmetic result, and need for additional treatment) and benefits of the procedure, as well as the alternatives.  Informed consent was obtained.  Preparation: The area was cleansed with alcohol.  Procedure Details:  Botox was injected into the dermis with a 30-gauge needle. Pressure applied to any bleeding. Ice packs offered for swelling.  Lot Number:  [id number] Expiration:  02/2025  Total Units Injected:  10  Plan: Patient was instructed to remain upright for 4 hours. Patient was instructed to avoid massaging the face and avoid vigorous  exercise for the rest of the day. Tylenol may be used for headache.  Allow 2 weeks before returning to clinic for additional dosing as needed. Patient will call for any problems.     Return 4 mos botox.  ICherlyn Labella, CMA, am acting as scribe for Willeen Niece, MD .   Documentation: I have reviewed the above documentation for accuracy and completeness, and I agree with the above.  Willeen Niece, MD

## 2023-03-20 NOTE — Patient Instructions (Signed)
Due to recent changes in healthcare laws, you may see results of your pathology and/or laboratory studies on MyChart before the doctors have had a chance to review them. We understand that in some cases there may be results that are confusing or concerning to you. Please understand that not all results are received at the same time and often the doctors may need to interpret multiple results in order to provide you with the best plan of care or course of treatment. Therefore, we ask that you please give us 2 business days to thoroughly review all your results before contacting the office for clarification. Should we see a critical lab result, you will be contacted sooner.   If You Need Anything After Your Visit  If you have any questions or concerns for your doctor, please call our main line at [phone] and press option 4 to reach your doctor's medical assistant. If no one answers, please leave a voicemail as directed and we will return your call as soon as possible. Messages left after 4 pm will be answered the following business day.   You may also send us a message via MyChart. We typically respond to MyChart messages within 1-2 business days.  For prescription refills, please ask your pharmacy to contact our office. Our fax number is [phone].  If you have an urgent issue when the clinic is closed that cannot wait until the next business day, you can page your doctor at the number below.    Please note that while we do our best to be available for urgent issues outside of office hours, we are not available 24/7.   If you have an urgent issue and are unable to reach us, you may choose to seek medical care at your doctor's office, retail clinic, urgent care center, or emergency room.  If you have a medical emergency, please immediately call 911 or go to the emergency department.  Pager Numbers  - Dr. Kowalski: [phone]  - Dr. Moye: [phone]  - Dr. Stewart:  [phone]  In the event of inclement weather, please call our main line at [phone] for an update on the status of any delays or closures.  Dermatology Medication Tips: Please keep the boxes that topical medications come in in order to help keep track of the instructions about where and how to use these. Pharmacies typically print the medication instructions only on the boxes and not directly on the medication tubes.   If your medication is too expensive, please contact our office at [phone] option 4 or send us a message through MyChart.   We are unable to tell what your co-pay for medications will be in advance as this is different depending on your insurance coverage. However, we may be able to find a substitute medication at lower cost or fill out paperwork to get insurance to cover a needed medication.   If a prior authorization is required to get your medication covered by your insurance company, please allow us 1-2 business days to complete this process.  Drug prices often vary depending on where the prescription is filled and some pharmacies may offer cheaper prices.  The website www.goodrx.com contains coupons for medications through different pharmacies. The prices here do not account for what the cost may be with help from insurance (it may be cheaper with your insurance), but the website can give you the price if you did not use any insurance.  - You can print the associated coupon and take it with   your prescription to the pharmacy.  - You may also stop by our office during regular business hours and pick up a GoodRx coupon card.  - If you need your prescription sent electronically to a different pharmacy, notify our office through [facility] MyChart or by phone at [phone] option 4.     Si Usted Necesita Algo Despus de Su Visita  Tambin puede enviarnos un mensaje a travs de MyChart. Por lo general respondemos a los mensajes de MyChart en el transcurso de 1 a 2  das hbiles.  Para renovar recetas, por favor pida a su farmacia que se ponga en contacto con nuestra oficina. Nuestro nmero de fax es el [phone].  Si tiene un asunto urgente cuando la clnica est cerrada y que no puede esperar hasta el siguiente da hbil, puede llamar/localizar a su doctor(a) al nmero que aparece a continuacin.   Por favor, tenga en cuenta que aunque hacemos todo lo posible para estar disponibles para asuntos urgentes fuera del horario de oficina, no estamos disponibles las 24 horas del da, los 7 das de la semana.   Si tiene un problema urgente y no puede comunicarse con nosotros, puede optar por buscar atencin mdica  en el consultorio de su doctor(a), en una clnica privada, en un centro de atencin urgente o en una sala de emergencias.  Si tiene una emergencia mdica, por favor llame inmediatamente al 911 o vaya a la sala de emergencias.  Nmeros de bper  - Dr. Kowalski: [phone]  - Dra. Moye: [phone]  - Dra. Stewart: [phone]  En caso de inclemencias del tiempo, por favor llame a nuestra lnea principal al [phone] para una actualizacin sobre el estado de cualquier retraso o cierre.  Consejos para la medicacin en dermatologa: Por favor, guarde las cajas en las que vienen los medicamentos de uso tpico para ayudarle a seguir las instrucciones sobre dnde y cmo usarlos. Las farmacias generalmente imprimen las instrucciones del medicamento slo en las cajas y no directamente en los tubos del medicamento.   Si su medicamento es muy caro, por favor, pngase en contacto con nuestra oficina llamando al [phone] y presione la opcin 4 o envenos un mensaje a travs de MyChart.   No podemos decirle cul ser su copago por los medicamentos por adelantado ya que esto es diferente dependiendo de la cobertura de su seguro. Sin embargo, es posible que podamos encontrar un medicamento sustituto a menor costo o llenar un formulario para que el  seguro cubra el medicamento que se considera necesario.   Si se requiere una autorizacin previa para que su compaa de seguros cubra su medicamento, por favor permtanos de 1 a 2 das hbiles para completar este proceso.  Los precios de los medicamentos varan con frecuencia dependiendo del lugar de dnde se surte la receta y alguna farmacias pueden ofrecer precios ms baratos.  El sitio web www.goodrx.com tiene cupones para medicamentos de diferentes farmacias. Los precios aqu no tienen en cuenta lo que podra costar con la ayuda del seguro (puede ser ms barato con su seguro), pero el sitio web puede darle el precio si no utiliz ningn seguro.  - Puede imprimir el cupn correspondiente y llevarlo con su receta a la farmacia.  - Tambin puede pasar por nuestra oficina durante el horario de atencin regular y recoger una tarjeta de cupones de GoodRx.  - Si necesita que su receta se enve electrnicamente a una farmacia diferente, informe a nuestra oficina a travs de MyChart de    o por telfono llamando al [phone] y presione la opcin 4.  

## 2023-04-10 ENCOUNTER — Ambulatory Visit: Payer: Medicare HMO | Admitting: Dermatology

## 2023-07-19 ENCOUNTER — Other Ambulatory Visit: Payer: Self-pay | Admitting: Family Medicine

## 2023-07-19 DIAGNOSIS — Z1231 Encounter for screening mammogram for malignant neoplasm of breast: Secondary | ICD-10-CM

## 2023-08-04 ENCOUNTER — Ambulatory Visit
Admission: RE | Admit: 2023-08-04 | Discharge: 2023-08-04 | Disposition: A | Discharge status: Home / Self Care | Payer: Medicare HMO | Source: Ambulatory Visit | Attending: Family Medicine | Admitting: Family Medicine

## 2023-08-04 DIAGNOSIS — Z1231 Encounter for screening mammogram for malignant neoplasm of breast: Secondary | ICD-10-CM

## 2023-09-19 ENCOUNTER — Telehealth: Payer: Self-pay | Admitting: Internal Medicine

## 2023-09-19 MED ORDER — SIMVASTATIN 20 MG PO TABS: 30.0000 mg | ORAL_TABLET | Freq: Every day | ORAL | 0 refills | Status: DC

## 2023-09-19 NOTE — Telephone Encounter (Signed)
last visit 11/21/22 with plan to f/u in 12 months  next visit: 11/22/23  Requested Prescriptions   Signed Prescriptions Disp Refills   simvastatin (ZOCOR) 20 MG tablet 135 tablet 0    Sig: Take 1.5 tablets (30 mg total) by mouth daily at 6 PM.    Authorizing Provider: END, CHRISTOPHER    Ordering User: Guerry Minors

## 2023-09-19 NOTE — Telephone Encounter (Signed)
*  STAT* If patient is at the pharmacy, call can be transferred to refill team.   1. Which medications need to be refilled? (please list name of each medication and dose if known) simvastatin (ZOCOR) 20 MG tablet  [facility] - [place], [place] - [facility] Portal to [facility] Phone: [phone]  Fax: [phone]      3. Do they need a 30 day or 90 day supply? 90

## 2023-11-22 ENCOUNTER — Encounter: Payer: Self-pay | Admitting: Internal Medicine

## 2023-11-22 ENCOUNTER — Ambulatory Visit: Payer: Medicare HMO | Attending: Internal Medicine | Admitting: Internal Medicine

## 2023-11-22 VITALS — BP 122/72 | HR 77 | Ht 65.0 in | Wt 166.4 lb

## 2023-11-22 DIAGNOSIS — E785 Hyperlipidemia, unspecified: Secondary | ICD-10-CM

## 2023-11-22 DIAGNOSIS — I2510 Atherosclerotic heart disease of native coronary artery without angina pectoris: Secondary | ICD-10-CM | POA: Diagnosis not present

## 2023-11-22 DIAGNOSIS — I471 Supraventricular tachycardia, unspecified: Secondary | ICD-10-CM | POA: Diagnosis not present

## 2023-11-22 DIAGNOSIS — Z79899 Other long term (current) drug therapy: Secondary | ICD-10-CM

## 2023-11-22 MED ORDER — BISOPROLOL FUMARATE 5 MG PO TABS: 2.5000 mg | ORAL_TABLET | Freq: Every day | ORAL | 3 refills | Status: DC

## 2023-11-22 MED ORDER — ATORVASTATIN CALCIUM 40 MG PO TABS: 40.0000 mg | ORAL_TABLET | Freq: Every day | ORAL | 3 refills | Status: DC

## 2023-11-22 NOTE — Patient Instructions (Signed)
Medication Instructions:  Your physician recommends the following medication changes.  STOP TAKING: Simvastatin   START TAKING: Atorvastatin 40 mg by mouth daily  *If you need a refill on your cardiac medications before your next appointment, please call your pharmacy*   Lab Work: Your provider would like for you to return in April, 2025 to have the following labs drawn: (Lipid, ALT).   Please go to [facility] [address] ([facility] [address], [place] [address] You do not need an appointment.  They are open from 8 am- 4:30 pm.  Lunch from 1:00 pm- 2:00 pm You will need to be fasting.   You may also go to one of the following [facility]:  [address] [place], [place] [address] Phone: [phone] Lab hours: Mon-Fri 8 am- 5 pm    Lunch 12 pm- 1 pm  [address] [place],  [place]  [address]  [place] Phone: [phone] Lab hours: 7 am- 4 pm Lunch 12 pm-1 pm   [address] [place],  [place]  [address]  [place] Phone: [phone] Lab hours: Mon-Fri 8 am- 5 pm    Lunch 12 pm- 1 pm    Testing/Procedures: No test ordered today    Follow-Up: At [facility], you and your health needs are our priority.  As part of our continuing mission to provide you with exceptional heart care, we have created designated Provider Care Teams.  These Care Teams include your primary Cardiologist (physician) and Advanced Practice Providers (APPs -  Physician Assistants and Nurse Practitioners) who all work together to provide you with the care you need, when you need it.  We recommend signing up for the patient portal called "MyChart".  Sign up information is provided on this After Visit Summary.  MyChart is used to connect with patients for Virtual Visits (Telemedicine).  Patients are able to view lab/test results, encounter notes, upcoming appointments, etc.  Non-urgent messages can be sent to your provider as well.   To learn more about what you can do with MyChart,  go to [email or web address].    Your next appointment:   1 year(s)  Provider:   You may see Yvonne Kendall, MD or one of the following Advanced Practice Providers on your designated Care Team:   Nicolasa Ducking, NP Eula Listen, PA-C Cadence Fransico Michael, PA-C Charlsie Quest, NP Carlos Levering, NP

## 2023-11-22 NOTE — Progress Notes (Signed)
Cardiology Office Note:  .   Date:  11/22/2023  ID:  Natily, Hull 05-01-1946, MRN [id number] PCP: Dorothey Baseman, MD  [facility] Providers Cardiologist:  Yvonne Kendall, MD     History of Present Illness: .   Krystal Payne is a 78 y.o. female with history of PSVT, coronary artery calcification with mild perfusion defect near the apex (favor small area of ischemia versus artifact), hyperlipidemia, and left breast cancer status postlumpectomy and radiation, who presents for follow-up of PSVT and hyperlipidemia.  She was last seen in our office a year ago by Charlsie Quest, NP, at which time she was feeling well.  No medication changes or additional testing were pursued.  Today, Ms. Kuhlmann reports that she has been feeling fairly well.  She denies palpitations as well as chest pain, shortness of breath, and lightheadedness.  She notes occasional ankle edema when she has been on her feet most of the day, though this resolves with elevation of the legs.  She had early sepsis secondary to a UTI in June, which was successfully managed with outpatient antibiotics.  She remains quite active at home but does not exercise regularly.  She notes that she has been taking simvastatin 40 mg daily.  She was on this dose when her last lipid panel was done by Dr. Terance Hart in 05/2023; LDL was above goal at 96.  ROS: See HPI  Studies Reviewed: Marland Kitchen   EKG Interpretation Date/Time:  Wednesday November 22 2023 10:58:43 EST Ventricular Rate:  77 PR Interval:    QRS Duration:  84 QT Interval:  368 QTC Calculation: 416 R Axis:   -35  Text Interpretation: Artifact Normal sinus rhythm Left axis deviation Minimal voltage criteria for LVH, may be normal variant ( R in aVL ) When compared with ECG of 21-Nov-2022 No significant change was found Confirmed by Jomo Forand, Cristal Deer [phone]) on 11/22/2023 2:00:14 PM    Risk Assessment/Calculations:             Physical Exam:   VS:  BP 122/72 (BP Location:  Left Arm, Patient Position: Sitting, Cuff Size: Normal)   Pulse 77   Ht 5\' 5"  (1.651 m)   Wt 166 lb 6.4 oz (75.5 kg)   SpO2 98%   BMI 27.69 kg/m    Wt Readings from Last 3 Encounters:  11/22/23 166 lb 6.4 oz (75.5 kg)  03/13/23 163 lb 11.8 oz (74.3 kg)  11/21/22 170 lb (77.1 kg)    General:  NAD. Neck: No JVD or HJR. Lungs: Clear to auscultation bilaterally without wheezes or crackles. Heart: Regular rate and rhythm without murmurs, rubs, or gallops. Abdomen: Soft, nontender, nondistended. Extremities: No lower extremity edema.  ASSESSMENT AND PLAN: .    SVT: No palpitations reported.  EKG today shows sinus rhythm.  We discussed role for using bisoprolol on a as needed basis, but given her numerous episodes of PSVT lasting over 4 hours at times on her previous event monitor, I have recommended continuation of bisoprolol 2.5 mg daily unless side effects develop.  Coronary artery disease and hyperlipidemia: No angina reported.  Most recent lipid panel on 05/03/2023 through Dr. Terance Hart was notable for total cholesterol 184, triglycerides 88, HDL 70, and LDL 96.  Given evidence of CAD on prior CT with coronary artery calcification and possible small area of apical ischemia, we have agreed to switch from simvastatin to atorvastatin 40 mg daily.  We will check a lipid panel and ALT in about 3 months  to assess response, targeting an LDL below 70.    Dispo: Return to clinic in 1 year.  Signed, Yvonne Kendall, MD

## 2023-12-05 ENCOUNTER — Other Ambulatory Visit: Payer: Self-pay | Admitting: Cardiology

## 2024-01-09 ENCOUNTER — Telehealth: Payer: Self-pay | Admitting: Internal Medicine

## 2024-01-09 ENCOUNTER — Other Ambulatory Visit: Payer: Self-pay | Admitting: Internal Medicine

## 2024-01-09 NOTE — Telephone Encounter (Signed)
 Called patient, advised that she tried to take the Atorvastatin- she took it for about a month, began having joint soreness, runny nose and watery eyes, she states she never has issues with allergies. However the symptoms did improve when she stopped the Atorvastatin, however she states she is still not felt the best even after switching back.   Patient did start back on the Simvastatin - advised she was asked to have blood work in April. Patient would like to know if this should be evaluated before then, or what would be the recommendations from Dr.End.   Advised I would route.   Thanks!

## 2024-01-09 NOTE — Telephone Encounter (Signed)
 Pt c/o medication issue:  1. Name of Medication:  atorvastatin (LIPITOR) 40 MG tablet  2. How are you currently taking this medication (dosage and times per day)?   3. Are you having a reaction (difficulty breathing--STAT)?   4. What is your medication issue?   Patient states she took it for 30 days and had soreness, runny nose, watery eyes. She states she stopped after 30 days and went back on Simvastatin 40 MG.

## 2024-01-10 NOTE — Telephone Encounter (Signed)
Called patient, advised of message below. Patient verbalized understanding.  

## 2024-01-10 NOTE — Telephone Encounter (Signed)
 I recommend that [name] continue her simvastatin, as she is doing, and keep [place] updated on her symptoms.  Yvonne Kendall, MD [facility]

## 2024-01-23 ENCOUNTER — Other Ambulatory Visit: Payer: Self-pay

## 2024-01-23 ENCOUNTER — Other Ambulatory Visit: Payer: Self-pay | Admitting: Internal Medicine

## 2024-01-23 ENCOUNTER — Telehealth: Payer: Self-pay | Admitting: Internal Medicine

## 2024-01-23 MED ORDER — ATORVASTATIN CALCIUM 40 MG PO TABS: 40.0000 mg | ORAL_TABLET | Freq: Every day | ORAL | 3 refills | Status: DC

## 2024-01-23 NOTE — Telephone Encounter (Signed)
*  STAT* If patient is at the pharmacy, call can be transferred to refill team.   1. Which medications need to be refilled? (please list name of each medication and dose if known)   simvastatin (ZOCOR) 40 MG tablet    2. Would you like to learn more about the convenience, safety, & potential cost savings by using the [facility]?   3. Are you open to using the [facility] (Type [facility]. ).  4. Which pharmacy/location (including street and city if local pharmacy) is medication to be sent to?  [id number] [place] [place] - [address]   5. Do they need a 30 day or 90 day supply?   90 day  Patient stated she only has 5 tablets left.

## 2024-01-29 ENCOUNTER — Telehealth: Payer: Self-pay | Admitting: Internal Medicine

## 2024-01-29 MED ORDER — SIMVASTATIN 20 MG PO TABS: 30.0000 mg | ORAL_TABLET | Freq: Every day | ORAL | 1 refills | Status: DC

## 2024-01-29 NOTE — Telephone Encounter (Signed)
*  STAT* If patient is at the pharmacy, call can be transferred to refill team.   1. Which medications need to be refilled? (please list name of each medication and dose if known) Simvastatin - need a new prescription for  local pharmacy  2. Would you like to learn more about the convenience, safety, & potential cost savings by using the [facility]?      3. Are you open to using the [facility] ([facility].    4. Which pharmacy/location (including street and city if local pharmacy) is medication to be sent to? [facility] [place]   5. Do they need a 30 day or 90 day supply? # 30 - please call in asap- she is out of her medicine and she wants to get it before the storm comes

## 2024-02-08 ENCOUNTER — Other Ambulatory Visit: Payer: Self-pay | Admitting: Internal Medicine

## 2024-02-20 ENCOUNTER — Ambulatory Visit (INDEPENDENT_AMBULATORY_CARE_PROVIDER_SITE_OTHER): Payer: Medicare HMO | Admitting: Dermatology

## 2024-02-20 DIAGNOSIS — L821 Other seborrheic keratosis: Secondary | ICD-10-CM

## 2024-02-20 DIAGNOSIS — W908XXA Exposure to other nonionizing radiation, initial encounter: Secondary | ICD-10-CM

## 2024-02-20 DIAGNOSIS — L578 Other skin changes due to chronic exposure to nonionizing radiation: Secondary | ICD-10-CM | POA: Diagnosis not present

## 2024-02-20 DIAGNOSIS — D229 Melanocytic nevi, unspecified: Secondary | ICD-10-CM

## 2024-02-20 DIAGNOSIS — L570 Actinic keratosis: Secondary | ICD-10-CM | POA: Diagnosis not present

## 2024-02-20 DIAGNOSIS — D2272 Melanocytic nevi of left lower limb, including hip: Secondary | ICD-10-CM

## 2024-02-20 DIAGNOSIS — Z1283 Encounter for screening for malignant neoplasm of skin: Secondary | ICD-10-CM

## 2024-02-20 DIAGNOSIS — L820 Inflamed seborrheic keratosis: Secondary | ICD-10-CM | POA: Diagnosis not present

## 2024-02-20 DIAGNOSIS — Z85828 Personal history of other malignant neoplasm of skin: Secondary | ICD-10-CM

## 2024-02-20 DIAGNOSIS — D1801 Hemangioma of skin and subcutaneous tissue: Secondary | ICD-10-CM

## 2024-02-20 DIAGNOSIS — L814 Other melanin hyperpigmentation: Secondary | ICD-10-CM | POA: Diagnosis not present

## 2024-02-20 DIAGNOSIS — D692 Other nonthrombocytopenic purpura: Secondary | ICD-10-CM

## 2024-02-20 DIAGNOSIS — D225 Melanocytic nevi of trunk: Secondary | ICD-10-CM

## 2024-02-20 NOTE — Progress Notes (Signed)
 Follow-Up Visit   Subjective  Krystal Payne is a 78 y.o. female who presents for the following: Skin Cancer Screening and Full Body Skin Exam Hx bcc, hx of aks, hx of isks Hx of nevus   Itchy spots at R back near bra   The patient presents for Total-Body Skin Exam (TBSE) for skin cancer screening and mole check. The patient has spots, moles and lesions to be evaluated, some may be new or changing and the patient may have concern these could be cancer.    The following portions of the chart were reviewed this encounter and updated as appropriate: medications, allergies, medical history  Review of Systems:  No other skin or systemic complaints except as noted in HPI or Assessment and Plan.  Objective  Well appearing patient in no apparent distress; mood and affect are within normal limits.  A full examination was performed including scalp, head, eyes, ears, nose, lips, neck, chest, axillae, abdomen, back, buttocks, bilateral upper extremities, bilateral lower extremities, hands, feet, fingers, toes, fingernails, and toenails. All findings within normal limits unless otherwise noted below.   Relevant physical exam findings are noted in the Assessment and Plan.  right mid back x 4 (4) Erythematous stuck-on, waxy papule  posterior crown scalp x 2 (2) Erythematous thin papules/macules with gritty scale.   Assessment & Plan   SKIN CANCER SCREENING PERFORMED TODAY.  ACTINIC DAMAGE - Chronic condition, secondary to cumulative UV/sun exposure - diffuse scaly erythematous macules with underlying dyspigmentation - Recommend daily broad spectrum sunscreen SPF 30+ to sun-exposed areas, reapply every 2 hours as needed.  - Staying in the shade or wearing long sleeves, sun glasses (UVA+UVB protection) and wide brim hats (4-inch brim around the entire circumference of the hat) are also recommended for sun protection.  - Call for new or changing lesions.  LENTIGINES, SEBORRHEIC  KERATOSES, HEMANGIOMAS - Benign normal skin lesions - Benign-appearing - Call for any changes  MELANOCYTIC NEVI - Tan-brown and/or pink-flesh-colored symmetric macules and papules - R upper buttock   2.0 x 3.0 mm brown macule, stable - Left plantar foot   3 mm light brown macule  - Benign appearing on exam today - Observation - Call clinic for new or changing moles - Recommend daily use of broad spectrum spf 30+ sunscreen to sun-exposed areas.    Purpura - Chronic; persistent and recurrent.  Treatable, but not curable. - Violaceous macules and patches - Benign - Related to trauma, age, sun damage and/or use of blood thinners, chronic use of topical and/or oral steroids - Observe - Can use OTC arnica containing moisturizer such as Dermend Bruise Formula if desired - Call for worsening or other concerns   HISTORY OF BASAL CELL CARCINOMA OF THE SKIN Multiple locations see history - No evidence of recurrence today - Recommend regular full body skin exams - Recommend daily broad spectrum sunscreen SPF 30+ to sun-exposed areas, reapply every 2 hours as needed.  - Call if any new or changing lesions are noted between office visits   INFLAMED SEBORRHEIC KERATOSIS (4) right mid back x 4 (4) Symptomatic, irritating, patient would like treated. Destruction of lesion - right mid back x 4 (4)  Destruction method: cryotherapy   Informed consent: discussed and consent obtained   Lesion destroyed using liquid nitrogen: Yes   Region frozen until ice ball extended beyond lesion: Yes   Outcome: patient tolerated procedure well with no complications   Post-procedure details: wound care instructions given   Additional  details:  Prior to procedure, discussed risks of blister formation, small wound, skin dyspigmentation, or rare scar following cryotherapy. Recommend Vaseline ointment to treated areas while healing.  ACTINIC KERATOSIS (2) posterior crown scalp x 2 (2) Actinic keratoses are  precancerous spots that appear secondary to cumulative UV radiation exposure/sun exposure over time. They are chronic with expected duration over 1 year. A portion of actinic keratoses will progress to squamous cell carcinoma of the skin. It is not possible to reliably predict which spots will progress to skin cancer and so treatment is recommended to prevent development of skin cancer.  Recommend daily broad spectrum sunscreen SPF 30+ to sun-exposed areas, reapply every 2 hours as needed.  Recommend staying in the shade or wearing long sleeves, sun glasses (UVA+UVB protection) and wide brim hats (4-inch brim around the entire circumference of the hat). Call for new or changing lesions. Destruction of lesion - posterior crown scalp x 2 (2)  Destruction method: cryotherapy   Informed consent: discussed and consent obtained   Lesion destroyed using liquid nitrogen: Yes   Region frozen until ice ball extended beyond lesion: Yes   Outcome: patient tolerated procedure well with no complications   Post-procedure details: wound care instructions given   Additional details:  Prior to procedure, discussed risks of blister formation, small wound, skin dyspigmentation, or rare scar following cryotherapy. Recommend Vaseline ointment to treated areas while healing.  Return in about 1 year (around 02/19/2025) for TBSE.  I, Randee Busing, CMA, am acting as scribe for Artemio Larry, MD.   Documentation: I have reviewed the above documentation for accuracy and completeness, and I agree with the above.  Artemio Larry, MD

## 2024-02-20 NOTE — Patient Instructions (Addendum)

## 2024-02-22 ENCOUNTER — Telehealth: Payer: Self-pay | Admitting: Internal Medicine

## 2024-02-22 MED ORDER — SIMVASTATIN 20 MG PO TABS: 30.0000 mg | ORAL_TABLET | Freq: Every day | ORAL | 2 refills | Status: DC

## 2024-02-22 NOTE — Telephone Encounter (Signed)
*  STAT* If patient is at the pharmacy, call can be transferred to refill team.   1. Which medications need to be refilled? (please list name of each medication and dose if known) simvastatin  (ZOCOR ) 20 MG tablet   2. Which pharmacy/location (including street and city if local pharmacy) is medication to be sent to?  [facility] - [place], [place] - [facility] AT [facility] to [facility]      3. Do they need a 30 day or 90 day supply? 90 day    Pt is out of medication

## 2024-05-28 ENCOUNTER — Ambulatory Visit: Admitting: Dermatology

## 2024-05-28 DIAGNOSIS — L219 Seborrheic dermatitis, unspecified: Secondary | ICD-10-CM | POA: Diagnosis not present

## 2024-05-28 DIAGNOSIS — Z85828 Personal history of other malignant neoplasm of skin: Secondary | ICD-10-CM | POA: Diagnosis not present

## 2024-05-28 DIAGNOSIS — L719 Rosacea, unspecified: Secondary | ICD-10-CM | POA: Diagnosis not present

## 2024-05-28 MED ORDER — METRONIDAZOLE 0.75 % EX CREA: TOPICAL_CREAM | CUTANEOUS | 11 refills | Status: AC

## 2024-05-28 MED ORDER — KETOCONAZOLE 2 % EX CREA: 1.0000 | TOPICAL_CREAM | Freq: Two times a day (BID) | CUTANEOUS | 3 refills | Status: AC

## 2024-05-28 MED ORDER — HYDROCORTISONE 2.5 % EX CREA
TOPICAL_CREAM | CUTANEOUS | 2 refills | Status: AC
Start: 2024-05-28 — End: ?

## 2024-05-28 NOTE — Patient Instructions (Addendum)
 Seborrheic Dermatitis  Mix hydrocortisone  with ketaconazole 2% twice a day. If improved, decrease to hydrocortisone  and ketaconazole mixed once a day. If still clear, decrease to ketaconazole only.   Due to recent changes in healthcare laws, you may see results of your pathology and/or laboratory studies on MyChart before the doctors have had a chance to review them. We understand that in some cases there may be results that are confusing or concerning to you. Please understand that not all results are received at the same time and often the doctors may need to interpret multiple results in order to provide you with the best plan of care or course of treatment. Therefore, we ask that you please give us  2 business days to thoroughly review all your results before contacting the office for clarification. Should we see a critical lab result, you will be contacted sooner.   If You Need Anything After Your Visit  If you have any questions or concerns for your doctor, please call our main line at [phone] and press option 4 to reach your doctor's medical assistant. If no one answers, please leave a voicemail as directed and we will return your call as soon as possible. Messages left after 4 pm will be answered the following business day.   You may also send us  a message via MyChart. We typically respond to MyChart messages within 1-2 business days.  For prescription refills, please ask your pharmacy to contact our office. Our fax number is [phone].  If you have an urgent issue when the clinic is closed that cannot wait until the next business day, you can page your doctor at the number below.    Please note that while we do our best to be available for urgent issues outside of office hours, we are not available 24/7.   If you have an urgent issue and are unable to reach us , you may choose to seek medical care at your doctor's office, retail clinic, urgent care center, or emergency room.  If  you have a medical emergency, please immediately call 911 or go to the emergency department.  Pager Numbers  - Dr. Hester: [phone]  - Dr. Jackquline: [phone]  - Dr. Claudene: [phone]   In the event of inclement weather, please call our main line at [phone] for an update on the status of any delays or closures.  Dermatology Medication Tips: Please keep the boxes that topical medications come in in order to help keep track of the instructions about where and how to use these. Pharmacies typically print the medication instructions only on the boxes and not directly on the medication tubes.   If your medication is too expensive, please contact our office at [phone] option 4 or send us  a message through MyChart.   We are unable to tell what your co-pay for medications will be in advance as this is different depending on your insurance coverage. However, we may be able to find a substitute medication at lower cost or fill out paperwork to get insurance to cover a needed medication.   If a prior authorization is required to get your medication covered by your insurance company, please allow us  1-2 business days to complete this process.  Drug prices often vary depending on where the prescription is filled and some pharmacies may offer cheaper prices.  The website www.goodrx.com contains coupons for medications through different pharmacies. The prices here do not account for what the cost may be with help from insurance (it may  be cheaper with your insurance), but the website can give you the price if you did not use any insurance.  - You can print the associated coupon and take it with your prescription to the pharmacy.  - You may also stop by our office during regular business hours and pick up a GoodRx coupon card.  - If you need your prescription sent electronically to a different pharmacy, notify our office through [facility] or by phone at [phone] option  4.     Si Usted Necesita Algo Despus de Su Visita  Tambin puede enviarnos un mensaje a travs de Clinical cytogeneticist. Por lo general respondemos a los mensajes de MyChart en el transcurso de 1 a 2 das hbiles.  Para renovar recetas, por favor pida a su farmacia que se ponga en contacto con nuestra oficina. Randi lakes de fax es [place] [phone].  Si tiene un asunto urgente cuando la clnica est cerrada y que no puede esperar hasta el siguiente da hbil, puede llamar/localizar a su doctor(a) al nmero que aparece a continuacin.   Por favor, tenga en cuenta que aunque hacemos todo lo posible para estar disponibles para asuntos urgentes fuera del horario de [place], no estamos disponibles las 24 horas del da, los 7 [address]  [address] de la [place].   Si tiene un problema urgente y no puede comunicarse con nosotros, puede optar por buscar atencin mdica  en el consultorio de su doctor(a), en una clnica privada, en un centro de atencin urgente o en una sala de emergencias.  Si tiene Engineer, drilling, por favor llame inmediatamente al 911 o vaya a la sala de emergencias.  Nmeros de bper  - Dr. Hester: [phone]  - Dra. Jackquline: [phone]  - Dr. Claudene: [phone]   En caso de inclemencias del tiempo, por favor llame a landry capes principal al [phone] para una actualizacin sobre el [place] de cualquier retraso o cierre.  Consejos para la medicacin en dermatologa: Por favor, guarde las cajas en las que vienen los medicamentos de uso tpico para ayudarle a seguir las instrucciones sobre dnde y cmo usarlos. Las farmacias generalmente imprimen las instrucciones del medicamento slo en las cajas y no directamente en los tubos del [place].   Si su medicamento es muy caro, por favor, pngase en contacto con landry rieger llamando al [phone] y presione la opcin 4 o envenos un mensaje a travs de Clinical cytogeneticist.   No podemos decirle cul ser su copago por los medicamentos por  adelantado ya que esto es diferente dependiendo de la cobertura de su seguro. Sin embargo, es posible que podamos encontrar un medicamento sustituto a Audiological scientist un formulario para que el seguro cubra el medicamento que se considera necesario.   Si se requiere una autorizacin previa para que su compaa de seguros malta su medicamento, por favor permtanos de 1 a 2 das hbiles para completar este proceso.  Los precios de los medicamentos varan con frecuencia dependiendo del Environmental consultant de dnde se surte la receta y alguna farmacias pueden ofrecer precios ms baratos.  El sitio web www.goodrx.com tiene cupones para medicamentos de Health and safety inspector. Los precios aqu no tienen en cuenta lo que podra costar con la ayuda del seguro (puede ser ms barato con su seguro), pero el sitio web puede darle el precio si no utiliz Tourist information centre manager.  - Puede imprimir el cupn correspondiente y llevarlo con su receta a la farmacia.  - Tambin puede pasar por nuestra oficina durante el horario de atencin regular  y recoger una tarjeta de cupones de GoodRx.  - Si necesita que su receta se enve electrnicamente a una farmacia diferente, informe a nuestra oficina a travs de MyChart de [place] o por telfono llamando al [phone] y presione la opcin 4.

## 2024-05-28 NOTE — Progress Notes (Signed)
   Follow-Up Visit   Subjective  Krystal Payne is a 78 y.o. female who presents for the following: check area on the right nose, noticed Saturday after she had been working outside, has been using cortisone. No itch, no pain, no bleeding. History of BCCs.  The patient has spots, moles and lesions to be evaluated, some may be new or changing.  This patient is accompanied in the office by her grandson.  The following portions of the chart were reviewed this encounter and updated as appropriate: medications, allergies, medical history  Review of Systems:  No other skin or systemic complaints except as noted in HPI or Assessment and Plan.  Objective  Well appearing patient in no apparent distress; mood and affect are within normal limits.  A focused examination was performed of the following areas: Face  Relevant physical exam findings are noted in the Assessment and Plan.    Assessment & Plan  SEBORRHEIC DERMATITIS Exam: Pink patches with mild scale at lower R nasal dorsum and alar crease  Chronic and persistent condition with duration or expected duration over one year. Condition is bothersome/symptomatic for patient. Currently flared.   Seborrheic Dermatitis is a chronic persistent rash characterized by pinkness and scaling most commonly of the mid face but also can occur on the scalp (dandruff), ears; mid chest, mid back and groin.  It tends to be exacerbated by stress and cooler weather.  People who have neurologic disease may experience new onset or exacerbation of existing seborrheic dermatitis.  The condition is not curable but treatable and can be controlled.  Treatment Plan:  Mix hydrocortisone  with ketaconazole 2% twice a day. If improved, decrease to hydrocortisone  and ketaconazole mixed once a day. If still clear, decrease to ketaconazole only.  RTC if not improved after a month  ROSACEA Exam Mild erythema nose, malar cheeks, and chin with inflammatory papule nasal  dorsum  Chronic and persistent condition with duration or expected duration over one year. Condition is symptomatic/ bothersome to patient. Not currently at goal.   Rosacea is a chronic progressive skin condition usually affecting the face of adults, causing redness and/or acne bumps. It is treatable but not curable. It sometimes affects the eyes (ocular rosacea) as well. It may respond to topical and/or systemic medication and can flare with stress, sun exposure, alcohol, exercise, topical steroids (including hydrocortisone /cortisone 10) and some foods.  Daily application of broad spectrum spf 30+ sunscreen to face is recommended to reduce flares.  Treatment Plan Restart metronidazole  0.75% cream once to twice daily for rosacea. Apply sunscreen daily    SEBORRHEIC DERMATITIS   Related Medications hydrocortisone  2.5 % cream Apply once to twice daily as directed to aa face.   Return as scheduled May 2026, for TBSE.  IAndrea Kerns, CMA, am acting as scribe for Rexene Rattler, MD .   Documentation: I have reviewed the above documentation for accuracy and completeness, and I agree with the above.  Rexene Rattler, MD

## 2024-09-24 ENCOUNTER — Other Ambulatory Visit: Payer: Self-pay | Admitting: Internal Medicine

## 2024-09-25 NOTE — Telephone Encounter (Signed)
 The prescription should be for simvastatin  40 mg daily.  Please modify the prescription to reflect this.  Thanks.  Lonni Hanson, MD [facility]

## 2024-09-25 NOTE — Telephone Encounter (Signed)
 Informed patient to start taking 40mg  of Simvastatin . Made follow up appt with Dr End for 11/28/2023.

## 2024-10-01 ENCOUNTER — Ambulatory Visit: Admitting: Urology

## 2024-10-01 VITALS — BP 102/67 | HR 76 | Wt 170.0 lb

## 2024-10-01 DIAGNOSIS — R102 Pelvic and perineal pain unspecified side: Secondary | ICD-10-CM

## 2024-10-01 DIAGNOSIS — N200 Calculus of kidney: Secondary | ICD-10-CM

## 2024-10-01 NOTE — Patient Instructions (Signed)
 Krystal Payne

## 2024-10-01 NOTE — Progress Notes (Signed)
   10/01/2024 2:26 PM   Krystal Payne Feb 02, 1946 [id number]  Reason for visit: Nephrolithiasis  History: Seen in urgent care mid November for sensation in the left groin, she describes this as a pulsing sound that she could hear.  She was not having any pain or urinary symptoms KUB showed nonobstructive left-sided renal stones, those images are unavailable to personally review Urine ultimately grew Proteus and she was treated with antibiotics which resolved her symptoms  Physical Exam: BP 102/67 (BP Location: Left Arm, Patient Position: Sitting, Cuff Size: Normal)   Pulse 76   Wt 170 lb (77.1 kg)   SpO2 97%   BMI 28.29 kg/m   Imaging/labs: Unable to personally review outside KUB, reported nonobstructive left renal stones largest 7 mm  Today: Denies any complaints today, specifically no gross hematuria or dysuria  Plan:   I was frank with the patient that I do not have a good explanation for her symptoms, may have just been UTI related or MSK.  Stone prevention strategies reviewed and return precautions discussed, will repeat KUB 1 year to confirm stability of reported left-sided renal stones   Redell JAYSON Burnet, MD  [facility] [address], [address] [place], [place] [address] [phone]

## 2024-10-13 ENCOUNTER — Other Ambulatory Visit: Payer: Self-pay | Admitting: Internal Medicine

## 2024-11-11 ENCOUNTER — Other Ambulatory Visit: Payer: Self-pay | Admitting: Family Medicine

## 2024-11-11 DIAGNOSIS — Z1231 Encounter for screening mammogram for malignant neoplasm of breast: Secondary | ICD-10-CM

## 2024-11-14 ENCOUNTER — Encounter: Payer: Self-pay | Admitting: Ophthalmology

## 2024-11-18 NOTE — Discharge Instructions (Signed)

## 2024-11-20 ENCOUNTER — Other Ambulatory Visit: Payer: Self-pay

## 2024-11-20 ENCOUNTER — Ambulatory Visit: Payer: Self-pay | Admitting: Anesthesiology

## 2024-11-20 ENCOUNTER — Ambulatory Visit
Admission: RE | Admit: 2024-11-20 | Discharge: 2024-11-20 | Disposition: A | Discharge status: Home / Self Care | Attending: Ophthalmology | Admitting: Ophthalmology

## 2024-11-20 ENCOUNTER — Encounter: Payer: Self-pay | Admitting: Ophthalmology

## 2024-11-20 ENCOUNTER — Encounter
Admission: RE | Disposition: A | Discharge status: Home / Self Care | Payer: Self-pay | Source: Home / Self Care | Attending: Ophthalmology

## 2024-11-20 DIAGNOSIS — Z7982 Long term (current) use of aspirin: Secondary | ICD-10-CM | POA: Diagnosis not present

## 2024-11-20 DIAGNOSIS — Z79899 Other long term (current) drug therapy: Secondary | ICD-10-CM | POA: Diagnosis not present

## 2024-11-20 DIAGNOSIS — Z87891 Personal history of nicotine dependence: Secondary | ICD-10-CM | POA: Diagnosis not present

## 2024-11-20 DIAGNOSIS — H2511 Age-related nuclear cataract, right eye: Secondary | ICD-10-CM | POA: Diagnosis present

## 2024-11-20 DIAGNOSIS — E785 Hyperlipidemia, unspecified: Secondary | ICD-10-CM | POA: Insufficient documentation

## 2024-11-20 HISTORY — DX: Personal history of urinary calculi: Z87.442

## 2024-11-20 HISTORY — DX: Cardiac arrhythmia, unspecified: I49.9

## 2024-11-20 HISTORY — PX: CATARACT EXTRACTION W/PHACO: SHX586

## 2024-11-20 HISTORY — DX: Unspecified osteoarthritis, unspecified site: M19.90

## 2024-11-20 MED ORDER — LACTATED RINGERS IV SOLN: INTRAVENOUS | Status: DC

## 2024-11-20 MED ORDER — LIDOCAINE HCL (PF) 2 % IJ SOLN
INTRAOCULAR | Status: DC | PRN
  Administered 2024-11-20: 2 mL

## 2024-11-20 MED ORDER — PHENYLEPHRINE HCL 10 % OP SOLN
OPHTHALMIC | Status: AC
  Filled 2024-11-20: qty 5

## 2024-11-20 MED ORDER — TETRACAINE HCL 0.5 % OP SOLN
1.0000 [drp] | OPHTHALMIC | Status: DC | PRN
  Administered 2024-11-20 (×3): 1 [drp] via OPHTHALMIC

## 2024-11-20 MED ORDER — PHENYLEPHRINE HCL 10 % OP SOLN
1.0000 [drp] | OPHTHALMIC | Status: AC | PRN
  Administered 2024-11-20 (×3): 1 [drp] via OPHTHALMIC

## 2024-11-20 MED ORDER — SIGHTPATH DOSE#1 BSS IO SOLN
INTRAOCULAR | Status: DC | PRN
  Administered 2024-11-20: 76 mL via OPHTHALMIC

## 2024-11-20 MED ORDER — CEFUROXIME OPHTHALMIC INJECTION 1 MG/0.1 ML
INJECTION | OPHTHALMIC | Status: DC | PRN
  Administered 2024-11-20: 1 mg via INTRACAMERAL

## 2024-11-20 MED ORDER — SIGHTPATH DOSE#1 BSS IO SOLN
INTRAOCULAR | Status: DC | PRN
  Administered 2024-11-20: 15 mL via INTRAOCULAR

## 2024-11-20 MED ORDER — TETRACAINE HCL 0.5 % OP SOLN
OPHTHALMIC | Status: AC
  Filled 2024-11-20: qty 4

## 2024-11-20 MED ORDER — MIDAZOLAM HCL (PF) 2 MG/2ML IJ SOLN
INTRAMUSCULAR | Status: DC | PRN
  Administered 2024-11-20: 2 mg via INTRAVENOUS

## 2024-11-20 MED ORDER — CYCLOPENTOLATE HCL 2 % OP SOLN
OPHTHALMIC | Status: AC
  Filled 2024-11-20: qty 2

## 2024-11-20 MED ORDER — CYCLOPENTOLATE HCL 2 % OP SOLN
1.0000 [drp] | OPHTHALMIC | Status: AC | PRN
  Administered 2024-11-20 (×3): 1 [drp] via OPHTHALMIC

## 2024-11-20 MED ORDER — MIDAZOLAM HCL 2 MG/2ML IJ SOLN
INTRAMUSCULAR | Status: AC
  Filled 2024-11-20: qty 2

## 2024-11-20 MED ORDER — SIGHTPATH DOSE#1 NA HYALUR & NA CHOND-NA HYALUR IO KIT
PACK | INTRAOCULAR | Status: DC | PRN
  Administered 2024-11-20: 1 via OPHTHALMIC

## 2024-11-20 MED ORDER — BRIMONIDINE TARTRATE-TIMOLOL 0.2-0.5 % OP SOLN
OPHTHALMIC | Status: DC | PRN
  Administered 2024-11-20: 1 [drp] via OPHTHALMIC

## 2024-11-20 NOTE — Anesthesia Postprocedure Evaluation (Signed)
"   Anesthesia Post Note  Patient: Krystal Payne  Procedure(s) Performed: PHACOEMULSIFICATION, CATARACT, WITH IOL INSERTION 3.76 00:33.5 (Right: Eye)  Patient location during evaluation: PACU Anesthesia Type: MAC Level of consciousness: awake and alert Pain management: pain level controlled Vital Signs Assessment: post-procedure vital signs reviewed and stable Respiratory status: spontaneous breathing, nonlabored ventilation, respiratory function stable and patient connected to nasal cannula oxygen Cardiovascular status: blood pressure returned to baseline and stable Postop Assessment: no apparent nausea or vomiting Anesthetic complications: no   No notable events documented.   Last Vitals:  Vitals:   11/20/24 1232 11/20/24 1237  BP: 97/68 110/64  Pulse: 66 66  Resp: 14 15  Temp: (!) 36.2 C (!) 36.2 C  SpO2: 100% 96%    Last Pain:  Vitals:   11/20/24 1237  PainSc: 0-No pain                 Fairy A Harding Thomure      "

## 2024-11-20 NOTE — Anesthesia Preprocedure Evaluation (Signed)
"                                    Anesthesia Evaluation  Patient identified by MRN, date of birth, ID band Patient awake    Reviewed: Allergy & Precautions, H&P , NPO status , Patient's Chart, lab work & pertinent test results  Airway Mallampati: II  TM Distance: >3 FB Neck ROM: Full    Dental no notable dental hx. (+) Teeth Intact   Pulmonary neg pulmonary ROS, former smoker   Pulmonary exam normal breath sounds clear to auscultation       Cardiovascular negative cardio ROS Normal cardiovascular exam Rhythm:Regular Rate:Normal     Neuro/Psych negative neurological ROS  negative psych ROS   GI/Hepatic negative GI ROS, Neg liver ROS,,,  Endo/Other  negative endocrine ROS    Renal/GU negative Renal ROS  negative genitourinary   Musculoskeletal negative musculoskeletal ROS (+)    Abdominal   Peds negative pediatric ROS (+)  Hematology negative hematology ROS (+)   Anesthesia Other Findings   Reproductive/Obstetrics negative OB ROS                              Anesthesia Physical Anesthesia Plan  ASA: 2  Anesthesia Plan: MAC   Post-op Pain Management:    Induction: Intravenous  PONV Risk Score and Plan:   Airway Management Planned:   Additional Equipment:   Intra-op Plan:   Post-operative Plan: Extubation in OR  Informed Consent: I have reviewed the patients History and Physical, chart, labs and discussed the procedure including the risks, benefits and alternatives for the proposed anesthesia with the patient or authorized representative who has indicated his/her understanding and acceptance.     Dental advisory given  Plan Discussed with: CRNA  Anesthesia Plan Comments:         Anesthesia Quick Evaluation  "

## 2024-11-20 NOTE — H&P (Signed)
 " [facility]   Primary Care Physician:  Glover Lenis, MD Ophthalmologist: Dr. Dene Etienne  Pre-Procedure History & Physical: HPI:  Krystal Payne is a 79 y.o. female here for ophthalmic surgery.   Past Medical History:  Diagnosis Date   Actinic keratosis    Arm fracture, left 1969   Arthritis    Basal cell carcinoma 05/11/2009   mid upper sternum   Basal cell carcinoma 12/10/2018   right upper nasolabial, excised 01/14/2019   Basal cell carcinoma 07/07/2020   Left mid upper back, EDC   Basal cell carcinoma (BCC) of back 01/21/2020   L upper back, EDC   Breast cancer (HCC)    left breast   Coronary artery disease involving native coronary artery of native heart without angina pectoris 08/05/2021   Dysrhythmia    tachycardia- no episodes in 2 years   History of kidney stones    History of radiation therapy 01/25/12 - 03/09/12   left breast   Hyperlipidemia    Personal history of radiation therapy 2013   Urgency of urination     Past Surgical History:  Procedure Laterality Date   BREAST BIOPSY Left 12/15/2011   BREAST LUMPECTOMY Left 01/02/2012   COLONOSCOPY WITH PROPOFOL  N/A 11/01/2017   Procedure: COLONOSCOPY WITH PROPOFOL ;  Surgeon: Toledo, Ladell POUR, MD;  Location: [facility] ENDOSCOPY;  Service: Gastroenterology;  Laterality: N/A;   COLONOSCOPY WITH PROPOFOL  N/A 03/13/2023   Procedure: COLONOSCOPY WITH PROPOFOL ;  Surgeon: Maryruth Ole DASEN, MD;  Location: [facility] ENDOSCOPY;  Service: Endoscopy;  Laterality: N/A;   VAGINAL HYSTERECTOMY  1985    Prior to Admission medications  Medication Sig Start Date End Date Taking? Authorizing Provider  aspirin  EC 81 MG tablet Take 1 tablet (81 mg total) by mouth daily. 05/01/19  Yes End, Lonni, MD  bisoprolol  (ZEBETA ) 5 MG tablet TAKE 1/2 TABLET(2.5MG       TOTAL) DAILY 10/16/24  Yes End, Lonni, MD  Cholecalciferol (D3-1000 PO) Take 1 tablet by mouth daily.   Yes [provider]  Cyanocobalamin   (B-12) 100 MCG TABS Take 1 tablet by mouth daily.   Yes [provider]  hydrocortisone  2.5 % cream Apply once to twice daily as directed to aa face. 05/28/24  Yes Jackquline Sawyer, MD  ketoconazole  (NIZORAL ) 2 % cream Apply 1 Application topically 2 (two) times daily. 05/28/24  Yes Jackquline Sawyer, MD  metroNIDAZOLE  (METROCREAM ) 0.75 % cream Apply to cheeks, chin, nose once to twice daily for rosacea. 05/28/24  Yes Stewart, Tara, MD  oxybutynin (DITROPAN) 5 MG tablet Take 5 mg by mouth every 8 (eight) hours as needed.   Yes [provider]  simvastatin  (ZOCOR ) 40 MG tablet Take 1 tablet (40 mg total) by mouth at bedtime. 09/25/24 12/24/24 Yes End, Lonni, MD    Allergies as of 11/06/2024   (No Known Allergies)    Family History  Problem Relation Age of Onset   Heart disease Mother    Diabetes Mother    Alzheimer's disease Father    Arrhythmia Sister    Heart disease Sister    Heart attack Sister    Stroke Sister    Colon cancer Brother    Bladder Cancer Brother    COPD Brother    Heart disease Brother    Heart disease Brother    Heart attack Brother    Lung disease Brother    Colon cancer Brother    Stroke Brother    Breast cancer Maternal Aunt  17s   Breast cancer Other 42   Prostate cancer Nephew        metastatic   Prostate cancer Nephew        dx 26s   Kidney cancer Neg Hx     Social History   Socioeconomic History   Marital status: Married    Spouse name: Not on file   Number of children: Not on file   Years of education: Not on file   Highest education level: Not on file  Occupational History   Not on file  Tobacco Use   Smoking status: Former    Current packs/day: 0.00    Average packs/day: 0.3 packs/day for 2.0 years (0.5 ttl pk-yrs)    Types: Cigarettes    Start date: 10/31/1966    Quit date: 10/31/1968    Years since quitting: 56.0   Smokeless tobacco: Never  Vaping Use   Vaping status: Never Used  Substance and Sexual Activity    Alcohol use: Yes    Alcohol/week: 1.0 standard drink of alcohol    Types: 1 Glasses of wine per week    Comment: weekly   Drug use: Never   Sexual activity: Yes    Birth control/protection: Surgical, Post-menopausal  Other Topics Concern   Not on file  Social History Narrative   Not on file   Social Drivers of Health   Tobacco Use: Medium Risk (11/18/2024)   Received from [facility]   Patient History    Smoking Tobacco Use: Former    Smokeless Tobacco Use: Never    Passive Exposure: Past  Physicist, Medical Strain: Low Risk  (06/17/2024)   Received from [facility]   Overall Financial Resource Strain (CARDIA)    Difficulty of Paying Living Expenses: Not hard at all  Food Insecurity: No Food Insecurity (06/17/2024)   Received from [facility]   Epic    Within the past 12 months, you worried that your food would run out before you got the money to buy more.: Never true    Within the past 12 months, the food you bought just didn't last and you didn't have money to get more.: Never true  Transportation Needs: No Transportation Needs (06/17/2024)   Received from [facility] - Transportation    In the past 12 months, has lack of transportation kept you from medical appointments or from getting medications?: No    Lack of Transportation (Non-Medical): No  Physical Activity: Not on file  Stress: Not on file  Social Connections: Not on file  Intimate Partner Violence: Not on file  Depression (EYV7-0): Not on file  Alcohol Screen: Not on file  Housing: Low Risk  (06/17/2024)   Received from [facility]   Epic    In the last 12 months, was there a time when you were not able to pay the mortgage or rent on time?: No    In the past 12 months, how many times have you moved where you were living?: 0    At any time in the past 12 months, were you homeless or living in a shelter (including  now)?: No  Utilities: Not At Risk (06/17/2024)   Received from [facility]   Epic    In the past 12 months has the electric, gas, oil, or water company threatened to shut off services in your home?: No  Health Literacy: Not on file  Review of Systems: See HPI, otherwise negative ROS  Physical Exam: BP 110/66   Pulse 65   Temp 98.3 F (36.8 C)   Resp 15   Ht 5' 5 (1.651 m)   Wt 74.8 kg   SpO2 97%   BMI 27.46 kg/m  General:   Alert,  pleasant and cooperative in NAD Head:  Normocephalic and atraumatic. Lungs:  Clear to auscultation.    Heart:  Regular rate and rhythm.   Impression/Plan: Krystal Payne is here for ophthalmic surgery.  Risks, benefits, limitations, and alternatives regarding ophthalmic surgery have been reviewed with the patient.  Questions have been answered.  All parties agreeable.   MITTIE GASKIN, MD  11/20/2024, 11:11 AM  "

## 2024-11-20 NOTE — Transfer of Care (Signed)
 Immediate Anesthesia Transfer of Care Note  Patient: Krystal Payne  Procedure(s) Performed: PHACOEMULSIFICATION, CATARACT, WITH IOL INSERTION 3.76 00:33.5 (Right: Eye)  Patient Location: PACU  Anesthesia Type: MAC  Level of Consciousness: awake, alert  and patient cooperative  Airway and Oxygen Therapy: Patient Spontanous Breathing and Patient connected to supplemental oxygen  Post-op Assessment: Post-op Vital signs reviewed, Patient's Cardiovascular Status Stable, Respiratory Function Stable, Patent Airway and No signs of Nausea or vomiting  Post-op Vital Signs: Reviewed and stable  Complications: No notable events documented.

## 2024-11-20 NOTE — Op Note (Signed)
 LOCATION:  [facility]   PREOPERATIVE DIAGNOSIS:  Nuclear sclerotic cataract of the right eye.  H25.11   POSTOPERATIVE DIAGNOSIS:  Nuclear sclerotic cataract of the right eye.   PROCEDURE:  Phacoemulsification with Toric posterior chamber intraocular lens placement of the right eye.  Ultrasound time: Procedures: PHACOEMULSIFICATION, CATARACT, WITH IOL INSERTION 3.76 00:33.5 (Right)  LENS:   Implant Name Type Inv. Item Serial No. Manufacturer Lot No. LRB No. Used Action  Eychance TORIC II IOL 20.0 Intraocular Lens  [id number] SIGHTPATH  Right 1 Implanted     [id number] Toric intraocular lens with 2.25 diopters of cylindrical power with axis orientation at 39 degrees.   SURGEON:  Dene FABIENE Etienne, MD   ANESTHESIA: Topical with tetracaine  drops and 2% Xylocaine  jelly, augmented with 1% preservative-free intracameral lidocaine . .   COMPLICATIONS:  None.   DESCRIPTION OF PROCEDURE:  The patient was identified in the holding room and transported to the operating suite and placed in the supine position under the operating microscope.  The right eye was identified as the operative eye, and it was prepped and draped in the usual sterile ophthalmic fashion.    A clear-corneal paracentesis incision was made at the 12:00 position.  0.5 ml of preservative-free 1% lidocaine  was injected into the anterior chamber. The anterior chamber was filled with Viscoat.  A 2.4 millimeter near clear corneal incision was then made at the 9:00 position.  A cystotome and capsulorrhexis forceps were then used to make a curvilinear capsulorrhexis.  Hydrodissection and hydrodelineation were then performed using balanced salt solution.   Phacoemulsification was then used in stop and chop fashion to remove the lens, nucleus and epinucleus.  The remaining cortex was aspirated using the irrigation and aspiration handpiece.  Provisc viscoelastic was then placed into the capsular bag to distend it for lens  placement.  The Verion digital marker was used to align the implant at the intended axis.   A Toric lens was then injected into the capsular bag.  It was rotated clockwise until the axis marks on the lens were approximately 15 degrees in the counterclockwise direction to the intended alignment.  The viscoelastic was aspirated from the eye using the irrigation aspiration handpiece.  Then, a Koch spatula through the sideport incision was used to rotate the lens in a clockwise direction until the axis markings of the intraocular lens were lined up with the Verion alignment.  Balanced salt solution was then used to hydrate the wounds. Cefuroxime  0.1 ml of a 10mg /ml solution was injected into the anterior chamber for a dose of 1 mg of intracameral antibiotic at the completion of the case.    The eye was noted to have a physiologic pressure and there was no wound leak noted.   Timolol  and Brimonidine  drops were applied to the eye.  The patient was taken to the recovery room in stable condition having had no complications of anesthesia or surgery.  Krystal Payne 11/20/2024, 12:30 PM

## 2024-11-21 ENCOUNTER — Encounter: Payer: Self-pay | Admitting: Ophthalmology

## 2024-11-27 ENCOUNTER — Ambulatory Visit: Admitting: Internal Medicine

## 2024-11-28 ENCOUNTER — Ambulatory Visit
Admission: RE | Admit: 2024-11-28 | Discharge: 2024-11-28 | Disposition: A | Discharge status: Home / Self Care | Source: Ambulatory Visit | Attending: Family Medicine | Admitting: Family Medicine

## 2024-11-28 DIAGNOSIS — Z1231 Encounter for screening mammogram for malignant neoplasm of breast: Secondary | ICD-10-CM

## 2024-12-03 NOTE — Discharge Instructions (Signed)

## 2024-12-04 ENCOUNTER — Ambulatory Visit: Payer: Self-pay | Admitting: Anesthesiology

## 2024-12-04 ENCOUNTER — Other Ambulatory Visit: Payer: Self-pay

## 2024-12-04 ENCOUNTER — Encounter
Admission: RE | Disposition: A | Discharge status: Home / Self Care | Payer: Self-pay | Source: Home / Self Care | Attending: Ophthalmology

## 2024-12-04 ENCOUNTER — Ambulatory Visit
Admission: RE | Admit: 2024-12-04 | Discharge: 2024-12-04 | Disposition: A | Discharge status: Home / Self Care | Source: Home / Self Care | Attending: Ophthalmology | Admitting: Ophthalmology

## 2024-12-04 ENCOUNTER — Encounter: Payer: Self-pay | Admitting: Ophthalmology

## 2024-12-04 MED ORDER — SIGHTPATH DOSE#1 NA HYALUR & NA CHOND-NA HYALUR IO KIT
PACK | INTRAOCULAR | Status: DC | PRN
  Administered 2024-12-04: 1 via OPHTHALMIC

## 2024-12-04 MED ORDER — TETRACAINE HCL 0.5 % OP SOLN
OPHTHALMIC | Status: AC
  Filled 2024-12-04: qty 4

## 2024-12-04 MED ORDER — BRIMONIDINE TARTRATE-TIMOLOL 0.2-0.5 % OP SOLN
OPHTHALMIC | Status: DC | PRN
  Administered 2024-12-04: 1 [drp] via OPHTHALMIC

## 2024-12-04 MED ORDER — LACTATED RINGERS IV SOLN: INTRAVENOUS | Status: DC

## 2024-12-04 MED ORDER — TETRACAINE HCL 0.5 % OP SOLN
1.0000 [drp] | OPHTHALMIC | Status: DC | PRN
  Administered 2024-12-04 (×3): 1 [drp] via OPHTHALMIC

## 2024-12-04 MED ORDER — LIDOCAINE HCL (PF) 2 % IJ SOLN
INTRAOCULAR | Status: DC | PRN
  Administered 2024-12-04: 2 mL

## 2024-12-04 MED ORDER — SIGHTPATH DOSE#1 BSS IO SOLN
INTRAOCULAR | Status: DC | PRN
  Administered 2024-12-04: 15 mL via INTRAOCULAR

## 2024-12-04 MED ORDER — FENTANYL CITRATE (PF) 100 MCG/2ML IJ SOLN
INTRAMUSCULAR | Status: AC
  Filled 2024-12-04: qty 2

## 2024-12-04 MED ORDER — PHENYLEPHRINE HCL 10 % OP SOLN
OPHTHALMIC | Status: AC
  Filled 2024-12-04: qty 5

## 2024-12-04 MED ORDER — FENTANYL CITRATE (PF) 100 MCG/2ML IJ SOLN
INTRAMUSCULAR | Status: DC | PRN
  Administered 2024-12-04: 50 ug via INTRAVENOUS

## 2024-12-04 MED ORDER — MIDAZOLAM HCL 2 MG/2ML IJ SOLN
INTRAMUSCULAR | Status: AC
  Filled 2024-12-04: qty 2

## 2024-12-04 MED ORDER — SIGHTPATH DOSE#1 BSS IO SOLN
INTRAOCULAR | Status: DC | PRN
  Administered 2024-12-04: 59 mL via OPHTHALMIC

## 2024-12-04 MED ORDER — CYCLOPENTOLATE HCL 2 % OP SOLN
OPHTHALMIC | Status: AC
  Filled 2024-12-04: qty 2

## 2024-12-04 MED ORDER — MIDAZOLAM HCL (PF) 2 MG/2ML IJ SOLN
INTRAMUSCULAR | Status: DC | PRN
  Administered 2024-12-04: 2 mg via INTRAVENOUS

## 2024-12-04 MED ORDER — PHENYLEPHRINE HCL 10 % OP SOLN
1.0000 [drp] | OPHTHALMIC | Status: AC | PRN
  Administered 2024-12-04 (×3): 1 [drp] via OPHTHALMIC

## 2024-12-04 MED ORDER — CEFUROXIME OPHTHALMIC INJECTION 1 MG/0.1 ML
INJECTION | OPHTHALMIC | Status: DC | PRN
  Administered 2024-12-04: 1 mg via INTRACAMERAL

## 2024-12-04 MED ORDER — CYCLOPENTOLATE HCL 2 % OP SOLN
1.0000 [drp] | OPHTHALMIC | Status: AC | PRN
  Administered 2024-12-04 (×3): 1 [drp] via OPHTHALMIC

## 2024-12-04 NOTE — Transfer of Care (Signed)
 Immediate Anesthesia Transfer of Care Note  Patient: Krystal Payne  Procedure(s) Performed: PHACOEMULSIFICATION, CATARACT, WITH IOL INSERTION 3.34 00:21.3 (Left)  Patient Location: PACU  Anesthesia Type: General  Level of Consciousness: awake, alert  and patient cooperative  Airway and Oxygen Therapy: Patient Spontanous Breathing and Patient connected to supplemental oxygen  Post-op Assessment: Post-op Vital signs reviewed, Patient's Cardiovascular Status Stable, Respiratory Function Stable, Patent Airway and No signs of Nausea or vomiting  Post-op Vital Signs: Reviewed and stable  Complications: No notable events documented.

## 2024-12-04 NOTE — Op Note (Signed)
 OPERATIVE NOTE  Krystal Payne [id number] 12/04/2024   PREOPERATIVE DIAGNOSIS:  Nuclear sclerotic cataract left eye. H25.12   POSTOPERATIVE DIAGNOSIS:    Nuclear sclerotic cataract left eye.     PROCEDURE:  Phacoemusification with posterior chamber intraocular lens placement of the left eye  Ultrasound time: Procedures: PHACOEMULSIFICATION, CATARACT, WITH IOL INSERTION 3.34 00:21.3 (Left)  LENS:   Implant Name Type Inv. Item Serial No. Manufacturer Lot No. LRB No. Used Action  LENS IOL TECNIS EYHANCE 19.0 - [id number] Intraocular Lens LENS IOL TECNIS EYHANCE 19.0 [id number] SIGHTPATH  Left 1 Implanted      SURGEON:  Dene FABIENE Etienne, MD   ANESTHESIA:  Topical with tetracaine  drops and 2% Xylocaine  jelly, augmented with 1% preservative-free intracameral lidocaine .    COMPLICATIONS:  None.   DESCRIPTION OF PROCEDURE:  The patient was identified in the holding room and transported to the operating room and placed in the supine position under the operating microscope.  The left eye was identified as the operative eye and it was prepped and draped in the usual sterile ophthalmic fashion.   A 1 millimeter clear-corneal paracentesis was made at the 1:30 position.  0.5 ml of preservative-free 1% lidocaine  was injected into the anterior chamber.  The anterior chamber was filled with Viscoat viscoelastic.  A 2.4 millimeter keratome was used to make a near-clear corneal incision at the 10:30 position.  .  A curvilinear capsulorrhexis was made with a cystotome and capsulorrhexis forceps.  Balanced salt solution was used to hydrodissect and hydrodelineate the nucleus.   Phacoemulsification was then used in stop and chop fashion to remove the lens nucleus and epinucleus.  The remaining cortex was then removed using the irrigation and aspiration handpiece. Provisc was then placed into the capsular bag to distend it for lens placement.  A lens was then injected into the capsular bag.  The  remaining viscoelastic was aspirated.   Wounds were hydrated with balanced salt solution.  The anterior chamber was inflated to a physiologic pressure with balanced salt solution.  No wound leaks were noted. Cefuroxime  0.1 ml of a 10mg /ml solution was injected into the anterior chamber for a dose of 1 mg of intracameral antibiotic at the completion of the case.   Timolol  and Brimonidine  drops were applied to the eye.  The patient was taken to the recovery room in stable condition without complications of anesthesia or surgery.  Krystal Payne 12/04/2024, 11:00 AM

## 2024-12-04 NOTE — H&P (Signed)
 " [facility]   Primary Care Physician:  Glover Lenis, MD Ophthalmologist: Dr. Dene Etienne  Pre-Procedure History & Physical: HPI:  Krystal Payne is a 79 y.o. female here for ophthalmic surgery.   Past Medical History:  Diagnosis Date   Actinic keratosis    Arm fracture, left 1969   Arthritis    Basal cell carcinoma 05/11/2009   mid upper sternum   Basal cell carcinoma 12/10/2018   right upper nasolabial, excised 01/14/2019   Basal cell carcinoma 07/07/2020   Left mid upper back, [facility]   Basal cell carcinoma (BCC) of back 01/21/2020   L upper back, [facility]   Breast cancer (HCC)    left breast   Coronary artery disease involving native coronary artery of native heart without angina pectoris 08/05/2021   Dysrhythmia    tachycardia- no episodes in 2 years   History of kidney stones    History of radiation therapy 01/25/12 - 03/09/12   left breast   Hyperlipidemia    Personal history of radiation therapy 2013   Urgency of urination     Past Surgical History:  Procedure Laterality Date   BREAST BIOPSY Left 12/15/2011   BREAST LUMPECTOMY Left 01/02/2012   CATARACT EXTRACTION W/PHACO Right 11/20/2024   Procedure: PHACOEMULSIFICATION, CATARACT, WITH IOL INSERTION 3.76 00:33.5;  Surgeon: Etienne Dene, MD;  Location: [facility];  Service: Ophthalmology;  Laterality: Right;   COLONOSCOPY WITH PROPOFOL  N/A 11/01/2017   Procedure: COLONOSCOPY WITH PROPOFOL ;  Surgeon: Toledo, Ladell POUR, MD;  Location: [facility];  Service: Gastroenterology;  Laterality: N/A;   COLONOSCOPY WITH PROPOFOL  N/A 03/13/2023   Procedure: COLONOSCOPY WITH PROPOFOL ;  Surgeon: Maryruth Ole DASEN, MD;  Location: [facility];  Service: Endoscopy;  Laterality: N/A;   VAGINAL HYSTERECTOMY  1985    Prior to Admission medications  Medication Sig Start Date End Date Taking? Authorizing Provider  aspirin  EC 81 MG tablet Take 1 tablet (81 mg total) by mouth daily. 05/01/19  Yes End,  Lonni, MD  bisoprolol  (ZEBETA ) 5 MG tablet TAKE 1/2 TABLET(2.5MG       TOTAL) DAILY 10/16/24  Yes End, Lonni, MD  Cholecalciferol (D3-1000 PO) Take 1 tablet by mouth daily.   Yes [provider]  Cyanocobalamin  (B-12) 100 MCG TABS Take 1 tablet by mouth daily.   Yes [provider]  hydrocortisone  2.5 % cream Apply once to twice daily as directed to aa face. 05/28/24  Yes Jackquline Sawyer, MD  ketoconazole  (NIZORAL ) 2 % cream Apply 1 Application topically 2 (two) times daily. 05/28/24  Yes Jackquline Sawyer, MD  metroNIDAZOLE  (METROCREAM ) 0.75 % cream Apply to cheeks, chin, nose once to twice daily for rosacea. 05/28/24  Yes Stewart, Tara, MD  oxybutynin (DITROPAN) 5 MG tablet Take 5 mg by mouth every 8 (eight) hours as needed.   Yes [provider]  simvastatin  (ZOCOR ) 40 MG tablet Take 1 tablet (40 mg total) by mouth at bedtime. 09/25/24 12/24/24 Yes End, Lonni, MD    Allergies as of 11/06/2024   (No Known Allergies)    Family History  Problem Relation Age of Onset   Heart disease Mother    Diabetes Mother    Alzheimer's disease Father    Arrhythmia Sister    Heart disease Sister    Heart attack Sister    Stroke Sister    Colon cancer Brother    Bladder Cancer Brother    COPD Brother    Heart disease Brother    Heart disease Brother  Heart attack Brother    Lung disease Brother    Colon cancer Brother    Stroke Brother    Breast cancer Maternal Aunt        40s   Breast cancer Other 42   Prostate cancer Nephew        metastatic   Prostate cancer Nephew        dx 72s   Kidney cancer Neg Hx     Social History   Socioeconomic History   Marital status: Married    Spouse name: Not on file   Number of children: Not on file   Years of education: Not on file   Highest education level: Not on file  Occupational History   Not on file  Tobacco Use   Smoking status: Former    Current packs/day: 0.00    Average packs/day: 0.3 packs/day  for 2.0 years (0.5 ttl pk-yrs)    Types: Cigarettes    Start date: 10/31/1966    Quit date: 10/31/1968    Years since quitting: 56.1   Smokeless tobacco: Never  Vaping Use   Vaping status: Never Used  Substance and Sexual Activity   Alcohol use: Yes    Alcohol/week: 1.0 standard drink of alcohol    Types: 1 Glasses of wine per week    Comment: weekly   Drug use: Never   Sexual activity: Yes    Birth control/protection: Surgical, Post-menopausal  Other Topics Concern   Not on file  Social History Narrative   Not on file   Social Drivers of Health   Tobacco Use: Medium Risk (12/04/2024)   Patient History    Smoking Tobacco Use: Former    Smokeless Tobacco Use: Never    Passive Exposure: Not on file  Financial Resource Strain: Low Risk  (06/17/2024)   Received [facility]   Overall Financial Resource Strain (CARDIA)    Difficulty of Paying Living Expenses: Not hard at all  Food Insecurity: No Food Insecurity (06/17/2024)   Received from [facility]   Epic    Within the past 12 months, you worried that your food would run out before you got the money to buy more.: Never true    Within the past 12 months, the food you bought just didn't last and you didn't have money to get more.: Never true  Transportation Needs: No Transportation Needs (06/17/2024)   Received from [facility]    In the past 12 months, has lack of transportation kept you from medical appointments or from getting medications?: No    Lack of Transportation (Non-Medical): No  Physical Activity: Not on file  Stress: Not on file  Social Connections: Not on file  Intimate Partner Violence: Not on file  Depression (EYV7-0): Not on file  Alcohol Screen: Not on file  Housing: Low Risk  (06/17/2024)   Received from [facility]   Epic    In the last 12 months, was there a time when you were not able to pay the mortgage or  rent on time?: No    In the past 12 months, how many times have you moved where you were living?: 0    At any time in the past 12 months, were you homeless or living in a shelter (including now)?: No  Utilities: Not At Risk (06/17/2024)   Received from [facility]   Epic    In the  past 12 months has the electric, gas, oil, or water company threatened to shut off services in your home?: No  Health Literacy: Not on file    Review of Systems: See HPI, otherwise negative ROS  Physical Exam: BP (!) 104/58   Pulse 67   Temp 97.6 F (36.4 C) (Temporal)   Resp 12   Ht 5' 5 (1.651 m)   Wt 78 kg   SpO2 96%   BMI 28.62 kg/m  General:   Alert,  pleasant and cooperative in NAD Head:  Normocephalic and atraumatic. Lungs:  Clear to auscultation.    Heart:  Regular rate and rhythm.   Impression/Plan: CHRISTOL THETFORD is here for ophthalmic surgery.  Risks, benefits, limitations, and alternatives regarding ophthalmic surgery have been reviewed with the patient.  Questions have been answered.  All parties agreeable.   MITTIE GASKIN, MD  12/04/2024, 9:45 AM  "

## 2024-12-04 NOTE — Anesthesia Postprocedure Evaluation (Signed)
"   Anesthesia Post Note  Patient: Krystal Payne  Procedure(s) Performed: PHACOEMULSIFICATION, CATARACT, WITH IOL INSERTION 3.34 00:21.3 (Left)  Patient location during evaluation: PACU Anesthesia Type: General Level of consciousness: awake and alert Pain management: pain level controlled Vital Signs Assessment: post-procedure vital signs reviewed and stable Respiratory status: spontaneous breathing, nonlabored ventilation, respiratory function stable and patient connected to nasal cannula oxygen Cardiovascular status: blood pressure returned to baseline and stable Postop Assessment: no apparent nausea or vomiting Anesthetic complications: no   No notable events documented.   Last Vitals:  Vitals:   12/04/24 0939  BP: (!) 104/58  Pulse: 67  Resp: 12  Temp: 36.4 C  SpO2: 96%    Last Pain:  Vitals:   12/04/24 0939  TempSrc: Temporal  PainSc: 0-No pain                 [name]      "

## 2024-12-04 NOTE — Anesthesia Preprocedure Evaluation (Signed)
"                                    Anesthesia Evaluation  Patient identified by MRN, date of birth, ID band Patient awake    Reviewed: Allergy & Precautions, H&P , NPO status , Patient's Chart, lab work & pertinent test results  Airway Mallampati: II  TM Distance: >3 FB Neck ROM: Full    Dental no notable dental hx.    Pulmonary neg pulmonary ROS, former smoker   Pulmonary exam normal breath sounds clear to auscultation       Cardiovascular negative cardio ROS Normal cardiovascular exam Rhythm:Regular Rate:Normal     Neuro/Psych negative neurological ROS  negative psych ROS   GI/Hepatic negative GI ROS, Neg liver ROS,,,  Endo/Other  negative endocrine ROS    Renal/GU negative Renal ROS  negative genitourinary   Musculoskeletal negative musculoskeletal ROS (+)    Abdominal   Peds negative pediatric ROS (+)  Hematology negative hematology ROS (+)   Anesthesia Other Findings   Reproductive/Obstetrics negative OB ROS                              Anesthesia Physical Anesthesia Plan  ASA: 2  Anesthesia Plan: General   Post-op Pain Management:    Induction: Intravenous  PONV Risk Score and Plan:   Airway Management Planned:   Additional Equipment:   Intra-op Plan:   Post-operative Plan: Extubation in OR  Informed Consent: I have reviewed the patients History and Physical, chart, labs and discussed the procedure including the risks, benefits and alternatives for the proposed anesthesia with the patient or authorized representative who has indicated his/her understanding and acceptance.     Dental advisory given  Plan Discussed with: CRNA  Anesthesia Plan Comments:         Anesthesia Quick Evaluation  "

## 2024-12-19 ENCOUNTER — Ambulatory Visit: Admitting: Internal Medicine

## 2025-03-10 ENCOUNTER — Ambulatory Visit: Admitting: Dermatology

## 2025-10-01 ENCOUNTER — Ambulatory Visit: Admitting: Urology
